# Patient Record
Sex: Male | Born: 1970 | Race: Black or African American | Hispanic: No | Marital: Single | Smoking: Current every day smoker
Health system: Southern US, Community
[De-identification: ages and names within clinical notes are randomized; demographics above are authoritative.]

## PROBLEM LIST (undated history)

## (undated) DIAGNOSIS — K219 Gastro-esophageal reflux disease without esophagitis: Secondary | ICD-10-CM

## (undated) DIAGNOSIS — I10 Essential (primary) hypertension: Secondary | ICD-10-CM

## (undated) DIAGNOSIS — E785 Hyperlipidemia, unspecified: Secondary | ICD-10-CM

## (undated) DIAGNOSIS — F1210 Cannabis abuse, uncomplicated: Secondary | ICD-10-CM

## (undated) HISTORY — PX: KNEE SURGERY: SHX244

---

## 2000-01-26 ENCOUNTER — Inpatient Hospital Stay (HOSPITAL_COMMUNITY): Admission: AD | Admit: 2000-01-26 | Discharge: 2000-01-31 | Payer: Self-pay | Admitting: *Deleted

## 2002-04-03 ENCOUNTER — Ambulatory Visit (HOSPITAL_COMMUNITY): Admission: RE | Admit: 2002-04-03 | Discharge: 2002-04-03 | Payer: Self-pay | Admitting: Preventative Medicine

## 2002-04-03 ENCOUNTER — Encounter: Payer: Self-pay | Admitting: Preventative Medicine

## 2002-07-31 ENCOUNTER — Ambulatory Visit (HOSPITAL_COMMUNITY): Admission: RE | Admit: 2002-07-31 | Discharge: 2002-07-31 | Payer: Self-pay | Admitting: Internal Medicine

## 2003-06-04 ENCOUNTER — Ambulatory Visit (HOSPITAL_COMMUNITY): Admission: RE | Admit: 2003-06-04 | Discharge: 2003-06-04 | Payer: Self-pay | Admitting: Orthopaedic Surgery

## 2003-06-15 ENCOUNTER — Ambulatory Visit (HOSPITAL_COMMUNITY): Admission: RE | Admit: 2003-06-15 | Discharge: 2003-06-15 | Payer: Self-pay | Admitting: Orthopaedic Surgery

## 2007-06-16 ENCOUNTER — Emergency Department (HOSPITAL_COMMUNITY): Admission: EM | Admit: 2007-06-16 | Discharge: 2007-06-16 | Payer: Self-pay | Admitting: Emergency Medicine

## 2009-05-13 ENCOUNTER — Encounter: Admission: RE | Admit: 2009-05-13 | Discharge: 2009-05-13 | Payer: Self-pay | Admitting: Gastroenterology

## 2009-05-19 ENCOUNTER — Encounter: Admission: RE | Admit: 2009-05-19 | Discharge: 2009-05-19 | Payer: Self-pay | Admitting: Gastroenterology

## 2009-06-02 ENCOUNTER — Ambulatory Visit (HOSPITAL_COMMUNITY): Admission: RE | Admit: 2009-06-02 | Discharge: 2009-06-02 | Payer: Self-pay | Admitting: Gastroenterology

## 2010-05-20 ENCOUNTER — Encounter: Payer: Self-pay | Admitting: Orthopaedic Surgery

## 2010-09-15 NOTE — H&P (Signed)
[facility]  Patient:    Danny Vargas, Danny Vargas                         MRN: [id number] Adm. Date:  [id number] Attending:  Otilio Saber Dictator:   Landry Corporal, N.P.                         History and Physical  ADDENDUM:  PHYSICAL EXAMINATION:  Head: Normocephalic, able to raise eyebrows.  Eyes are equal and reactive to light.  EOMs are intact.  Funduscopic exam: Difficult to visualize.  ENT: External ear canals are patent.  TMs are intact.  Nostrils are patent bilaterally, no sinus tenderness.  Mouth: Mucosa is moist, good dentition, no lesions were seen.  Tongue protrudes to midline without tremor. He can clench his teeth and puff out cheeks.  No pharyngeal exudate was noted. Neck is supple with full range of motion, no JVD, negative lymphadenopathy. Thyroid is nonpalpable, not enlarged.  Trachea is midline.  Respiratory: Clear to auscultation, no adventitious sounds.  Heart rate: Regular rate and rhythm, no murmurs heard.  Carotid pulses: Equal and adequate, no carotid bruits were auscultated.  Abdomen: Flat, soft, nontender abdomen, no masses or organomegaly, active bowel sounds in all four quadrants, no CVA tenderness. Muscular: No joint swelling or deformity.  Gait is normal.  Good range of motion.  Muscle strength and tone is equal bilaterally.  Skin is warm and dry with good turgor.  Nail beds are pink with good capillary refill.  Neurologic: Oriented x 3.  Cranial nerves are grossly intact.  Deep tendon reflexes are 2+.  Good grip strength bilaterally, no involuntary movement.  Gait is normal. Romberg is negative. DD:  01/29/00 TD:  01/29/00 Job: [id number]

## 2010-09-15 NOTE — H&P (Signed)
[facility]  Patient:    Danny Vargas, Danny Vargas                         MRN: [id number] Adm. Date:  [id number] Attending:  Otilio Saber Dictator:   Landry Corporal, N.P.                         History and Physical  INCOMPLETE REPORT  REASON FOR ADMISSION:  Admitted on 01/27/00 for detoxification of alcohol.  REVIEW OF SYSTEMS:  In general, the patient complains of no fevers or chills. Reports occasionally loses weight due to his diabetes.  Eyes: No blurred or double vision.  It has been several years since he has had an eye exam.  He does not wear glasses.  No glaucoma or cataract.  ENT and mouth: No earache, hearing loss, or vertigo.  Cardiovascular: No chest pain, palpitations, or pressure.  No syncope or sweating.  No history of hypertension.  He smokes one pack a day since the age of 73.  Respiratory: No cough, no shortness of breath, no dyspnea or orthopnea.  GI: No heartburn, hemorrhoids, blood in stool, or constipation.  GU: No dysuria, frequency, or hematuria.  No penile discharge.  Musculoskeletal: No stiffness, swelling, or joint pain.  Skin: No dryness, rash, or bruising.  Neurologic: No weakness, tremor, or memory loss. Psychiatric: Reports some depression.  Endocrine: History of diabetes type 1, on insulin for the past two years.  Lymph: No enlarged or tender lymph nodes. Allergies: No abnormal sneezing or runny nose.  PHYSICAL EXAMINATION:  Vital signs: Temperature 97, heart rate 84, respiratory rate 20, blood pressure 115/74, height 6 feet 2 inches, weight 147.5. General: A 40 year old black male sitting on the exam table, no acute distress, somewhat thin in stature but appears stated age.  Well groomed, alert, and cooperative. DD:  01/29/00 TD:  01/29/00 Job: [id number]

## 2010-09-15 NOTE — Discharge Summary (Signed)
[facility]  Patient:    Danny Vargas, Danny Vargas                         MRN: [id number] Adm. Date:  [id number] Disc. Date: [id number] Attending:  Otilio Vargas Dictator:   Danny Moloney, NP                           Discharge Summary  HISTORY OF PRESENT ILLNESS:  Mr. Danny Vargas is a 40 year old single black male admitted on a referral from [facility] with a history of alcohol dependence.  Patient reports drinking fairly heavily over the past four months, drinking up to several 40-oz beers a day, or up to a 12-pack a day.  He reports some occasional blackouts, but denies any seizures or hallucinations.  He was quite fearful of withdrawal symptoms, especially with his diabetes.  He denies sleep or appetite disturbance.  He has had some recent depression, but currently denies being depressed.  PAST PSYCHIATRIC HISTORY:  Patient was treated at [facility] in 1998.  Last detox October at [facility].  He apparently has not followed up with AA and no history of being on psychotropic medications.  PAST MEDICAL HISTORY:  Patients primary care physician is Dr. Valrie Vargas in [facility] [place].  Medical problems include diabetes mellitus type 2, with insulin.  Admission medications:  Lantis insulin 10 units a.c. breakfast and Humulin on a sliding scale.  He denied being on any other medications.  DRUG ALLERGIES:  No known drug allergies.  PHYSICAL EXAMINATION:  Review of systems was done in the hospital and it was negative, with no significant findings.  MENTAL STATUS EXAMINATION:  On admission, patient presents as a casually dressed, tall, thin black male.  Speech is normal.  Thought processes are logical and coherent without evidence of psychosis.  He denies suicidal ideation.  Mood is mildly depressed.  Affect is somewhat blunted but within normal range.  Oriented x 3.  Cognitive function is intact.  ADMITTING DIAGNOSES: Axis I:      Alcohol dependence. Axis II:    No diagnosis. Axis III:   Diabetes mellitus type 2. Axis IV:    Psychosocial stressors none. Axis V:     Global assessment of function 45, highest past year 70.  LABORATORY DATA:  CBC with diff was within normal limits, with the exception of white blood count increased at 11.3.  His CMET was within normal limits with the exception of glucose elevated at 395 and his ALP high at 123.  His thyroid profile was within normal limits.  His blood alcohol level was 133, elevated, and his urine drug screen was positive for cocaine.  His urinalysis showed a glucose of over 1000.  HOSPITAL COURSE:  The patient was admitted to the hospital for his alcohol dependence.  While he was in the hospital, he felt that he simply needed detox, and he reported doing fairly well today, sleeping well, no tremors. His blood sugars had been poorly controlled and patient was on a strict regime at home, and we did change the sliding scale and continued to detox him on a low dose Librium protocol and gave him Lantis insulin 10 units a.c. breakfast and with the note to not mix with a sliding scale.  Sliding scale was 100-150 for 2 units of regular, 150-200 three units of regular, 201-150 four units regular, 251-300 five units of regular.  Over 300 we would give him six units of regular insulin.  We did also consult with Dr. Cato Vargas for diabetes management and elevated blood sugars.  He was subsequently placed on a 2000 ADA diet with no concentrated sweets with a mid-morning snack and a late evening snack with diabetes teaching.  Again, we changed him to Lantis insulin and a new sliding scale was instituted.  The Lantis insulin was long acting and then we did have a sliding scale with Humalog and we also started on Actos 15 mg one p.o. q.d. and then also had a glucometer at his bedside and taught him how to use that.  While he was in the hospital, he did well on the detox. He had more  difficulty with his diabetes.  On day of discharge, he had no withdrawal symptoms.  He was sleeping and eating well.  Blood sugar still showed poor control.  Mood and affect were bright.  There were no suicidal thoughts.  It was felt that he could be managed safely on an outpatient basis.  CONDITION ON DISCHARGE:  Patient discharged in improved condition, with successful detox from alcohol.  His mood, sleep and appetite seemed improved. There was no suicidal ideation.  He seemed to have energy.  The main difficulty that he had was with getting his diabetes under control.  DISPOSITION:  Patient is discharged home.  FOLLOW UP:  Patient was to follow up at [facility] October 9 at 1:30, to see Danny Vargas.  He is also to schedule an appointment immediately with his doctor who treats his diabetes, ASAP, and we instructed him to go home on a 2000 Kcal ADA diet.  Patient was agreeable to treatment plan.  DISCHARGE MEDICATIONS: 1. Actos 15 one tablet daily. 2. Lantis insulin 10 units q.a.m. with a sliding scale insulin of Humulog,    151-200 two units, 201-250 four units, 251-300 six units, 301-350 eight    units, and 351-400 nine units.  FINAL DIAGNOSIS: Axis I:     Alcohol dependence. Axis II:    No diagnosis. Axis III:   Insulin-dependent diabetes mellitus. Axis IV:     Psychosocial stressors none. Axis V:     Global assessment of function current 55, highest past year 70. DD:  02/07/00 TD:  02/07/00 Job: [id number]

## 2010-09-15 NOTE — Consult Note (Signed)
NAME:  Danny Vargas, Danny Vargas                            ACCOUNT NO.:  [id number]   MEDICAL RECORD NO.:  [id number]                   PATIENT TYPE:   LOCATION:                                       FACILITY:   PHYSICIAN:  R. Roetta Sessions, M.D.              DATE OF BIRTH:  [birth year]   DATE OF CONSULTATION:  07/22/2002  DATE OF DISCHARGE:                                   CONSULTATION   REQUESTING PHYSICIAN:  Dr. Chilton Si   REASON FOR CONSULTATION:  Rectal bleeding.   HISTORY OF PRESENT ILLNESS:  [name] is a 40 year old black gentleman with  insulin-dependent diabetes mellitus who presents today for further  evaluation of rectal bleeding.  He has seen bright red blood per rectum  noted on the toilet tissue on several occasions.  The last time was about 4  weeks ago. He has had this off and on over the last year or so.  He had an  episode similar to this 8 years ago where he had a larger amount of  hematochezia.  He had an endoscopy (it sounds like a flexible sigmoidoscopy)  8 years ago and was told that he had hemorrhoids.  The patient states that  he occasionally has constipation.  When he has to strain he has noted some  bright red blood on the toilet tissue.  However, he usually has a bowel  movement daily.  Denies any diarrhea, melena, abdominal pain, nausea or  vomiting.  He rarely has heartburn.  He denies any dysphagia or odynophagia.  He says that his sugars are well-controlled.  He denies any weight loss.   CURRENT MEDICATIONS:  1. Lantus 18 units q.a.m.  2. NovoLog sliding scale insulin.  3. Floxin 1 b.i.d.   ALLERGIES:  No known drug allergies.   PAST MEDICAL HISTORY:  1. Insulin-dependent diabetes mellitus.  2. Currently being treated for prostatitis.  3. Was exposed to trichomonas.   FAMILY HISTORY:  Mother has thyroid disease.  Diabetes runs in his mother's  side of the family.  He has 6 aunt and uncles with diabetes.  No family  history of colorectal cancer or  inflammatory bowel disease.   SOCIAL HISTORY:  He is single.  He has 1 child.  He works for [employer].  He smokes 1 pack of cigarettes daily, has smoked for over 16 years.  Denies any alcohol use.   REVIEW OF SYSTEMS:  GENERAL:  Please see HPI for GI.  CARDIOPULMONARY:  Denies any shortness of breath or chest pain.   PHYSICAL EXAMINATION:  VITAL SIGNS:  Weight 153.  Height 6 feet 2 inches.  Temperature 97.6, blood pressure 110/70, pulse 66  GENERAL:  A pleasant, thin, black male, in no acute distress.  SKIN:  Warm and dry.  No jaundice.  HEENT:  Pupils are equal, round, and reacted to light.  Conjunctivae are  pink.  Sclerae anicteric.  Oropharyngeal mucosa moist and pink.  No lesions,  erythema or exudate.  No lymphadenopathy or thyromegaly.  CHEST:  Lungs are clear to auscultation.  CARDIOVASCULAR:  Cardiac exam reveals regular rate and rhythm.  Normal S1,  S2.  No murmurs, rubs, or gallops.  ABDOMEN:  Positive bowel sounds, flat, nondistended, nontender.  No  organomegaly or masses.  EXTREMITIES:  No edema.   IMPRESSION:  _________ hematochezia which may be from benign anorectal  source such as an anal fissure or hemorrhoids.  Given that he continues to  have intermittent rectal bleeding and probably and most likely has not had a  complete colonoscopy I have recommended one at this time.  I have discussed  the risks, alternatives, benefits with the patient and he is agreeable to  proceed.   PLAN:  1. Colonoscopy in the near future.  2. Further recommendations to follow.   I would like to thank to Dr. Chilton Si for allowing [place] to take part in the care  of this patient.     Tana Coast, Pricilla Larsson, M.D.    LL/MEDQ  D:  07/22/2002  T:  07/23/2002  Job:  [id number]   cc:   Dr. Chilton Si

## 2010-09-15 NOTE — Op Note (Signed)
NAME:  Danny Vargas, CIAVARELLA                          ACCOUNT NO.:  [id number]   MEDICAL RECORD NO.:  [id number]                   PATIENT TYPE:  AMB   LOCATION:  DAY                                  FACILITY:  [facility]   PHYSICIAN:  J. Darreld Mclean, M.D.              DATE OF BIRTH:  Apr 28, 1971   DATE OF PROCEDURE:  DATE OF DISCHARGE:                                 OPERATIVE REPORT   PREOPERATIVE DIAGNOSES:  Tear of the medial meniscus, right knee.   POSTOPERATIVE DIAGNOSES:  Tear of the medial meniscus, right knee.   PROCEDURE:  Operative arthroscopy, partial medial meniscectomy right knee.   ANESTHESIA:  General.   TOURNIQUET TIME:  20 minutes.   SURGEON:  J. Darreld Mclean, M.D.   ASSISTANT:  Candace Cruise, P.A.   DRAINS:  None.   INDICATIONS FOR PROCEDURE:  The patient is a 40 year old male with pain and  tenderness in his right knee.  MRI shows a tear of the medial meniscus of  the right knee.  The pain and tenderness in the knee is not improving.  Laverle Hobby, M.D. __________ the patient and I saw him on January 28.  Surgery was delayed pending results of MRI of his back. He was also having  significant back pain. Proceeding with the knee surgery risks and  imponderables were discussed with the patient preoperatively.   FINDINGS:  Suprapatellar pouch was normal and the surface of the patella was  normal medially.  The articular surfaces had grade 1 to grade 2 changes.  There was a tear in the posterior horn of the medial meniscus, stellate  tear, anterior cruciate was intact, laterally the articular surfaces looked  normal, there was no loose bodies.  The gutters were negative.   The patient was seen and the right knee was designated by him as the site we  were doing the surgery.  He put his initial, I put my initial which was  reconfirmed by the history and physical and other information in the  hospital record.   The patient was then taken to the operating room and  general anesthesia  given.  Supine on the operating room table, tourniquet placed, deflated left  upper thigh and a leg holder was attached. The patient was prepped and  draped in the usual manner. We reascertained we were doing on his right leg  arthroscopy.  The leg was elevated, wrapped circumferentially with esmarch  bandage, tourniquet inflated to 300 mmHg.  Esmarch bandage removed.  Inflow  cannula inserted, lactated Ringer's inserted to the knee by an infusion  pump. Arthroscope was inserted.  We had difficulty in viewing,  we could not  get the arthroscope focused at all.  Various maneuvers were done.  We  changed up the scope and the new scope worked and the first scope was later  examined and I was told before the case was over that there  was a problem  with the original scope that needed to be repaired. The second scope worked  very well and we could see extremely well with the high definition digital  equipment present.  Meniscal tear was readily identified please see in  findings above.  Permanent pictures were taken.  Using a meniscal punch,  meniscal shaver, good smooth contour was obtained, the meniscus was removed.  He was systematically reexamined, no new pathology found.  The wound was  reapproximated using  3-0 nylon in interrupted vertical mattress manner,  Marcaine 0.25 instilled into each portal.  The tourniquet deflated after 20  minutes, sterile dressing applied, bulky dressing applied, knee immobilizer  applied. The patient went to recovery in good condition.  A prescription of  Vicodin ES given for pain.  I will see him in the office in approximately 10  days to 2 weeks, physical therapy has been arranged.  If he has difficulties  he is to contact me through the office or hospital beeper system, numbers  have been provided.      ___________________________________________                                            Teola Bradley, M.D.   JWK/MEDQ  D:   06/15/2003  T:  06/15/2003  Job:  [id number]   cc:   Laverle Hobby, M.D.

## 2010-09-15 NOTE — Op Note (Signed)
NAME:  Danny Vargas, Danny Vargas                            ACCOUNT NO.:  [id number]   MEDICAL RECORD NO.:  [id number]                   PATIENT TYPE:  AMB   LOCATION:  DAY                                  FACILITY:  [facility]   PHYSICIAN:  R. Roetta Sessions, M.D.              DATE OF BIRTH:  Oct 16, 1970   DATE OF PROCEDURE:  07/31/2002  DATE OF DISCHARGE:                                  PROCEDURE NOTE   COLONOSCOPY   INDICATIONS FOR PROCEDURE:  The patient is a 40 year old gentleman with  hematochezia intermittently.  A colonoscopy is now being done to further  evaluate his symptoms.  Procedure has been discussed with the patient  previously and again today at the bedside the potential risks, benefits and  alternatives have been reviewed, questions answered and he is agreeable.  Please see my dictated H&P, 07/22/2002.   PROCEDURE NOTE:  O2 saturation, blood pressure, pulse and respirations were  monitored throughout.  Received conscious sedation with Versed 2 mg IV,  Demerol 50 mg IV.   INSTRUMENT:  Olympus video chip colonoscope.   FINDINGS:  Digital rectal examination revealed no abnormalities.   Endoscopic findings:  Prep was good.   Rectal and colon examination:  Rectal mucosa including retroflex view of  anal verge and __________ view of the anal canal demonstrated friable anal  canal hemorrhoids.  Otherwise, rectal mucosa appeared normal.   Colon:  The colonic mucosa was surveyed from the rectosigmoid junction,  through the left transverse, right colon to the area of appendiceal orifice,  ileocecal valve and cecum.  These structures were well seen.   The ileocecal valve appeared to be somewhat inflamed and friable.  There  were no erosions or ulcers.  It was biopsied x 2.  The terminal ileum was  embedded at 10 cm.  This segment of the GI tract appeared normal from this  level.  The scope was slowly withdrawn.  All previously-mentioned mucosal  surfaces were again seen.  No other  abnormalities were observed.  The  patient tolerated the procedure well.   ENDOSCOPY IMPRESSION:  1. Friable anal canal hemorrhoids, otherwise normal rectum.  2. Normal colon except for friable inflamed-appearing ileocecal valve of     doubtful clinical significance, biopsied.  Terminal ileum otherwise     appeared normal.   Suspect the patient had been bleeding from hemorrhoids.   RECOMMENDATIONS:  1. Hemorrhoid literature provided to [name].  2.     A 10-day course of Anusol suppositories, Crystallose 20 grams orally daily     on a p.r.n. basis for constipation.  3. Follow up on path.  4. Further recommendations to follow.  [name], M.D.    RMR/MEDQ  D:  07/31/2002  T:  08/01/2002  Job:  [id number]   cc:   Dr. Chilton Si

## 2010-09-15 NOTE — H&P (Signed)
[facility]  Patient:    Danny Vargas, Danny Vargas                         MRN: [id number] Adm. Date:  [id number] Attending:  Otilio Saber                         History and Physical  IDENTIFYING DATA:  Danny Vargas is a 40 year old single black male admitted on referral from [facility] with a history of alcohol dependence.  HISTORY OF PRESENT ILLNESS:  Patient reports drinking fairly heavily over the past four months, drinking up to several 40-ounce beers a day or up to a 12-pack a day.  He reports some occasional blackout spells but denies any seizures or hallucinations.  He was quite fearful of withdrawal symptoms, especially with his diabetes.  He denies sleep or appetite disturbance.  He has had some recent depression, but currently denies being depressed.  PAST PSYCHIATRIC HISTORY:  The patient was treated at [facility] in 1998.  He was last detoxed in October of 2000 at [facility].  He states that he did not follow up with AA.  He has been on no psychotropic medications. PAST MEDICAL HISTORY:  The patient is followed by Dr. Valrie Hart in [place], [place].  He has had diabetes for the past two years and is insulin dependent.  He is on Mentis insulin 10 units a.c. breakfast and Humulin on a sliding scale.  Patient denies being on any other medications.  He has known drug allergies.  SOCIAL HISTORY:  The patient has never married, but has a three-year-old son who stays with him.  He works in Designer, fashion/clothing. FAMILY HISTORY:  The patients maternal uncle has a history of alcoholism. His maternal grandfather reportedly was alcoholic and there is some question as to whether his paternal grandfather was also alcoholic.  REVIEW OF SYSTEMS AND PHYSICAL EXAMINATION:  Pending.  MENTAL STATUS EXAMINATION:  Patient presents as a casually dressed, tall, thin black male.  Speech is normal  Thought processes are logical and coherent without  evidence of psychosis.  He denies suicidal ideation.  Mood is mildly depressed.  Affect is somewhat blunted but with normal range.  Oriented x 3. Cognitive function is intact.  ADMITTING DIAGNOSES: Axis I:     1. Alcohol dependence. Axis II:    No diagnosis. Axis III:   Insulin-dependent diabetes mellitus. Axis IV:    Psychosocial stressors none. Axis V:     Global assessment of function currently 45, highest past year 70.  TREATMENT PLAN:  The patient will be detoxified with a low dose Librium protocol.  We need to look into outpatient rehabilitation following detox. DD:  01/28/00 TD:  01/28/00 Job: [id number]

## 2011-01-19 LAB — COMPREHENSIVE METABOLIC PANEL
ALT: 13
Alkaline Phosphatase: 110
CO2: 33 — ABNORMAL HIGH
GFR calc non Af Amer: >60
Glucose, Bld: 264 — ABNORMAL HIGH
Potassium: 4.3
Sodium: 136

## 2011-01-19 LAB — URINALYSIS, ROUTINE W REFLEX MICROSCOPIC
Glucose, UA: >1000 — AB
Ketones, ur: NEGATIVE
Leukocytes, UA: NEGATIVE
pH: 6

## 2011-01-19 LAB — CBC
Hemoglobin: 16.3
RBC: 5.56

## 2011-01-19 LAB — DIFFERENTIAL
Basophils Relative: 1
Eosinophils Absolute: 0.2
Monocytes Relative: 8
Neutrophils Relative %: 71

## 2011-01-19 LAB — URINE MICROSCOPIC-ADD ON: Urine-Other: NONE SEEN

## 2012-01-01 ENCOUNTER — Emergency Department (HOSPITAL_COMMUNITY)
Admission: EM | Admit: 2012-01-01 | Discharge: 2012-01-01 | Disposition: A | Discharge status: Home / Self Care | Payer: BC Managed Care – PPO | Attending: Emergency Medicine | Admitting: Emergency Medicine

## 2012-01-01 ENCOUNTER — Encounter (HOSPITAL_COMMUNITY): Payer: Self-pay | Admitting: *Deleted

## 2012-01-01 ENCOUNTER — Emergency Department (HOSPITAL_COMMUNITY): Payer: BC Managed Care – PPO

## 2012-01-01 DIAGNOSIS — Z794 Long term (current) use of insulin: Secondary | ICD-10-CM | POA: Insufficient documentation

## 2012-01-01 DIAGNOSIS — R109 Unspecified abdominal pain: Secondary | ICD-10-CM

## 2012-01-01 DIAGNOSIS — E119 Type 2 diabetes mellitus without complications: Secondary | ICD-10-CM | POA: Insufficient documentation

## 2012-01-01 DIAGNOSIS — R739 Hyperglycemia, unspecified: Secondary | ICD-10-CM

## 2012-01-01 DIAGNOSIS — R112 Nausea with vomiting, unspecified: Secondary | ICD-10-CM | POA: Insufficient documentation

## 2012-01-01 DIAGNOSIS — K219 Gastro-esophageal reflux disease without esophagitis: Secondary | ICD-10-CM | POA: Insufficient documentation

## 2012-01-01 DIAGNOSIS — Z79899 Other long term (current) drug therapy: Secondary | ICD-10-CM | POA: Insufficient documentation

## 2012-01-01 DIAGNOSIS — F17200 Nicotine dependence, unspecified, uncomplicated: Secondary | ICD-10-CM | POA: Insufficient documentation

## 2012-01-01 HISTORY — DX: Gastro-esophageal reflux disease without esophagitis: K21.9

## 2012-01-01 LAB — URINALYSIS, ROUTINE W REFLEX MICROSCOPIC
Glucose, UA: >1000 mg/dL — AB
Hgb urine dipstick: NEGATIVE
Leukocytes, UA: NEGATIVE
Specific Gravity, Urine: 1.025 (ref 1.005–1.030)
pH: 6 (ref 5.0–8.0)

## 2012-01-01 LAB — COMPREHENSIVE METABOLIC PANEL
AST: 11 U/L (ref 0–37)
CO2: 32 mEq/L (ref 19–32)
Calcium: 9.5 mg/dL (ref 8.4–10.5)
Creatinine, Ser: 0.86 mg/dL (ref 0.50–1.35)
GFR calc non Af Amer: >90 mL/min (ref >90)

## 2012-01-01 LAB — CBC WITH DIFFERENTIAL/PLATELET
Eosinophils Absolute: 0.8 10*3/uL — ABNORMAL HIGH (ref 0.0–0.7)
Hemoglobin: 14.3 g/dL (ref 13.0–17.0)
Lymphocytes Relative: 31 % (ref 12–46)
Lymphs Abs: 3.3 10*3/uL (ref 0.7–4.0)
MCH: 28.7 pg (ref 26.0–34.0)
Monocytes Relative: 5 % (ref 3–12)
Neutro Abs: 5.9 10*3/uL (ref 1.7–7.7)
Neutrophils Relative %: 55 % (ref 43–77)
RBC: 4.98 MIL/uL (ref 4.22–5.81)
WBC: 10.6 10*3/uL — ABNORMAL HIGH (ref 4.0–10.5)

## 2012-01-01 LAB — GLUCOSE, CAPILLARY: Glucose-Capillary: 245 mg/dL — ABNORMAL HIGH (ref 70–99)

## 2012-01-01 LAB — URINE MICROSCOPIC-ADD ON

## 2012-01-01 LAB — LIPASE, BLOOD: Lipase: 28 U/L (ref 11–59)

## 2012-01-01 MED ORDER — PROMETHAZINE HCL 25 MG PO TABS: 25.0000 mg | ORAL_TABLET | Freq: Four times a day (QID) | ORAL | Status: DC | PRN

## 2012-01-01 MED ORDER — IOHEXOL 300 MG/ML  SOLN
100.0000 mL | Freq: Once | INTRAMUSCULAR | Status: AC | PRN
  Administered 2012-01-01: 100 mL via INTRAVENOUS

## 2012-01-01 MED ORDER — SODIUM CHLORIDE 0.9 % IV SOLN: INTRAVENOUS | Status: DC

## 2012-01-01 MED ORDER — HYDROMORPHONE HCL PF 1 MG/ML IJ SOLN
1.0000 mg | Freq: Once | INTRAMUSCULAR | Status: AC
  Administered 2012-01-01: 1 mg via INTRAVENOUS
  Filled 2012-01-01: qty 1

## 2012-01-01 MED ORDER — ONDANSETRON HCL 4 MG/2ML IJ SOLN
4.0000 mg | Freq: Once | INTRAMUSCULAR | Status: AC
  Administered 2012-01-01: 4 mg via INTRAVENOUS
  Filled 2012-01-01: qty 2

## 2012-01-01 MED ORDER — HYDROCODONE-ACETAMINOPHEN 5-325 MG PO TABS: 1.0000 | ORAL_TABLET | Freq: Four times a day (QID) | ORAL | Status: AC | PRN

## 2012-01-01 MED ORDER — SODIUM CHLORIDE 0.9 % IV BOLUS (SEPSIS)
1000.0000 mL | Freq: Once | INTRAVENOUS | Status: AC
  Administered 2012-01-01: 1000 mL via INTRAVENOUS

## 2012-01-01 NOTE — ED Notes (Addendum)
Pt c/o acid indigestion, stomach doesn;t feel right, n/v for a week, diarrhea yesterday, denies any  fever. CBG at triage 245

## 2012-01-01 NOTE — ED Provider Notes (Addendum)
History     CSN: [id number]  Arrival date & time 01/01/12  1720   First MD Initiated Contact with Patient 01/01/12 1905      Chief Complaint  Patient presents with  . Abdominal Pain  . Nausea  . Emesis    (Consider location/radiation/quality/duration/timing/severity/associated sxs/prior treatment) Patient is a 41 y.o. male presenting with abdominal pain and vomiting. The history is provided by the patient.  Abdominal Pain The primary symptoms of the illness include abdominal pain, nausea and vomiting. The primary symptoms of the illness do not include fever, shortness of breath, diarrhea or dysuria. The current episode started more than 2 days ago. The onset of the illness was sudden. The problem has not changed since onset. The abdominal pain began more than 2 days ago (one week ago.). The abdominal pain has been unchanged since its onset. The abdominal pain is located in the periumbilical region. The abdominal pain does not radiate. The severity of the abdominal pain is 6/10.  Symptoms associated with the illness do not include back pain.  Emesis  Associated symptoms include abdominal pain. Pertinent negatives include no diarrhea, no fever and no headaches.    Past Medical History  Diagnosis Date  . GERD (gastroesophageal reflux disease)   . Diabetes mellitus     Past Surgical History  Procedure Date  . Knee surgery     No family history on file.  History  Substance Use Topics  . Smoking status: Current Everyday Smoker  . Smokeless tobacco: Not on file  . Alcohol Use: No      Review of Systems  Constitutional: Negative for fever.  HENT: Negative for neck pain.   Eyes: Negative for redness.  Respiratory: Negative for shortness of breath.   Cardiovascular: Negative for chest pain.  Gastrointestinal: Positive for nausea, vomiting and abdominal pain. Negative for diarrhea.  Genitourinary: Negative for dysuria.  Musculoskeletal: Negative for back pain.  Skin:  Negative for rash.  Neurological: Negative for headaches.  Hematological: Does not bruise/bleed easily.    Allergies  Peanut-containing drug products  Home Medications   Current Outpatient Rx  Name Route Sig Dispense Refill  . ASPIRIN EC 81 MG PO TBEC Oral Take 81 mg by mouth every other day.    . INSULIN ASPART 100 UNIT/ML [place] SOLN Subcutaneous Inject into the skin 3 (three) times daily before meals. As directed per sliding scale instructions. Per patient: If levels are:  100-150= 2 units 151-200=3 units 201-250=4 units, and so forth as directed    . INSULIN GLARGINE 100 UNIT/ML [place] SOLN Subcutaneous Inject 15 Units into the skin at bedtime.    Marland Kitchen LISINOPRIL 5 MG PO TABS Oral Take 5 mg by mouth daily.    Marland Kitchen LOVASTATIN 20 MG PO TABS Oral Take 20 mg by mouth daily.    Marland Kitchen HYDROCODONE-ACETAMINOPHEN 5-325 MG PO TABS Oral Take 1-2 tablets by mouth every 6 (six) hours as needed for pain. 10 tablet 0  . PROMETHAZINE HCL 25 MG PO TABS Oral Take 1 tablet (25 mg total) by mouth every 6 (six) hours as needed for nausea. 12 tablet 0    BP 103/76  Pulse 100  Temp 98.7 F (37.1 C)  Resp 18  Ht 6\' 1"  (1.854 m)  Wt 145 lb (65.772 kg)  BMI 19.13 kg/m2  SpO2 100%  Physical Exam  Nursing note and vitals reviewed. Constitutional: He is oriented to person, place, and time. He appears well-developed and well-nourished.  HENT:  Head: Normocephalic and  atraumatic.  Mouth/Throat: Oropharynx is clear and moist.  Eyes: Conjunctivae and EOM are normal. Pupils are equal, round, and reactive to light.  Neck: Normal range of motion. Neck supple.  Cardiovascular: Normal rate, regular rhythm and normal heart sounds.   No murmur heard. Pulmonary/Chest: Effort normal and breath sounds normal.  Abdominal: Soft. Bowel sounds are normal. He exhibits no mass. There is no tenderness.       Bilaterally below the umbilical area patient has 2 raised areas where he injects his insulin they're not infected there is no  mass directly inferior to the umbilicus.  Musculoskeletal: Normal range of motion. He exhibits no edema and no tenderness.  Neurological: He is alert and oriented to person, place, and time. No cranial nerve deficit. He exhibits normal muscle tone.  Skin: Skin is warm. No rash noted.    ED Course  Procedures (including critical care time)  Labs Reviewed  GLUCOSE, CAPILLARY - Abnormal; Notable for the following:    Glucose-Capillary 245 (*)     All other components within normal limits  COMPREHENSIVE METABOLIC PANEL - Abnormal; Notable for the following:    Glucose, Bld 294 (*)     Albumin 3.3 (*)     Alkaline Phosphatase 150 (*)     Total Bilirubin 0.2 (*)     All other components within normal limits  CBC WITH DIFFERENTIAL - Abnormal; Notable for the following:    WBC 10.6 (*)     Eosinophils Relative 8 (*)     Eosinophils Absolute 0.8 (*)     All other components within normal limits  URINALYSIS, ROUTINE W REFLEX MICROSCOPIC - Abnormal; Notable for the following:    Glucose, UA >1000 (*)     All other components within normal limits  LIPASE, BLOOD  URINE MICROSCOPIC-ADD ON   Ct Abdomen Pelvis W Contrast  01/01/2012  *RADIOLOGY REPORT*  Clinical Data: Upper abdominal pain with nausea and vomiting for 1 week.  History of diabetes.  CT ABDOMEN AND PELVIS WITH CONTRAST  Technique:  Multidetector CT imaging of the abdomen and pelvis was performed following the standard protocol during bolus administration of intravenous contrast.  Contrast: OMNIPAQUE IOHEXOL 300 MG/ML  SOLN  Comparison: Abdominal pelvic CT 05/19/2009.  Findings: The lung bases are clear.  There is no pleural effusion.  There is a paucity of intra-abdominal fat.  The liver appears unremarkable.  The hepatic veins are not yet opacified.  The spleen, gallbladder, pancreas, adrenal glands and kidneys appear normal.  No enlarged abdominal lymph nodes are seen.  There is focal common iliac artery atherosclerosis asymmetric  to the left. The distal small bowel and colon are not yet opacified with contrast.  Given the paucity of intra-abdominal fat, the appendix is difficult to confidently identify.  However, based on the prior study, is probably seen on axial image 64 and coronal image 32, containing air.  The bladder appears normal.  There is a subcutaneous nodule in the right infraumbilical region which appears new.  No hernia is identified.  Osseous structures appear normal.  IMPRESSION:  1.  No definite acute abdominal pelvic findings.  The appendix is not well visualized; however, no pericecal inflammatory changes are evident. 2.  Subcutaneous nodule in the right infraumbilical region may represent focal inflammation, prior injection or keloid.  Correlate clinically.   Original Report Authenticated By: Gerrianne Scale, M.D.    Results for orders placed during the hospital encounter of 01/01/12  GLUCOSE, CAPILLARY  Component Value Range   Glucose-Capillary 245 (*) 70 - 99 mg/dL  LIPASE, BLOOD      Component Value Range   Lipase 28  11 - 59 U/L  COMPREHENSIVE METABOLIC PANEL      Component Value Range   Sodium 135  135 - 145 mEq/L   Potassium 4.1  3.5 - 5.1 mEq/L   Chloride 98  96 - 112 mEq/L   CO2 32  19 - 32 mEq/L   Glucose, Bld 294 (*) 70 - 99 mg/dL   BUN 9  6 - 23 mg/dL   Creatinine, Ser 1.61  0.50 - 1.35 mg/dL   Calcium 9.5  8.4 - 09.6 mg/dL   Total Protein 6.7  6.0 - 8.3 g/dL   Albumin 3.3 (*) 3.5 - 5.2 g/dL   AST 11  0 - 37 U/L   ALT 9  0 - 53 U/L   Alkaline Phosphatase 150 (*) 39 - 117 U/L   Total Bilirubin 0.2 (*) 0.3 - 1.2 mg/dL   GFR calc non Af Amer >90  >90 mL/min   GFR calc Af Amer >90  >90 mL/min  CBC WITH DIFFERENTIAL      Component Value Range   WBC 10.6 (*) 4.0 - 10.5 K/uL   RBC 4.98  4.22 - 5.81 MIL/uL   Hemoglobin 14.3  13.0 - 17.0 g/dL   HCT 04.5  40.9 - 81.1 %   MCV 84.7  78.0 - 100.0 fL   MCH 28.7  26.0 - 34.0 pg   MCHC 33.9  30.0 - 36.0 g/dL   RDW 91.4  78.2 - 95.6 %     Platelets 254  150 - 400 K/uL   Neutrophils Relative 55  43 - 77 %   Neutro Abs 5.9  1.7 - 7.7 K/uL   Lymphocytes Relative 31  12 - 46 %   Lymphs Abs 3.3  0.7 - 4.0 K/uL   Monocytes Relative 5  3 - 12 %   Monocytes Absolute 0.5  0.1 - 1.0 K/uL   Eosinophils Relative 8 (*) 0 - 5 %   Eosinophils Absolute 0.8 (*) 0.0 - 0.7 K/uL   Basophils Relative 1  0 - 1 %   Basophils Absolute 0.1  0.0 - 0.1 K/uL  URINALYSIS, ROUTINE W REFLEX MICROSCOPIC      Component Value Range   Color, Urine YELLOW  YELLOW   APPearance CLEAR  CLEAR   Specific Gravity, Urine 1.025  1.005 - 1.030   pH 6.0  5.0 - 8.0   Glucose, UA >1000 (*) NEGATIVE mg/dL   Hgb urine dipstick NEGATIVE  NEGATIVE   Bilirubin Urine NEGATIVE  NEGATIVE   Ketones, ur NEGATIVE  NEGATIVE mg/dL   Protein, ur NEGATIVE  NEGATIVE mg/dL   Urobilinogen, UA 0.2  0.0 - 1.0 mg/dL   Nitrite NEGATIVE  NEGATIVE   Leukocytes, UA NEGATIVE  NEGATIVE  URINE MICROSCOPIC-ADD ON      Component Value Range   Squamous Epithelial / LPF RARE  RARE   WBC, UA 0-2  <3 WBC/hpf   Bacteria, UA RARE  RARE     1. Abdominal pain   2. Hyperglycemia       MDM   Patient with known history of diabetes on insulin. Patient presented for abdominal pain nausea and vomiting all pain was periumbilical started on a week ago last Wednesday vomited started more recently just a few days ago no diarrhea. CT scan of the abdomen is negative. Blood  sugar elevated but below 300 .No signs of metabolic acidosis no renal insufficiency no significant leukocytosis. Patient can be discharged home he did receive 1 L of normal saline here which will help with the elevated blood sugar. Prescriptions given for Phenergan and pain medicine.  Resource guide provided to help him find a primary care Dr. Roxana Hires now that he is in this area from the [place] area.        Shelda Jakes, MD 01/01/12 1610  Shelda Jakes, MD 01/02/12 [phone]

## 2012-01-01 NOTE — ED Notes (Addendum)
Pt c/o abdominal pain since last Wednesday. Pt denies NVD and pain anywhere else. Pt states pain is at its worst in the morning. Pt states he feels weak and "weird".

## 2012-09-10 ENCOUNTER — Other Ambulatory Visit: Payer: Self-pay | Admitting: Gastroenterology

## 2012-09-10 DIAGNOSIS — R112 Nausea with vomiting, unspecified: Secondary | ICD-10-CM

## 2012-09-19 ENCOUNTER — Other Ambulatory Visit: Payer: BC Managed Care – PPO

## 2012-09-23 ENCOUNTER — Ambulatory Visit
Admission: RE | Admit: 2012-09-23 | Discharge: 2012-09-23 | Disposition: A | Discharge status: Home / Self Care | Payer: BC Managed Care – PPO | Source: Ambulatory Visit | Attending: Gastroenterology | Admitting: Gastroenterology

## 2012-09-23 DIAGNOSIS — R112 Nausea with vomiting, unspecified: Secondary | ICD-10-CM

## 2013-08-14 IMAGING — CT CT ABD-PELV W/ CM
2 of 5 series · 15 of 46 positions shown, 17 images · IV contrast (Omnipaque 300)
Comparison: Abdominal pelvic CT 05/19/2009.

CLINICAL DATA: Upper abdominal pain with nausea and vomiting for 1
week.  History of diabetes.

CT ABDOMEN AND PELVIS WITH CONTRAST
TECHNIQUE: Multidetector CT imaging of the abdomen and pelvis was
performed following the standard protocol during bolus
administration of intravenous contrast.
Contrast: 100mL OMNIPAQUE IOHEXOL 300 MG/ML  SOLN

[Series 2: abd_pel_with 5.0 b40f · axial · 0.62mm/px · z∈[-524,-114]mm · 12 of 94 slices shown, 14 images]
[im 6/94  soft-tissue]
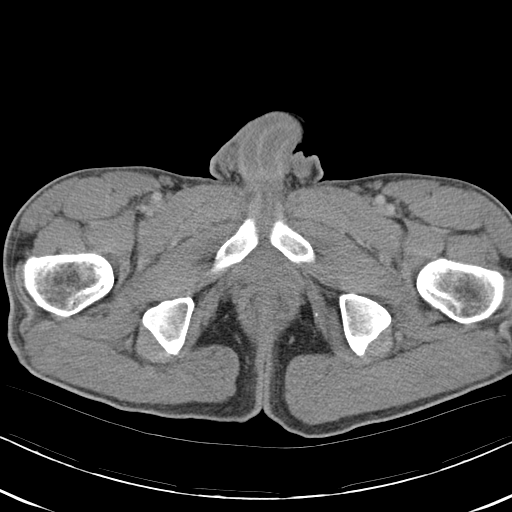
[im 6/94  bone]
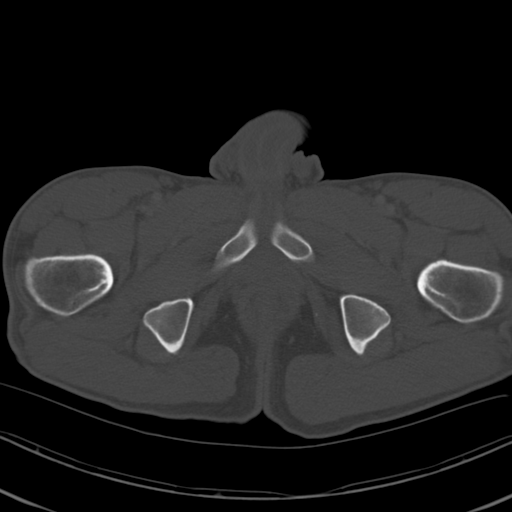
[im 17/94  soft-tissue]
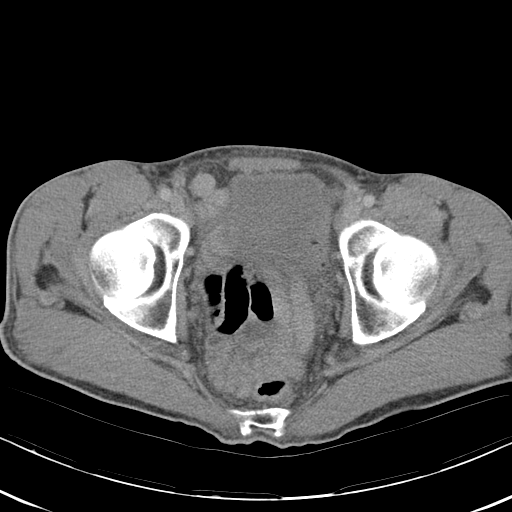
[im 22/94  soft-tissue]
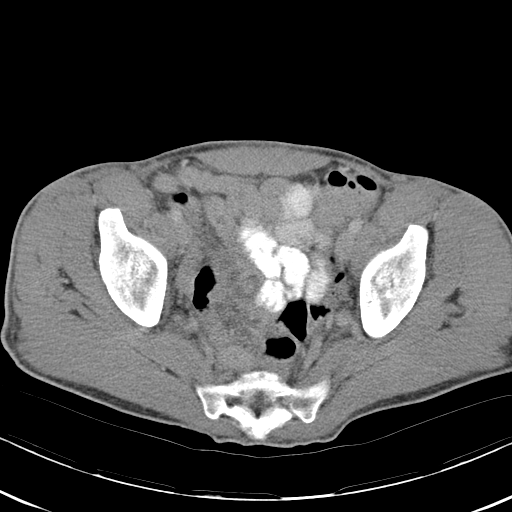
[im 28/94  soft-tissue]
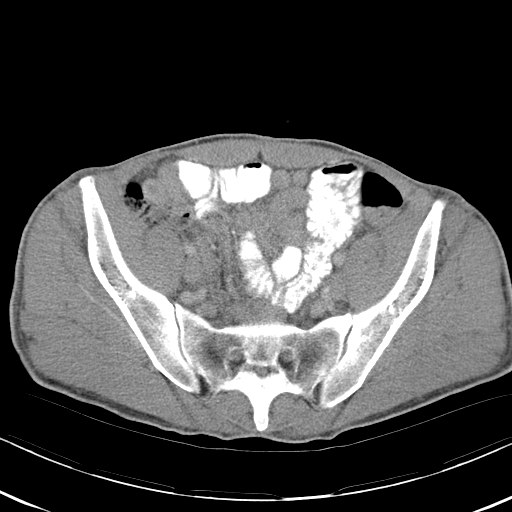
[im 39/94  soft-tissue]
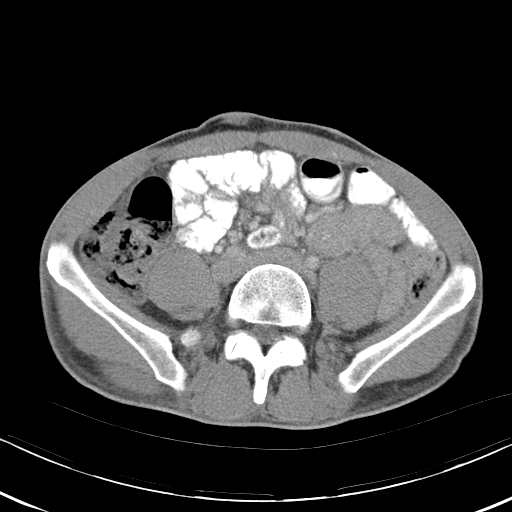
[im 44/94  soft-tissue]
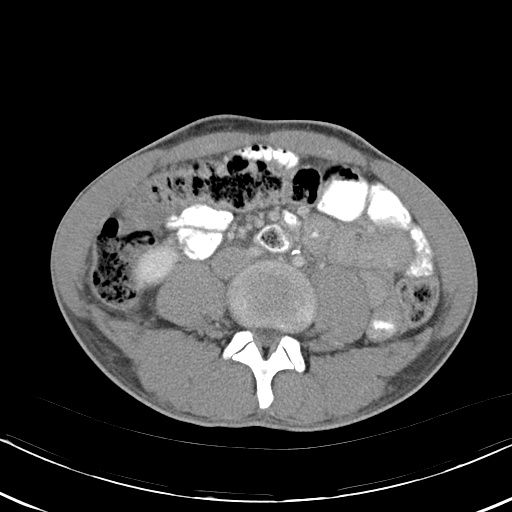
[im 50/94  soft-tissue]
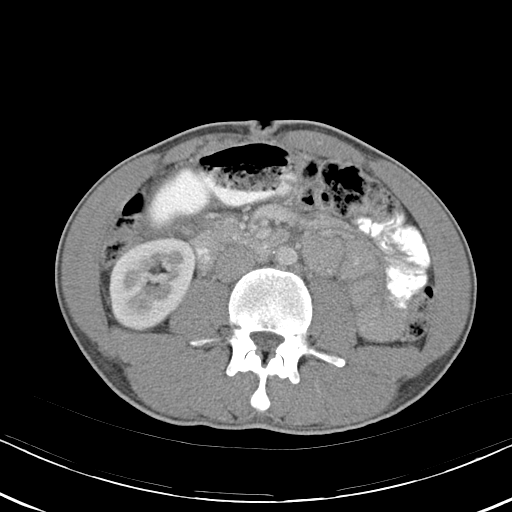
[im 61/94  soft-tissue]
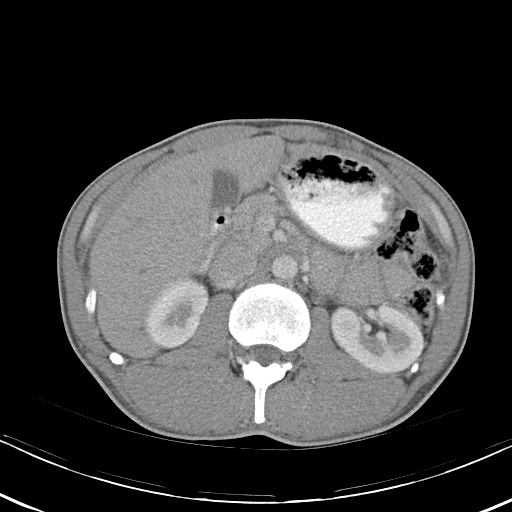
[im 66/94  soft-tissue]
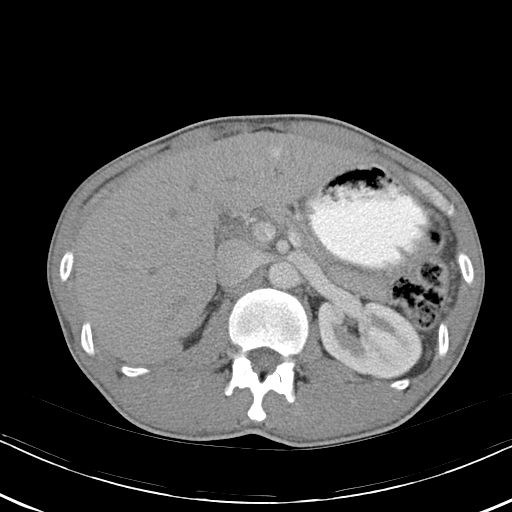
[im 66/94  bone]
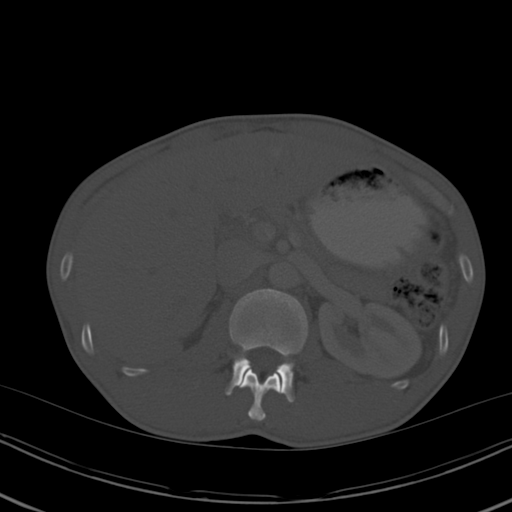
[im 72/94  soft-tissue]
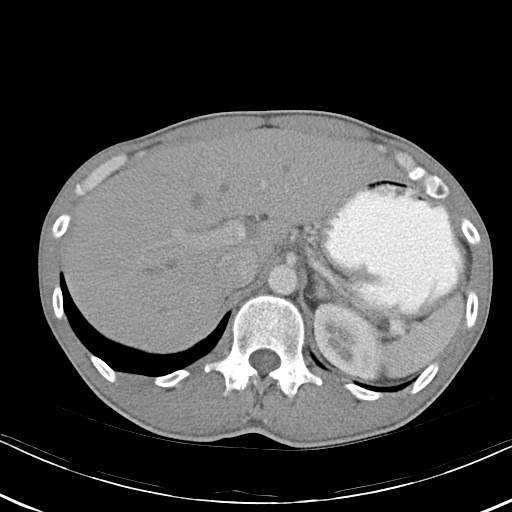
[im 83/94  soft-tissue]
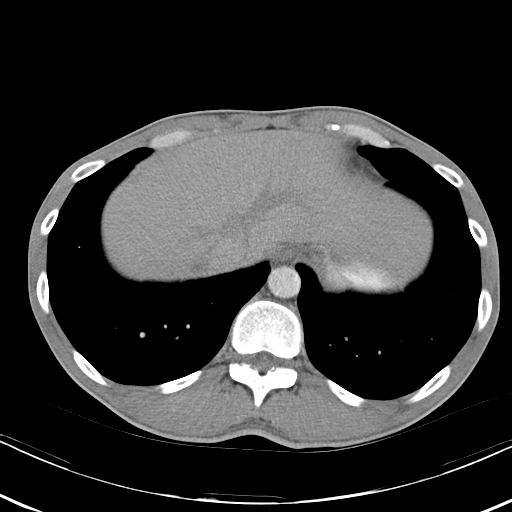
[im 88/94  soft-tissue]
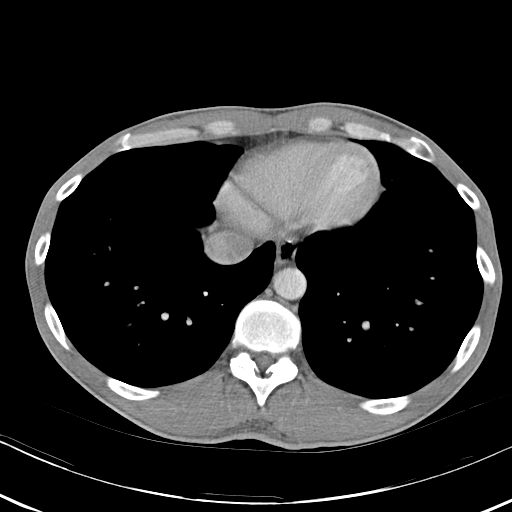

[Series 4: abd_pel_with 3.0 spo cor · coronal · 0.62mm/px · 3 of 69 slices shown]
[im 23/69  soft-tissue]
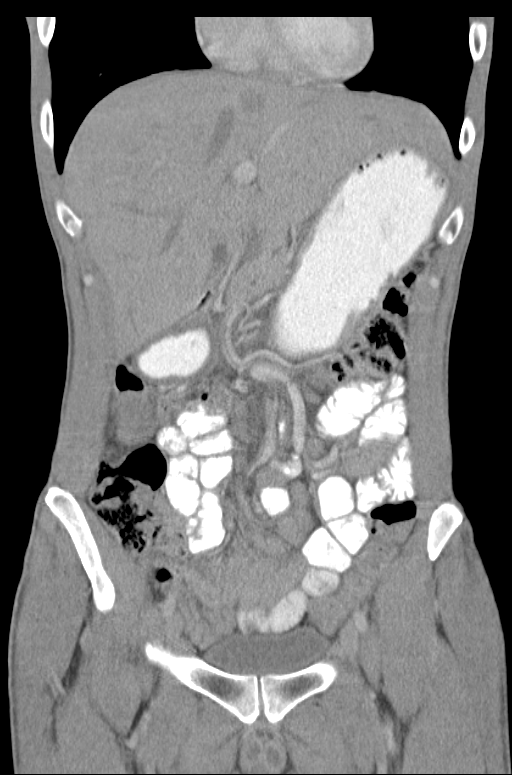
[im 31/69  soft-tissue]
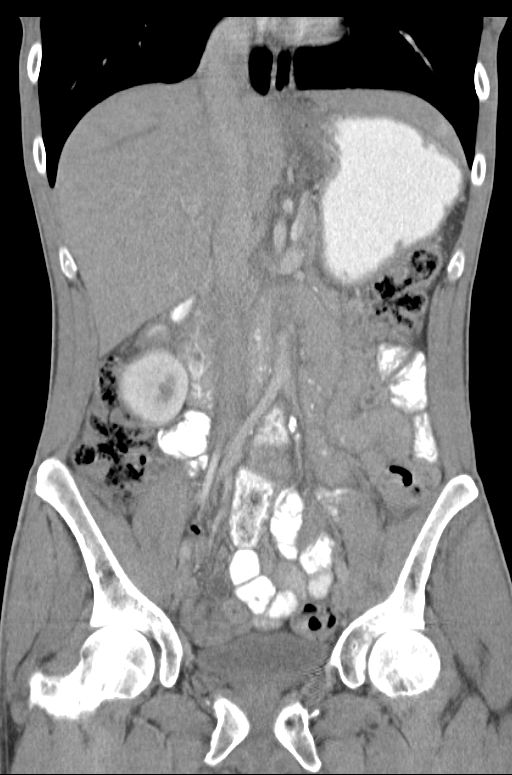
[im 38/69  soft-tissue]
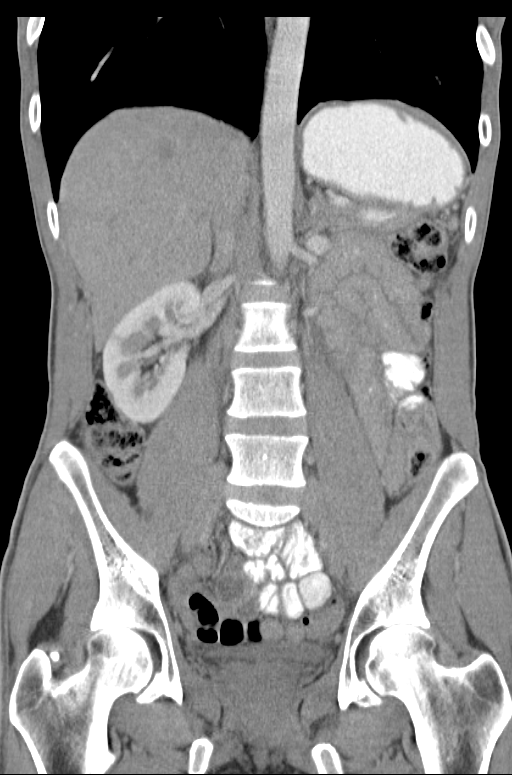

[15 of 46 positions shown; findings below may reference images not displayed]

FINDINGS: The lung bases are clear.  There is no pleural effusion.

There is a paucity of intra-abdominal fat.  The liver appears
unremarkable.  The hepatic veins are not yet opacified.  The
spleen, gallbladder, pancreas, adrenal glands and kidneys appear
normal.

No enlarged abdominal lymph nodes are seen.  There is focal common
iliac artery atherosclerosis asymmetric to the left. The distal
small bowel and colon are not yet opacified with contrast.  Given
the paucity of intra-abdominal fat, the appendix is difficult to
confidently identify.  However, based on the prior study, is
probably seen on axial image 64 and coronal image 32, containing
air.

The bladder appears normal.  There is a subcutaneous nodule in the
right infraumbilical region which appears new.  No hernia is
identified.  Osseous structures appear normal.
IMPRESSION: 1.  No definite acute abdominal pelvic findings.  The appendix is
not well visualized; however, no pericecal inflammatory changes are
evident.
2.  Subcutaneous nodule in the right infraumbilical region may
represent focal inflammation, prior injection or keloid.  Correlate
clinically.

## 2013-10-28 ENCOUNTER — Inpatient Hospital Stay (HOSPITAL_COMMUNITY)
Admission: EM | Admit: 2013-10-28 | Discharge: 2013-11-01 | DRG: 639 | Disposition: A | Discharge status: Home / Self Care | Payer: BC Managed Care – PPO | Attending: Internal Medicine | Admitting: Internal Medicine

## 2013-10-28 ENCOUNTER — Emergency Department (HOSPITAL_COMMUNITY): Payer: BC Managed Care – PPO

## 2013-10-28 ENCOUNTER — Encounter (HOSPITAL_COMMUNITY): Payer: Self-pay | Admitting: Emergency Medicine

## 2013-10-28 DIAGNOSIS — I10 Essential (primary) hypertension: Secondary | ICD-10-CM

## 2013-10-28 DIAGNOSIS — R1110 Vomiting, unspecified: Secondary | ICD-10-CM

## 2013-10-28 DIAGNOSIS — E1110 Type 2 diabetes mellitus with ketoacidosis without coma: Secondary | ICD-10-CM

## 2013-10-28 DIAGNOSIS — F17200 Nicotine dependence, unspecified, uncomplicated: Secondary | ICD-10-CM | POA: Diagnosis present

## 2013-10-28 DIAGNOSIS — E876 Hypokalemia: Secondary | ICD-10-CM

## 2013-10-28 DIAGNOSIS — R1115 Cyclical vomiting syndrome unrelated to migraine: Secondary | ICD-10-CM

## 2013-10-28 DIAGNOSIS — R739 Hyperglycemia, unspecified: Secondary | ICD-10-CM

## 2013-10-28 DIAGNOSIS — Z9641 Presence of insulin pump (external) (internal): Secondary | ICD-10-CM

## 2013-10-28 DIAGNOSIS — E785 Hyperlipidemia, unspecified: Secondary | ICD-10-CM

## 2013-10-28 DIAGNOSIS — Z794 Long term (current) use of insulin: Secondary | ICD-10-CM

## 2013-10-28 DIAGNOSIS — E119 Type 2 diabetes mellitus without complications: Secondary | ICD-10-CM

## 2013-10-28 DIAGNOSIS — K219 Gastro-esophageal reflux disease without esophagitis: Secondary | ICD-10-CM

## 2013-10-28 DIAGNOSIS — K3184 Gastroparesis: Secondary | ICD-10-CM

## 2013-10-28 DIAGNOSIS — R112 Nausea with vomiting, unspecified: Secondary | ICD-10-CM

## 2013-10-28 DIAGNOSIS — E1010 Type 1 diabetes mellitus with ketoacidosis without coma: Secondary | ICD-10-CM

## 2013-10-28 DIAGNOSIS — F1210 Cannabis abuse, uncomplicated: Secondary | ICD-10-CM | POA: Diagnosis present

## 2013-10-28 DIAGNOSIS — D72829 Elevated white blood cell count, unspecified: Secondary | ICD-10-CM | POA: Diagnosis present

## 2013-10-28 LAB — CBG MONITORING, ED
Glucose-Capillary: 358 mg/dL — ABNORMAL HIGH (ref 70–99)
Glucose-Capillary: 311 mg/dL — ABNORMAL HIGH (ref 70–99)
Glucose-Capillary: 340 mg/dL — ABNORMAL HIGH (ref 70–99)

## 2013-10-28 LAB — CBC WITH DIFFERENTIAL/PLATELET
BASOS ABS: 0 10*3/uL (ref 0.0–0.1)
Basophils Relative: 0 % (ref 0–1)
EOS ABS: 0 10*3/uL (ref 0.0–0.7)
Eosinophils Relative: 0 % (ref 0–5)
HEMATOCRIT: 43.3 % (ref 39.0–52.0)
Hemoglobin: 14.4 g/dL (ref 13.0–17.0)
Lymphocytes Relative: 10 % — ABNORMAL LOW (ref 12–46)
Lymphs Abs: 1.2 10*3/uL (ref 0.7–4.0)
MCH: 28.3 pg (ref 26.0–34.0)
MCHC: 33.3 g/dL (ref 30.0–36.0)
MCV: 85.1 fL (ref 78.0–100.0)
MONO ABS: 0.3 10*3/uL (ref 0.1–1.0)
Monocytes Relative: 3 % (ref 3–12)
NEUTROS ABS: 11.1 10*3/uL — AB (ref 1.7–7.7)
Neutrophils Relative %: 87 % — ABNORMAL HIGH (ref 43–77)
Platelets: 275 10*3/uL (ref 150–400)
RBC: 5.09 MIL/uL (ref 4.22–5.81)
RDW: 14.6 % (ref 11.5–15.5)
WBC: 12.7 10*3/uL — ABNORMAL HIGH (ref 4.0–10.5)

## 2013-10-28 LAB — COMPREHENSIVE METABOLIC PANEL
ALBUMIN: 4.1 g/dL (ref 3.5–5.2)
ALT: 15 U/L (ref 0–53)
AST: 19 U/L (ref 0–37)
Alkaline Phosphatase: 157 U/L — ABNORMAL HIGH (ref 39–117)
Anion gap: 16 — ABNORMAL HIGH (ref 5–15)
BUN: 12 mg/dL (ref 6–23)
CALCIUM: 9.8 mg/dL (ref 8.4–10.5)
CO2: 29 mEq/L (ref 19–32)
CREATININE: 0.77 mg/dL (ref 0.50–1.35)
Chloride: 99 mEq/L (ref 96–112)
GFR calc Af Amer: >90 mL/min (ref >90)
GFR calc non Af Amer: >90 mL/min (ref >90)
Glucose, Bld: 446 mg/dL — ABNORMAL HIGH (ref 70–99)
Potassium: 5.2 mEq/L (ref 3.7–5.3)
Sodium: 144 mEq/L (ref 137–147)
TOTAL PROTEIN: 7.5 g/dL (ref 6.0–8.3)
Total Bilirubin: 0.4 mg/dL (ref 0.3–1.2)

## 2013-10-28 LAB — BASIC METABOLIC PANEL
ANION GAP: 18 — AB (ref 5–15)
BUN: 12 mg/dL (ref 6–23)
CHLORIDE: 103 meq/L (ref 96–112)
CO2: 24 mEq/L (ref 19–32)
Calcium: 9.2 mg/dL (ref 8.4–10.5)
Creatinine, Ser: 0.74 mg/dL (ref 0.50–1.35)
GFR calc Af Amer: >90 mL/min (ref >90)
GFR calc non Af Amer: >90 mL/min (ref >90)
Glucose, Bld: 330 mg/dL — ABNORMAL HIGH (ref 70–99)
Potassium: 4.2 mEq/L (ref 3.7–5.3)
Sodium: 145 mEq/L (ref 137–147)

## 2013-10-28 LAB — URINALYSIS, ROUTINE W REFLEX MICROSCOPIC
Bilirubin Urine: NEGATIVE
Glucose, UA: >1000 mg/dL — AB
Hgb urine dipstick: NEGATIVE
Ketones, ur: 15 mg/dL — AB
Leukocytes, UA: NEGATIVE
NITRITE: NEGATIVE
PH: 7 (ref 5.0–8.0)
Protein, ur: NEGATIVE mg/dL
Urobilinogen, UA: 0.2 mg/dL (ref 0.0–1.0)

## 2013-10-28 LAB — LIPASE, BLOOD: LIPASE: 61 U/L — AB (ref 11–59)

## 2013-10-28 LAB — URINE MICROSCOPIC-ADD ON

## 2013-10-28 LAB — GLUCOSE, CAPILLARY
GLUCOSE-CAPILLARY: 263 mg/dL — AB (ref 70–99)
Glucose-Capillary: 303 mg/dL — ABNORMAL HIGH (ref 70–99)

## 2013-10-28 LAB — MRSA PCR SCREENING: MRSA BY PCR: NEGATIVE

## 2013-10-28 MED ORDER — ONDANSETRON HCL 4 MG/2ML IJ SOLN
4.0000 mg | Freq: Once | INTRAMUSCULAR | Status: AC
  Administered 2013-10-28: 4 mg via INTRAVENOUS
  Filled 2013-10-28: qty 2

## 2013-10-28 MED ORDER — ONDANSETRON HCL 4 MG/2ML IJ SOLN
INTRAMUSCULAR | Status: AC
  Filled 2013-10-28: qty 2

## 2013-10-28 MED ORDER — SIMVASTATIN 10 MG PO TABS
10.0000 mg | ORAL_TABLET | Freq: Every day | ORAL | Status: DC
  Administered 2013-10-30 – 2013-10-31 (×2): 10 mg via ORAL
  Filled 2013-10-28 (×5): qty 1

## 2013-10-28 MED ORDER — FENTANYL CITRATE 0.05 MG/ML IJ SOLN
50.0000 ug | INTRAMUSCULAR | Status: DC | PRN
  Administered 2013-10-28: 50 ug via INTRAVENOUS
  Filled 2013-10-28: qty 2

## 2013-10-28 MED ORDER — INSULIN ASPART 100 UNIT/ML ~~LOC~~ SOLN: 0.0000 [IU] | SUBCUTANEOUS | Status: DC

## 2013-10-28 MED ORDER — DEXTROSE-NACL 5-0.45 % IV SOLN
INTRAVENOUS | Status: DC
  Administered 2013-10-29: 03:00:00 via INTRAVENOUS

## 2013-10-28 MED ORDER — ONDANSETRON HCL 4 MG/2ML IJ SOLN
4.0000 mg | Freq: Four times a day (QID) | INTRAMUSCULAR | Status: DC | PRN
  Administered 2013-10-28 – 2013-11-01 (×9): 4 mg via INTRAVENOUS
  Filled 2013-10-28 (×9): qty 2

## 2013-10-28 MED ORDER — SODIUM CHLORIDE 0.9 % IV SOLN
Freq: Once | INTRAVENOUS | Status: AC
  Administered 2013-10-28: 15:00:00 via INTRAVENOUS

## 2013-10-28 MED ORDER — ASPIRIN EC 81 MG PO TBEC
81.0000 mg | DELAYED_RELEASE_TABLET | ORAL | Status: DC
  Administered 2013-10-30 – 2013-11-01 (×3): 81 mg via ORAL
  Filled 2013-10-28 (×3): qty 1

## 2013-10-28 MED ORDER — DEXTROSE 50 % IV SOLN: 25.0000 mL | INTRAVENOUS | Status: DC | PRN

## 2013-10-28 MED ORDER — POTASSIUM CHLORIDE 10 MEQ/100ML IV SOLN
10.0000 meq | INTRAVENOUS | Status: AC
  Administered 2013-10-28 (×2): 10 meq via INTRAVENOUS
  Filled 2013-10-28 (×2): qty 100

## 2013-10-28 MED ORDER — ONDANSETRON HCL 4 MG/2ML IJ SOLN
4.0000 mg | INTRAMUSCULAR | Status: DC | PRN
  Administered 2013-10-28: 4 mg via INTRAVENOUS

## 2013-10-28 MED ORDER — LISINOPRIL 5 MG PO TABS
5.0000 mg | ORAL_TABLET | Freq: Every day | ORAL | Status: DC
  Administered 2013-10-29 – 2013-11-01 (×4): 5 mg via ORAL
  Filled 2013-10-28 (×4): qty 1

## 2013-10-28 MED ORDER — ONDANSETRON HCL 4 MG/2ML IJ SOLN
4.0000 mg | INTRAMUSCULAR | Status: DC | PRN
  Administered 2013-10-28: 4 mg via INTRAVENOUS
  Filled 2013-10-28 (×2): qty 2

## 2013-10-28 MED ORDER — FAMOTIDINE IN NACL 20-0.9 MG/50ML-% IV SOLN
20.0000 mg | Freq: Once | INTRAVENOUS | Status: AC
  Administered 2013-10-28: 20 mg via INTRAVENOUS
  Filled 2013-10-28: qty 50

## 2013-10-28 MED ORDER — SODIUM CHLORIDE 0.9 % IV SOLN: INTRAVENOUS | Status: DC

## 2013-10-28 MED ORDER — HEPARIN SODIUM (PORCINE) 5000 UNIT/ML IJ SOLN
5000.0000 [IU] | Freq: Three times a day (TID) | INTRAMUSCULAR | Status: DC
  Administered 2013-10-28 – 2013-11-01 (×8): 5000 [IU] via SUBCUTANEOUS
  Filled 2013-10-28 (×10): qty 1

## 2013-10-28 MED ORDER — SODIUM CHLORIDE 0.9 % IV BOLUS (SEPSIS)
1000.0000 mL | Freq: Once | INTRAVENOUS | Status: AC
  Administered 2013-10-28: 1000 mL via INTRAVENOUS

## 2013-10-28 MED ORDER — INSULIN ASPART 100 UNIT/ML ~~LOC~~ SOLN
2.0000 [IU] | Freq: Once | SUBCUTANEOUS | Status: AC
  Administered 2013-10-28: 2 [IU] via SUBCUTANEOUS
  Filled 2013-10-28: qty 1

## 2013-10-28 MED ORDER — PROMETHAZINE HCL 25 MG/ML IJ SOLN
12.5000 mg | Freq: Once | INTRAMUSCULAR | Status: AC
  Administered 2013-10-28: 12.5 mg via INTRAVENOUS
  Filled 2013-10-28: qty 1

## 2013-10-28 MED ORDER — SODIUM CHLORIDE 0.9 % IV SOLN
INTRAVENOUS | Status: DC
  Administered 2013-10-28: 2 [IU]/h via INTRAVENOUS
  Filled 2013-10-28: qty 1

## 2013-10-28 MED ORDER — SODIUM CHLORIDE 0.9 % IV SOLN
INTRAVENOUS | Status: DC
  Administered 2013-10-28: 23:00:00 via INTRAVENOUS

## 2013-10-28 NOTE — ED Notes (Signed)
Pt sleeping. 

## 2013-10-28 NOTE — ED Provider Notes (Signed)
CSN: [id number]     Arrival date & time 10/28/13  1343 History   First MD Initiated Contact with Patient 10/28/13 1440     Chief Complaint  Patient presents with  . Emesis    Blood  . Nausea  . Hyperglycemia      HPI Pt was seen at 1445. Per pt, c/o gradual onset and persistence of constant upper abd "pain" for the past several hours. Has been associated with several intermittent episodes of N/V. Pt states he "took off my insulin pump" this morning because his "sugar was low" last night. States he did not reconnect it this morning because of the N/V. Denies diarrhea, no back pain, no CP/SOB, no fevers, no rash, no black or blood in stools or emesis.    Past Medical History  Diagnosis Date  . GERD (gastroesophageal reflux disease)   . Diabetes mellitus    Past Surgical History  Procedure Laterality Date  . Knee surgery      History  Substance Use Topics  . Smoking status: Current Every Day Smoker  . Smokeless tobacco: Not on file  . Alcohol Use: No    Review of Systems ROS: Statement: All systems negative except as marked or noted in the HPI; Constitutional: Negative for fever and chills. ; ; Eyes: Negative for eye pain, redness and discharge. ; ; ENMT: Negative for ear pain, hoarseness, nasal congestion, sinus pressure and sore throat. ; ; Cardiovascular: Negative for chest pain, palpitations, diaphoresis, dyspnea and peripheral edema. ; ; Respiratory: Negative for cough, wheezing and stridor. ; ; Gastrointestinal: +N/V, abd pain. Negative for diarrhea, blood in stool, hematemesis, jaundice and rectal bleeding. . ; ; Genitourinary: Negative for dysuria, flank pain and hematuria. ; ; Musculoskeletal: Negative for back pain and neck pain. Negative for swelling and trauma.; ; Skin: Negative for pruritus, rash, abrasions, blisters, bruising and skin lesion.; ; Neuro: Negative for headache, lightheadedness and neck stiffness. Negative for weakness, altered level of consciousness , altered  mental status, extremity weakness, paresthesias, involuntary movement, seizure and syncope.      Allergies  Peanut-containing drug products  Home Medications   Prior to Admission medications   Medication Sig Start Date End Date Taking? Authorizing Provider  aspirin EC 81 MG tablet Take 81 mg by mouth every other day.   Yes Historical Provider, MD  insulin aspart (NOVOLOG) 100 UNIT/ML injection Inject into the skin 3 (three) times daily before meals. As directed per sliding scale instructions. Per patient: If levels are:  100-150= 2 units 151-200=3 units 201-250=4 units, and so forth as directed   Yes Historical Provider, MD  lovastatin (MEVACOR) 20 MG tablet Take 20 mg by mouth daily.   Yes Historical Provider, MD  lisinopril (PRINIVIL,ZESTRIL) 5 MG tablet Take 5 mg by mouth daily.    Historical Provider, MD   BP 157/89  Pulse 95  Temp(Src) 98.1 F (36.7 C) (Oral)  Resp 20  SpO2 98% Physical Exam 1450: Physical examination:  Nursing notes reviewed; Vital signs and O2 SAT reviewed;  Constitutional: Well developed, Well nourished, Uncomfortable appearing.; Head:  Normocephalic, atraumatic; Eyes: EOMI, PERRL, No scleral icterus; ENMT: Mouth and pharynx normal, Mucous membranes moist; Neck: Supple, Full range of motion, No lymphadenopathy; Cardiovascular: Regular rate and rhythm, No murmur, rub, or gallop; Respiratory: Breath sounds clear & equal bilaterally, No rales, rhonchi, wheezes.  Speaking full sentences with ease, Normal respiratory effort/excursion; Chest: Nontender, Movement normal; Abdomen: Soft, +mild mid-epigastric tenderness to palp. No rebound or guarding. +dry  heaving during exam. Nondistended, Normal bowel sounds; Genitourinary: No CVA tenderness; Extremities: Pulses normal, No tenderness, No edema, No calf edema or asymmetry.; Neuro: AA&Ox3, Major CN grossly intact.  Speech clear. No gross focal motor or sensory deficits in extremities. Climbs on and off stretcher easily by  himself. Gait steady.; Skin: Color normal, Warm, Dry.   ED Course  Procedures     MDM  MDM Reviewed: previous chart, nursing note and vitals Reviewed previous: labs Interpretation: labs and x-ray   Results for orders placed during the hospital encounter of 10/28/13  CBC WITH DIFFERENTIAL      Result Value Ref Range   WBC 12.7 (*) 4.0 - 10.5 K/uL   RBC 5.09  4.22 - 5.81 MIL/uL   Hemoglobin 14.4  13.0 - 17.0 g/dL   HCT 16.143.3  09.639.0 - 04.552.0 %   MCV 85.1  78.0 - 100.0 fL   MCH 28.3  26.0 - 34.0 pg   MCHC 33.3  30.0 - 36.0 g/dL   RDW 40.914.6  81.111.5 - 91.415.5 %   Platelets 275  150 - 400 K/uL   Neutrophils Relative % 87 (*) 43 - 77 %   Neutro Abs 11.1 (*) 1.7 - 7.7 K/uL   Lymphocytes Relative 10 (*) 12 - 46 %   Lymphs Abs 1.2  0.7 - 4.0 K/uL   Monocytes Relative 3  3 - 12 %   Monocytes Absolute 0.3  0.1 - 1.0 K/uL   Eosinophils Relative 0  0 - 5 %   Eosinophils Absolute 0.0  0.0 - 0.7 K/uL   Basophils Relative 0  0 - 1 %   Basophils Absolute 0.0  0.0 - 0.1 K/uL  COMPREHENSIVE METABOLIC PANEL      Result Value Ref Range   Sodium 144  137 - 147 mEq/L   Potassium 5.2  3.7 - 5.3 mEq/L   Chloride 99  96 - 112 mEq/L   CO2 29  19 - 32 mEq/L   Glucose, Bld 446 (*) 70 - 99 mg/dL   BUN 12  6 - 23 mg/dL   Creatinine, Ser 7.820.77  0.50 - 1.35 mg/dL   Calcium 9.8  8.4 - 95.610.5 mg/dL   Total Protein 7.5  6.0 - 8.3 g/dL   Albumin 4.1  3.5 - 5.2 g/dL   AST 19  0 - 37 U/L   ALT 15  0 - 53 U/L   Alkaline Phosphatase 157 (*) 39 - 117 U/L   Total Bilirubin 0.4  0.3 - 1.2 mg/dL   GFR calc non Af Amer >90  >90 mL/min   GFR calc Af Amer >90  >90 mL/min   Anion gap 16 (*) 5 - 15  LIPASE, BLOOD      Result Value Ref Range   Lipase 61 (*) 11 - 59 U/L  URINALYSIS, ROUTINE W REFLEX MICROSCOPIC      Result Value Ref Range   Color, Urine YELLOW  YELLOW   APPearance CLEAR  CLEAR   Specific Gravity, Urine <1.005 (*) 1.005 - 1.030   pH 7.0  5.0 - 8.0   Glucose, UA >1000 (*) NEGATIVE mg/dL   Hgb urine  dipstick NEGATIVE  NEGATIVE   Bilirubin Urine NEGATIVE  NEGATIVE   Ketones, ur 15 (*) NEGATIVE mg/dL   Protein, ur NEGATIVE  NEGATIVE mg/dL   Urobilinogen, UA 0.2  0.0 - 1.0 mg/dL   Nitrite NEGATIVE  NEGATIVE   Leukocytes, UA NEGATIVE  NEGATIVE  URINE MICROSCOPIC-ADD ON  Result Value Ref Range   Squamous Epithelial / LPF RARE  RARE   RBC / HPF 0-2  <3 RBC/hpf   Bacteria, UA RARE  RARE  CBG MONITORING, ED      Result Value Ref Range   Glucose-Capillary 358 (*) 70 - 99 mg/dL  CBG MONITORING, ED      Result Value Ref Range   Glucose-Capillary 311 (*) 70 - 99 mg/dL  CBG MONITORING, ED      Result Value Ref Range   Glucose-Capillary 340 (*) 70 - 99 mg/dL   Dg Abd Acute W/chest 10/28/2013   CLINICAL DATA:  Nausea, vomiting and weakness.  EXAM: ACUTE ABDOMEN SERIES (ABDOMEN 2 VIEW & CHEST 1 VIEW)  COMPARISON:  Two views of the abdomen 08/18/2012. Single view of the chest 07/23/2013.  FINDINGS: Single view of the chest demonstrates clear lungs and normal heart size. No pneumothorax or pleural effusion. No focal bony abnormality.  Two views of the abdomen show no free intraperitoneal air. The bowel gas pattern is unremarkable. No abnormal abdominal calcification or focal bony abnormality is identified.  IMPRESSION: Negative exam.   Electronically Signed   By: Drusilla Kannerhomas  Dalessio M.D.   On: 10/28/2013 16:45    1955:  Pt given IVF NS 2L on arrival to ED with CBG trending down to 311. AG 16. SQ insulin and additional IVF given with CBG now trending upward to 340. Pt given multiple doses of IV zofran with transient improvement, then starts to c/o nausea and dry heave again. Will dose IV phenergan, admit.  Dx and testing d/w pt and family.  Questions answered.  Verb understanding, agreeable to admit. T/C to [facility] Dr. Welton FlakesKhan, case discussed, including:  HPI, pertinent PM/SHx, VS/PE, dx testing, ED course and treatment:  Agreeable to admit, requests he will come to the ED for evaluation.      Laray AngerKathleen M  Quinnetta Roepke, DO 10/29/13 1645

## 2013-10-28 NOTE — H&P (Signed)
[facility] History and Physical  Lynne LeaderScottie O Civello ZOX:[id number] DOB: 1971/03/14 DOA: 10/28/2013  Referring physician: Samuel JesterKathleen McManus, DO PCP: No primary provider on file.   Chief Complaint: DKA  HPI: Danny Vargas is a 43 y.o. male diabetic who is on an insulin pump states that he took his insulin pump off yesterday as his sugar was running low. When he awoke this morning he was having nausea and vomiting and was basically unable to keep anything down. On initial evaluation in the ED he was noted to have a glucose of 446 and his anion gap was also elevated. Patient was treated with insulin and was hydrated with saline bolus. He inititially was going to be discharged but he has had increased vomiting and is not able to keep anything down at all. Patient has been having an elevation of his glucose again after an initial decline. Due to this he is being admitted. He has no fevers no diarrhea. He denies having any cough or congestion. No urinary symptoms. He states that he has had no rashes no other exposures.   Review of Systems:  Constitutional:  No weight loss, night sweats, Fevers, chills, fatigue.  HEENT:  No headaches, Difficulty swallowing,Tooth/dental problems,Sore throat,  Cardio-vascular:  No chest pain, Orthopnea, PND, swelling in lower extremities, anasarca, dizziness GI:  No heartburn, indigestion, abdominal pain, ++nausea, ++vomiting, no diarrhea, ++loss of appetite  Resp:  No shortness of breath with exertion or at rest. No excess mucus, no productive cough, No non-productive cough Skin:  no rash or lesions.  GU:  no dysuria, change in color of urine, no urgency or frequency. No flank pain.  Musculoskeletal:  No joint pain or swelling. No decreased range of motion. No back pain.  Psych:  No change in mood or affect. No depression or anxiety. No memory loss.   Past Medical History  Diagnosis Date  . GERD (gastroesophageal reflux disease)   . Diabetes mellitus     Past Surgical History  Procedure Laterality Date  . Knee surgery     Social History:  reports that he has been smoking.  He does not have any smokeless tobacco history on file. He reports that he does not drink alcohol or use illicit drugs.  Allergies  Allergen Reactions  . Peanut-Containing Drug Products Itching    Not all peanuts trigger the allergic reaction of itching in the throat and ears    History reviewed. No pertinent family history.   Prior to Admission medications   Medication Sig Start Date End Date Taking? Authorizing Provider  aspirin EC 81 MG tablet Take 81 mg by mouth every other day.   Yes Historical Provider, MD  insulin aspart (NOVOLOG) 100 UNIT/ML injection Inject into the skin 3 (three) times daily before meals. As directed per sliding scale instructions. Per patient: If levels are:  100-150= 2 units 151-200=3 units 201-250=4 units, and so forth as directed   Yes Historical Provider, MD  lovastatin (MEVACOR) 20 MG tablet Take 20 mg by mouth daily.   Yes Historical Provider, MD  lisinopril (PRINIVIL,ZESTRIL) 5 MG tablet Take 5 mg by mouth daily.    Historical Provider, MD   Physical Exam: Filed Vitals:   10/28/13 1801  BP: 157/89  Pulse: 95  Temp: 98.1 F (36.7 C)  Resp: 20    BP 157/89  Pulse 95  Temp(Src) 98.1 F (36.7 C) (Oral)  Resp 20  SpO2 98%  General:  Appears calm and comfortable Eyes: PERRL, normal lids, irises &  conjunctiva ENT: grossly normal hearing, lips & tongue Neck: no LAD, masses or thyromegaly Cardiovascular: RRR, no m/r/g. No LE edema. Respiratory: CTA bilaterally, no w/r/r. Normal respiratory effort. Abdomen: soft, ntnd Skin: no rash or induration seen on limited exam Musculoskeletal: grossly normal tone BUE/BLE Psychiatric: grossly normal mood and affect, speech fluent and appropriate Neurologic: grossly non-focal.          Labs on Admission:  Basic Metabolic Panel:  Recent Labs Lab 10/28/13 1501  NA 144  K  5.2  CL 99  CO2 29  GLUCOSE 446*  BUN 12  CREATININE 0.77  CALCIUM 9.8   Liver Function Tests:  Recent Labs Lab 10/28/13 1501  AST 19  ALT 15  ALKPHOS 157*  BILITOT 0.4  PROT 7.5  ALBUMIN 4.1    Recent Labs Lab 10/28/13 1501  LIPASE 61*   No results found for this basename: AMMONIA,  in the last 168 hours CBC:  Recent Labs Lab 10/28/13 1501  WBC 12.7*  NEUTROABS 11.1*  HGB 14.4  HCT 43.3  MCV 85.1  PLT 275   Cardiac Enzymes: No results found for this basename: CKTOTAL, CKMB, CKMBINDEX, TROPONINI,  in the last 168 hours  BNP (last 3 results) No results found for this basename: PROBNP,  in the last 8760 hours CBG:  Recent Labs Lab 10/28/13 1624 10/28/13 1756 10/28/13 1914  GLUCAP 358* 311* 340*    Radiological Exams on Admission: Dg Abd Acute W/chest  10/28/2013   CLINICAL DATA:  Nausea, vomiting and weakness.  EXAM: ACUTE ABDOMEN SERIES (ABDOMEN 2 VIEW & CHEST 1 VIEW)  COMPARISON:  Two views of the abdomen 08/18/2012. Single view of the chest 07/23/2013.  FINDINGS: Single view of the chest demonstrates clear lungs and normal heart size. No pneumothorax or pleural effusion. No focal bony abnormality.  Two views of the abdomen show no free intraperitoneal air. The bowel gas pattern is unremarkable. No abnormal abdominal calcification or focal bony abnormality is identified.  IMPRESSION: Negative exam.   Electronically Signed   By: Drusilla Kannerhomas  Dalessio M.D.   On: 10/28/2013 16:45     Assessment/Plan Principal Problem:   DKA (diabetic ketoacidoses) Active Problems:   Diabetes   GERD (gastroesophageal reflux disease)   Nausea & vomiting   1. DKA -he came in with a mild gap and elevated glucose. After hydration he has not responded and continues to have vomiting -will place on insulin drip and admit to step down until his gap is closed -continue with IV fluids -will hold his other insulin for now -check an A1C  2. Nausea and vomiting -unclear cause  could be gastroenteritis or related to his DM -will give antiemetics as needed -continue with IVF for now and keep NPO until he is able to eat  3. GERD -continue with home medications  4. Hyperlipidemia -will continue with home medications     Code Status: Full Code (must indicate code status--if unknown or must be presumed, indicate so) Family Communication: None (indicate person spoken with, if applicable, with phone number if by telephone) Disposition Plan: Home (indicate anticipated LOS)  Time spent: 70min  [facility] Round RockKHAN,Mikayla Chiusano A [facility] Pager [phone]  **Disclaimer: This note may have been dictated with voice recognition software. Similar sounding words can inadvertently be transcribed and this note may contain transcription errors which may not have been corrected upon publication of note.**

## 2013-10-28 NOTE — ED Notes (Signed)
Pt uses insulin pump. Did not put on this morning.

## 2013-10-28 NOTE — ED Notes (Signed)
Ems arrives with pt vomiting, complaining of nausea, onset this morning

## 2013-10-28 NOTE — ED Notes (Signed)
Repeat BS 480

## 2013-10-28 NOTE — ED Notes (Signed)
Pt has vomited blood tinged material

## 2013-10-29 DIAGNOSIS — E785 Hyperlipidemia, unspecified: Secondary | ICD-10-CM

## 2013-10-29 DIAGNOSIS — I10 Essential (primary) hypertension: Secondary | ICD-10-CM

## 2013-10-29 LAB — BASIC METABOLIC PANEL
ANION GAP: 11 (ref 5–15)
ANION GAP: 12 (ref 5–15)
ANION GAP: 19 — AB (ref 5–15)
BUN: 12 mg/dL (ref 6–23)
BUN: 13 mg/dL (ref 6–23)
BUN: 13 mg/dL (ref 6–23)
CALCIUM: 9.1 mg/dL (ref 8.4–10.5)
CALCIUM: 9.4 mg/dL (ref 8.4–10.5)
CHLORIDE: 103 meq/L (ref 96–112)
CHLORIDE: 107 meq/L (ref 96–112)
CO2: 23 mEq/L (ref 19–32)
CO2: 27 mEq/L (ref 19–32)
CO2: 27 meq/L (ref 19–32)
Calcium: 9.1 mg/dL (ref 8.4–10.5)
Chloride: 110 mEq/L (ref 96–112)
Creatinine, Ser: 0.77 mg/dL (ref 0.50–1.35)
Creatinine, Ser: 0.79 mg/dL (ref 0.50–1.35)
Creatinine, Ser: 0.79 mg/dL (ref 0.50–1.35)
GFR calc Af Amer: >90 mL/min (ref >90)
GFR calc Af Amer: >90 mL/min (ref >90)
GFR calc non Af Amer: >90 mL/min (ref >90)
GFR calc non Af Amer: >90 mL/min (ref >90)
GFR calc non Af Amer: >90 mL/min (ref >90)
Glucose, Bld: 112 mg/dL — ABNORMAL HIGH (ref 70–99)
Glucose, Bld: 222 mg/dL — ABNORMAL HIGH (ref 70–99)
Glucose, Bld: 371 mg/dL — ABNORMAL HIGH (ref 70–99)
Potassium: 3.7 mEq/L (ref 3.7–5.3)
Potassium: 4 mEq/L (ref 3.7–5.3)
Potassium: 4.2 mEq/L (ref 3.7–5.3)
SODIUM: 145 meq/L (ref 137–147)
SODIUM: 146 meq/L (ref 137–147)
SODIUM: 148 meq/L — AB (ref 137–147)

## 2013-10-29 LAB — RAPID URINE DRUG SCREEN, HOSP PERFORMED
Amphetamines: NOT DETECTED
BENZODIAZEPINES: NOT DETECTED
Barbiturates: NOT DETECTED
COCAINE: NOT DETECTED
Opiates: NOT DETECTED
Tetrahydrocannabinol: POSITIVE — AB

## 2013-10-29 LAB — GLUCOSE, CAPILLARY
GLUCOSE-CAPILLARY: 160 mg/dL — AB (ref 70–99)
GLUCOSE-CAPILLARY: 105 mg/dL — AB (ref 70–99)
GLUCOSE-CAPILLARY: 170 mg/dL — AB (ref 70–99)
GLUCOSE-CAPILLARY: 147 mg/dL — AB (ref 70–99)
GLUCOSE-CAPILLARY: 111 mg/dL — AB (ref 70–99)
GLUCOSE-CAPILLARY: 101 mg/dL — AB (ref 70–99)
Glucose-Capillary: 119 mg/dL — ABNORMAL HIGH (ref 70–99)
Glucose-Capillary: 165 mg/dL — ABNORMAL HIGH (ref 70–99)
Glucose-Capillary: 203 mg/dL — ABNORMAL HIGH (ref 70–99)
Glucose-Capillary: 260 mg/dL — ABNORMAL HIGH (ref 70–99)
Glucose-Capillary: 81 mg/dL (ref 70–99)
Glucose-Capillary: 95 mg/dL (ref 70–99)

## 2013-10-29 MED ORDER — METOCLOPRAMIDE HCL 5 MG/ML IJ SOLN
10.0000 mg | Freq: Once | INTRAMUSCULAR | Status: AC
  Administered 2013-10-29: 10 mg via INTRAVENOUS
  Filled 2013-10-29: qty 2

## 2013-10-29 MED ORDER — INSULIN GLARGINE 100 UNIT/ML ~~LOC~~ SOLN
10.0000 [IU] | Freq: Every day | SUBCUTANEOUS | Status: DC
  Administered 2013-10-29 – 2013-10-30 (×2): 10 [IU] via SUBCUTANEOUS
  Filled 2013-10-29 (×3): qty 0.1

## 2013-10-29 MED ORDER — INSULIN GLARGINE 100 UNIT/ML ~~LOC~~ SOLN: 10.0000 [IU] | Freq: Every day | SUBCUTANEOUS | Status: DC

## 2013-10-29 MED ORDER — SODIUM CHLORIDE 0.45 % IV SOLN
INTRAVENOUS | Status: DC
  Administered 2013-10-29 (×2): via INTRAVENOUS

## 2013-10-29 MED ORDER — PROMETHAZINE HCL 25 MG/ML IJ SOLN
12.5000 mg | Freq: Once | INTRAMUSCULAR | Status: AC
  Administered 2013-10-29: 12.5 mg via INTRAVENOUS
  Filled 2013-10-29: qty 1

## 2013-10-29 MED ORDER — PROMETHAZINE HCL 25 MG/ML IJ SOLN
12.5000 mg | Freq: Four times a day (QID) | INTRAMUSCULAR | Status: DC | PRN
  Administered 2013-10-29 (×2): 12.5 mg via INTRAVENOUS
  Administered 2013-10-30: 08:00:00 via INTRAVENOUS
  Administered 2013-10-30 – 2013-11-01 (×5): 12.5 mg via INTRAVENOUS
  Filled 2013-10-29 (×10): qty 1

## 2013-10-29 MED ORDER — INSULIN ASPART 100 UNIT/ML ~~LOC~~ SOLN
0.0000 [IU] | SUBCUTANEOUS | Status: DC
  Administered 2013-10-29: 1 [IU] via SUBCUTANEOUS
  Administered 2013-10-30: 2 [IU] via SUBCUTANEOUS
  Administered 2013-10-30: 3 [IU] via SUBCUTANEOUS
  Administered 2013-10-30: 2 [IU] via SUBCUTANEOUS
  Administered 2013-10-30: 3 [IU] via SUBCUTANEOUS
  Administered 2013-10-31: 2 [IU] via SUBCUTANEOUS
  Administered 2013-10-31: 5 [IU] via SUBCUTANEOUS
  Administered 2013-10-31: 3 [IU] via SUBCUTANEOUS
  Administered 2013-10-31: 2 [IU] via SUBCUTANEOUS
  Administered 2013-10-31: 3 [IU] via SUBCUTANEOUS
  Administered 2013-11-01: 1 [IU] via SUBCUTANEOUS
  Administered 2013-11-01 (×2): 2 [IU] via SUBCUTANEOUS
  Administered 2013-11-01: 1 [IU] via SUBCUTANEOUS

## 2013-10-29 NOTE — Progress Notes (Signed)
Inpatient Diabetes Program Recommendations  AACE/ADA: New Consensus Statement on Inpatient Glycemic Control (2013)  Target Ranges:  Prepandial:   less than 140 mg/dL      Peak postprandial:   less than 180 mg/dL (1-2 hours)      Critically ill patients:  140 - 180 mg/dL   Reason for Assessment:  Note patient admitted with DKA.  He wears an insulin pump.  According to history, his blood glucose was running low so he took off insulin pump and then woke up with high CBG's and Nausea/vomitting.  Spoke with patient on the phone.  He states that he does not know his insulin pump settings and that he is "tired".  He states that he has "Type 1" diabetes for 15 years and [place] [facility] [place].  The patient is now on Lantus/Novolog regimen.   He told the RN that he has not been eating right or covering his CHO.   Once patient is feeling better, he will need to call the "1-800" number on his insulin pump to make sure it is working properly.  Will attempt to call patient back later in day to assess knowledge and need for follow-up.   Please consider checking A1C to determine pre-hospitalization glycemic control. Based on patient's weight, consider increasing Lantus to 20 units daily.  Once patient is eating will also need Novolog meal coverage added.  Consider Novolog 5 units tid with meals.   Thanks, Beryl MeagerJenny Cambria Osten, RN, BC-ADM Inpatient Diabetes Coordinator Pager [phone]

## 2013-10-29 NOTE — Progress Notes (Signed)
INITIAL NUTRITION ASSESSMENT  DOCUMENTATION CODES Per approved criteria  -Underweight   INTERVENTION: -RD to follow for diet advancement -F/u for education once pt condition improves -Consider ONS once pt tolerates PO intake  NUTRITION DIAGNOSIS: Inadequate oral intake related to nausea and vomiting as evidenced by PO: 0%.   Goal: Pt will meet >90% of estimated nutritional needs  Monitor:  Diet advancement, PO intake, labs, weight changes, I/O's  Reason for Assessment: MST=3  43 y.o. male  Admitting Dx: DKA (diabetic ketoacidoses)  ASSESSMENT: Pt admitted with DKA. He took his insulin pump off yesterday d/t blood sugars running low. He has had n/v and intolerance to foods and liquids PTA. He is off insulin drip, but still unable to keep anything down. Plan is to transfer to floor once pt can tolerate liquids.  Chart reviewed. Pt diet upgraded to clear liquids for lunch, however, RN reports pt was reluctant to try. Lunch tray is untouched.  Pt somnolent at time of visit; unable to arouse enough for interview or exam- will defer until condition improves.  RN reports that pt admitted to noncompliance with diet at home. Diabetes coordinator called pt room earlier, but RN reports that pt hung up the phone after a brief conversation. RN confirms that pt is not appropriate for education at this time. Hgb A1c pending. RD will return for education at later date.  Noted Na: 148, glucose: 112, CBGs: 122. Noted improvement in glucose and CBGs since admission.   Height: Ht Readings from Last 1 Encounters:  10/28/13 6\' 3"  (1.905 m)    Weight: Wt Readings from Last 1 Encounters:  10/29/13 141 lb 5 oz (64.1 kg)    Ideal Body Weight: 196#  % Ideal Body Weight: 72%  Wt Readings from Last 10 Encounters:  10/29/13 141 lb 5 oz (64.1 kg)  01/01/12 145 lb (65.772 kg)    Usual Body Weight: 145#  % Usual Body Weight: 92%  BMI:  Body mass index is 17.66 kg/(m^2).  Underweight  Estimated Nutritional Needs: Kcal: [id number] Protein: 71-83 grams Fluid: 1.6-1.9 L  Skin: WDL  Diet Order: NPO  EDUCATION NEEDS: -Education not appropriate at this time   Intake/Output Summary (Last 24 hours) at 10/29/13 0858 Last data filed at 10/29/13 0835  Gross per 24 hour  Intake 987.06 ml  Output   1075 ml  Net -87.94 ml    Last BM: 10/27/13  Labs:   Recent Labs Lab 10/28/13 2332 10/29/13 0157 10/29/13 0345  NA 145 146 148*  K 4.2 4.0 3.7  CL 103 107 110  CO2 23 27 27   BUN 13 13 12   CREATININE 0.77 0.79 0.79  CALCIUM 9.1 9.1 9.4  GLUCOSE 371* 222* 112*    CBG (last 3)   Recent Labs  10/29/13 0532 10/29/13 0635 10/29/13 0739  GLUCAP 119* 170* 165*   Scheduled Meds: . [START ON 10/30/2013] aspirin EC  81 mg Oral QODAY  . heparin  5,000 Units Subcutaneous 3 times per day  . insulin aspart  0-9 Units Subcutaneous 6 times per day  . insulin glargine  10 Units Subcutaneous QHS  . lisinopril  5 mg Oral Daily  . simvastatin  10 mg Oral q1800    Continuous Infusions: . dextrose 5 % and 0.45% NaCl 75 mL/hr at 10/29/13 0700    Past Medical History  Diagnosis Date  . GERD (gastroesophageal reflux disease)   . Diabetes mellitus     Past Surgical History  Procedure Laterality Date  .  Knee surgery      Keyonni Percival A. Mayford KnifeWilliams, RD, LDN Pager: [phone]

## 2013-10-29 NOTE — Progress Notes (Signed)
Called report to Charna Busmanindy Morris, RN on dept 300.  Verbalized understanding.  Pt transferred to floor in safe and stable condition. Schonewitz, Candelaria StagersLeigh Anne 10/29/2013

## 2013-10-29 NOTE — Progress Notes (Signed)
UR chart review completed.  

## 2013-10-29 NOTE — Progress Notes (Signed)
[facility]                                                                              Patient Demographics  Danny Vargas, is a 43 y.o. male, DOB - 07-21-1970, BJY:[id number]  Admit date - 10/28/2013   Admitting Physician Yevonne PaxSaadat A Khan, MD  Outpatient Primary MD for the patient is No primary provider on file.  LOS - 1   Chief Complaint  Patient presents with  . Emesis    Blood  . Nausea  . Hyperglycemia      HPI: Danny Vargas is a 43 y.o. male diabetic who is on an insulin pump states that he took his insulin pump off yesterday as his sugar was running low. When he awoke, he was having nausea and vomiting and was basically unable to keep anything down. On initial evaluation in the ED he was noted to have a glucose of 446 and his anion gap was also elevated. Patient was treated with insulin and was hydrated with saline bolus. He inititially was going to be discharged but he has had increased vomiting and is not able to keep anything down at all. Patient has been having an elevation of his glucose again after an initial decline. Due to this, patient was admitted. He denied fevers and diarrhea. He denied having any cough or congestion. No urinary symptoms. He stated that he has had no rashes no other exposures.   Assessment & Plan   Diabetic ketoacidosis -Patient has an insulin pump and admits to not entering his carbs correctly or eating a proper carb modified diet -Anion Gap has closed, however patient continues to have nausea -Will switch to 1/2NS -No longer on insulin drip, and patient currently on Lantus and novolog -HbA1c pending  Nausea and vomiting -Secondary to DKA vs ?gastroenteritis -Continue supportive care with antiemetics -Continue NPO until patient is able to tolerate clears  Hyperlipidemia -Continue statin  Hypertension  -Continue lisinopril  Code Status: Full  Family Communication: None at bedside  Disposition Plan: Admitted to Stepdown/ICU. Once  patient is able to tolerate clears will transfer to the floor.  Time Spent in minutes   30 minutes  Procedures  None  Consults   None  DVT Prophylaxis  Heparin  Lab Results  Component Value Date   PLT 275 10/28/2013    Medications  Scheduled Meds: . [START ON 10/30/2013] aspirin EC  81 mg Oral QODAY  . heparin  5,000 Units Subcutaneous 3 times per day  . insulin aspart  0-9 Units Subcutaneous 6 times per day  . insulin glargine  10 Units Subcutaneous QHS  . lisinopril  5 mg Oral Daily  . simvastatin  10 mg Oral q1800   Continuous Infusions: . dextrose 5 % and 0.45% NaCl 75 mL/hr at 10/29/13 0700   PRN Meds:.dextrose, ondansetron (ZOFRAN) IV, promethazine  Antibiotics    Anti-infectives   None      Subjective:   Maston Linhares seen and examined today.  Patient continues tocomplain of nausea and vomiting. He is unable to keep water down at this time. Denies dizziness, chest pain, shortness of breath, abdominal pain.  Objective:   Filed Vitals:  10/29/13 0500 10/29/13 0600 10/29/13 0700 10/29/13 0800  BP: 121/62 125/59 128/69 131/82  Pulse:      Temp:    98.7 F (37.1 C)  TempSrc:    Oral  Resp: 17   15  Height:      Weight: 64.1 kg (141 lb 5 oz)     SpO2:        Wt Readings from Last 3 Encounters:  10/29/13 64.1 kg (141 lb 5 oz)  01/01/12 65.772 kg (145 lb)     Intake/Output Summary (Last 24 hours) at 10/29/13 0838 Last data filed at 10/29/13 0835  Gross per 24 hour  Intake 987.06 ml  Output   1075 ml  Net -87.94 ml    Exam  General: Well developed, well nourished, NAD, appears stated age  HEENT: NCAT, PERRLA, EOMI, Anicteic Sclera, mucous membranes moist.   Neck: Supple, no JVD, no masses  Cardiovascular: S1 S2 auscultated, no rubs, murmurs or gallops. Regular rate and rhythm.  Respiratory: Clear to auscultation bilaterally with equal chest rise  Abdomen: Soft, nontender, nondistended, + bowel sounds  Extremities: warm dry without  cyanosis clubbing or edema  Neuro: AAOx3, cranial nerves grossly intact. Strength 5/5 in patient's upper and lower extremities bilaterally  Skin: Without rashes exudates or nodules  Psych: Normal affect and demeanor with intact judgement and insight  Data Review   Micro Results Recent Results (from the past 240 hour(s))  MRSA PCR SCREENING     Status: None   Collection Time    10/28/13  9:45 PM      Result Value Ref Range Status   MRSA by PCR NEGATIVE  NEGATIVE Final   Comment:            The GeneXpert MRSA Assay (FDA     approved for NASAL specimens     only), is one component of a     comprehensive MRSA colonization     surveillance program. It is not     intended to diagnose MRSA     infection nor to guide or     monitor treatment for     MRSA infections.    Radiology Reports Dg Abd Acute W/chest  10/28/2013   CLINICAL DATA:  Nausea, vomiting and weakness.  EXAM: ACUTE ABDOMEN SERIES (ABDOMEN 2 VIEW & CHEST 1 VIEW)  COMPARISON:  Two views of the abdomen 08/18/2012. Single view of the chest 07/23/2013.  FINDINGS: Single view of the chest demonstrates clear lungs and normal heart size. No pneumothorax or pleural effusion. No focal bony abnormality.  Two views of the abdomen show no free intraperitoneal air. The bowel gas pattern is unremarkable. No abnormal abdominal calcification or focal bony abnormality is identified.  IMPRESSION: Negative exam.   Electronically Signed   By: Drusilla Kanner M.D.   On: 10/28/2013 16:45    CBC  Recent Labs Lab 10/28/13 1501  WBC 12.7*  HGB 14.4  HCT 43.3  PLT 275  MCV 85.1  MCH 28.3  MCHC 33.3  RDW 14.6  LYMPHSABS 1.2  MONOABS 0.3  EOSABS 0.0  BASOSABS 0.0    Chemistries   Recent Labs Lab 10/28/13 1501 10/28/13 2154 10/28/13 2332 10/29/13 0157 10/29/13 0345  NA 144 145 145 146 148*  K 5.2 4.2 4.2 4.0 3.7  CL 99 103 103 107 110  CO2 29 24 23 27 27   GLUCOSE 446* 330* 371* 222* 112*  BUN 12 12 13 13 12   CREATININE  0.77 0.74 0.77 0.79  0.79  CALCIUM 9.8 9.2 9.1 9.1 9.4  AST 19  --   --   --   --   ALT 15  --   --   --   --   ALKPHOS 157*  --   --   --   --   BILITOT 0.4  --   --   --   --    ------------------------------------------------------------------------------------------------------------------ estimated creatinine clearance is 107.9 ml/min (by C-G formula based on Cr of 0.79). ------------------------------------------------------------------------------------------------------------------ No results found for this basename: HGBA1C,  in the last 72 hours ------------------------------------------------------------------------------------------------------------------ No results found for this basename: CHOL, HDL, LDLCALC, TRIG, CHOLHDL, LDLDIRECT,  in the last 72 hours ------------------------------------------------------------------------------------------------------------------ No results found for this basename: TSH, T4TOTAL, FREET3, T3FREE, THYROIDAB,  in the last 72 hours ------------------------------------------------------------------------------------------------------------------ No results found for this basename: VITAMINB12, FOLATE, FERRITIN, TIBC, IRON, RETICCTPCT,  in the last 72 hours  Coagulation profile No results found for this basename: INR, PROTIME,  in the last 168 hours  No results found for this basename: DDIMER,  in the last 72 hours  Cardiac Enzymes No results found for this basename: CK, CKMB, TROPONINI, MYOGLOBIN,  in the last 168 hours ------------------------------------------------------------------------------------------------------------------ No components found with this basename: POCBNP,     Braylei Totino D.O. on 10/29/2013 at 8:38 AM  Between 7am to 7pm - Pager - [phone]  After 7pm go to [email or web address] - password [email or web address]  And look for the night coverage person covering for me after hours  [facility] Office  [phone]

## 2013-10-29 NOTE — Progress Notes (Signed)
1440-  CBG looks great at 12 pm.  Therefore, may not need increased Lantus at this time.  Agree with current dose of Lantus and q 4 hour correction.  Once eating, he may also need Novolog meal coverage.  Will follow.  Beryl MeagerJenny Makalah Asberry, RN, BC-ADM Inpatient Diabetes Coordinator Pager [phone]

## 2013-10-30 LAB — BASIC METABOLIC PANEL
Anion gap: 12 (ref 5–15)
BUN: 9 mg/dL (ref 6–23)
CO2: 25 mEq/L (ref 19–32)
Calcium: 9 mg/dL (ref 8.4–10.5)
Chloride: 101 mEq/L (ref 96–112)
Creatinine, Ser: 0.69 mg/dL (ref 0.50–1.35)
GFR calc Af Amer: >90 mL/min (ref >90)
GLUCOSE: 175 mg/dL — AB (ref 70–99)
Potassium: 3.5 mEq/L — ABNORMAL LOW (ref 3.7–5.3)
Sodium: 138 mEq/L (ref 137–147)

## 2013-10-30 LAB — GLUCOSE, CAPILLARY
GLUCOSE-CAPILLARY: 78 mg/dL (ref 70–99)
GLUCOSE-CAPILLARY: 161 mg/dL — AB (ref 70–99)
GLUCOSE-CAPILLARY: 164 mg/dL — AB (ref 70–99)
Glucose-Capillary: 208 mg/dL — ABNORMAL HIGH (ref 70–99)
Glucose-Capillary: 171 mg/dL — ABNORMAL HIGH (ref 70–99)

## 2013-10-30 LAB — CBC
HEMATOCRIT: 38.5 % — AB (ref 39.0–52.0)
HEMOGLOBIN: 13.1 g/dL (ref 13.0–17.0)
MCH: 28.2 pg (ref 26.0–34.0)
MCHC: 34 g/dL (ref 30.0–36.0)
MCV: 83 fL (ref 78.0–100.0)
Platelets: 227 10*3/uL (ref 150–400)
RBC: 4.64 MIL/uL (ref 4.22–5.81)
RDW: 14.6 % (ref 11.5–15.5)
WBC: 17.6 10*3/uL — ABNORMAL HIGH (ref 4.0–10.5)

## 2013-10-30 MED ORDER — METOCLOPRAMIDE HCL 5 MG/ML IJ SOLN
10.0000 mg | Freq: Four times a day (QID) | INTRAMUSCULAR | Status: DC
  Administered 2013-10-30 – 2013-11-01 (×10): 10 mg via INTRAVENOUS
  Filled 2013-10-30 (×10): qty 2

## 2013-10-30 NOTE — Progress Notes (Signed)
Patient had a very restless night.  Patient felt nauseated all night. Patient had episodes of vomiting during night.  Patient not able to tolerate any liquids offered to him at all.

## 2013-10-30 NOTE — Progress Notes (Signed)
Inpatient Diabetes Program Recommendations  AACE/ADA: New Consensus Statement on Inpatient Glycemic Control (2013)  Target Ranges:  Prepandial:   less than 140 mg/dL      Peak postprandial:   less than 180 mg/dL (1-2 hours)      Critically ill patients:  140 - 180 mg/dL   Reason for Assessment: Hx; pump user, recent DKA admission  Diabetes history: Type 1 Outpatient Diabetes medications: Insulin pump- patient unable to articulate pump settings Current orders for Inpatient glycemic control: Lantus 10 units QHS, Novolog 0-9units 6times per day  No correction Novolog given through the night- RN note states patient refused.   CBG this a.m. 208mg /dl.  Spoke with RN this am who states patient has been nauseated and vomiting through the night but did not question use of Novolog this a.m.- 3 units given based on correction order.  Asked RN to reinforce the need for regular use of correction Novolog throughout the day and night regardless of nausea and vomiting.   Danny RacerJulie Lasandra Batley, RN, BA, MHA, CDE Diabetes Coordinator Inpatient Diabetes Program  [phone] (Team Pager) [phone] Danny Vargas([place] [facility]) 10/30/2013 9:22 AM

## 2013-10-30 NOTE — Progress Notes (Signed)
[facility]                                                                              Patient Demographics  Danny Vargas, is a 43 y.o. male, DOB - Mar 27, 1971, ZOX:[id number]  Admit date - 10/28/2013   Admitting Physician Yevonne PaxSaadat A Khan, MD  Outpatient Primary MD for the patient is No primary provider on file.  LOS - 2   Chief Complaint  Patient presents with  . Emesis    Blood  . Nausea  . Hyperglycemia      HPI: Danny Vargas is a 43 y.o. male diabetic who is on an insulin pump states that he took his insulin pump off yesterday as his sugar was running low. When he awoke, he was having nausea and vomiting and was basically unable to keep anything down. On initial evaluation in the ED he was noted to have a glucose of 446 and his anion gap was also elevated. Patient was treated with insulin and was hydrated with saline bolus. He inititially was going to be discharged but he has had increased vomiting and is not able to keep anything down at all. Patient has been having an elevation of his glucose again after an initial decline. Due to this, patient was admitted. He denied fevers and diarrhea. He denied having any cough or congestion. No urinary symptoms. He stated that he has had no rashes no other exposures.   Assessment & Plan   Diabetic ketoacidosis -Patient has an insulin pump and admits to not entering his carbs correctly or eating a proper carb modified diet -Anion Gap has closed, however patient continues to have nausea -No longer on insulin drip, and patient currently on Lantus and novolog -HbA1c pending -Patient has refused insulin overnight.  Nausea and vomiting -Secondary to DKA vs ?gastroenteritis -Continue supportive care with antiemetics -Continue clear liquid diet -Will order GI pathogen panel  Leukocytosis -likely reactive vs infection -etiology unknown -Abd series with chest negative for infection/infiltrate -UA negative for infection -Will  continue to monitor to CBC  Hyperlipidemia -Continue statin  Hypertension  -Continue lisinopril  Drug abuse -toxicology screen positive for [facility] -Patient counseled  Code Status: Full  Family Communication: None at bedside  Disposition Plan: Admitted  Time Spent in minutes   30 minutes  Procedures  None  Consults   None  DVT Prophylaxis  Heparin  Lab Results  Component Value Date   PLT 227 10/30/2013    Medications  Scheduled Meds: . aspirin EC  81 mg Oral QODAY  . heparin  5,000 Units Subcutaneous 3 times per day  . insulin aspart  0-9 Units Subcutaneous 6 times per day  . insulin glargine  10 Units Subcutaneous QHS  . lisinopril  5 mg Oral Daily  . metoCLOPramide (REGLAN) injection  10 mg Intravenous 4 times per day  . simvastatin  10 mg Oral q1800   Continuous Infusions:   PRN Meds:.dextrose, ondansetron (ZOFRAN) IV, promethazine  Antibiotics    Anti-infectives   None      Subjective:   Danny Vargas seen and examined today.  Patient continues tocomplain of nausea and vomiting. He is unable to provide much  information. Denies dizziness, chest pain, shortness of breath, abdominal pain.  Objective:   Filed Vitals:   10/29/13 1400 10/29/13 2119 10/30/13 0100 10/30/13 0425  BP:  138/88 121/83 120/69  Pulse:    83  Temp:  98.5 F (36.9 C) 98.4 F (36.9 C) 98.6 F (37 C)  TempSrc:  Oral  Oral  Resp: 16 16 16 16   Height:      Weight:      SpO2:  95% 97% 98%    Wt Readings from Last 3 Encounters:  10/29/13 64.1 kg (141 lb 5 oz)  01/01/12 65.772 kg (145 lb)     Intake/Output Summary (Last 24 hours) at 10/30/13 1328 Last data filed at 10/29/13 1911  Gross per 24 hour  Intake    270 ml  Output    200 ml  Net     70 ml    Exam  General: Well developed, well nourished, NAD, appears stated age  HEENT: NCAT,mucous membranes moist.   Neck: Supple, no JVD, no masses  Cardiovascular: S1 S2 auscultated, no rubs, murmurs or gallops. Regular  rate and rhythm.  Respiratory: Clear to auscultation bilaterally with equal chest rise  Abdomen: Soft, nontender, nondistended, + bowel sounds  Extremities: warm dry without cyanosis clubbing or edema  Neuro: AAOx3, no focal deficits  Skin: Without rashes exudates or nodules, tattoos  Psych: Normal affect and demeanor with intact judgement and insight  Data Review   Micro Results Recent Results (from the past 240 hour(s))  MRSA PCR SCREENING     Status: None   Collection Time    10/28/13  9:45 PM      Result Value Ref Range Status   MRSA by PCR NEGATIVE  NEGATIVE Final   Comment:            The GeneXpert MRSA Assay (FDA     approved for NASAL specimens     only), is one component of a     comprehensive MRSA colonization     surveillance program. It is not     intended to diagnose MRSA     infection nor to guide or     monitor treatment for     MRSA infections.    Radiology Reports Dg Abd Acute W/chest  10/28/2013   CLINICAL DATA:  Nausea, vomiting and weakness.  EXAM: ACUTE ABDOMEN SERIES (ABDOMEN 2 VIEW & CHEST 1 VIEW)  COMPARISON:  Two views of the abdomen 08/18/2012. Single view of the chest 07/23/2013.  FINDINGS: Single view of the chest demonstrates clear lungs and normal heart size. No pneumothorax or pleural effusion. No focal bony abnormality.  Two views of the abdomen show no free intraperitoneal air. The bowel gas pattern is unremarkable. No abnormal abdominal calcification or focal bony abnormality is identified.  IMPRESSION: Negative exam.   Electronically Signed   By: Drusilla Kannerhomas  Dalessio M.D.   On: 10/28/2013 16:45    CBC  Recent Labs Lab 10/28/13 1501 10/30/13 0415  WBC 12.7* 17.6*  HGB 14.4 13.1  HCT 43.3 38.5*  PLT 275 227  MCV 85.1 83.0  MCH 28.3 28.2  MCHC 33.3 34.0  RDW 14.6 14.6  LYMPHSABS 1.2  --   MONOABS 0.3  --   EOSABS 0.0  --   BASOSABS 0.0  --     Chemistries   Recent Labs Lab 10/28/13 1501 10/28/13 2154 10/28/13 2332  10/29/13 0157 10/29/13 0345 10/30/13 0415  NA 144 145 145 146 148* 138  K 5.2  4.2 4.2 4.0 3.7 3.5*  CL 99 103 103 107 110 101  CO2 29 24 23 27 27 25   GLUCOSE 446* 330* 371* 222* 112* 175*  BUN 12 12 13 13 12 9   CREATININE 0.77 0.74 0.77 0.79 0.79 0.69  CALCIUM 9.8 9.2 9.1 9.1 9.4 9.0  AST 19  --   --   --   --   --   ALT 15  --   --   --   --   --   ALKPHOS 157*  --   --   --   --   --   BILITOT 0.4  --   --   --   --   --    ------------------------------------------------------------------------------------------------------------------ estimated creatinine clearance is 107.9 ml/min (by C-G formula based on Cr of 0.69). ------------------------------------------------------------------------------------------------------------------ No results found for this basename: HGBA1C,  in the last 72 hours ------------------------------------------------------------------------------------------------------------------ No results found for this basename: CHOL, HDL, LDLCALC, TRIG, CHOLHDL, LDLDIRECT,  in the last 72 hours ------------------------------------------------------------------------------------------------------------------ No results found for this basename: TSH, T4TOTAL, FREET3, T3FREE, THYROIDAB,  in the last 72 hours ------------------------------------------------------------------------------------------------------------------ No results found for this basename: VITAMINB12, FOLATE, FERRITIN, TIBC, IRON, RETICCTPCT,  in the last 72 hours  Coagulation profile No results found for this basename: INR, PROTIME,  in the last 168 hours  No results found for this basename: DDIMER,  in the last 72 hours  Cardiac Enzymes No results found for this basename: CK, CKMB, TROPONINI, MYOGLOBIN,  in the last 168 hours ------------------------------------------------------------------------------------------------------------------ No components found with this basename: POCBNP,      Aviel Davalos D.O. on 10/30/2013 at 1:28 PM  Between 7am to 7pm - Pager - [phone]  After 7pm go to [email or web address] - password [email or web address]  And look for the night coverage person covering for me after hours  [facility] Office  [phone]

## 2013-10-31 DIAGNOSIS — K3184 Gastroparesis: Secondary | ICD-10-CM

## 2013-10-31 DIAGNOSIS — E876 Hypokalemia: Secondary | ICD-10-CM

## 2013-10-31 LAB — CBC
HCT: 41.8 % (ref 39.0–52.0)
Hemoglobin: 14.3 g/dL (ref 13.0–17.0)
MCH: 28.1 pg (ref 26.0–34.0)
MCHC: 34.2 g/dL (ref 30.0–36.0)
MCV: 82.1 fL (ref 78.0–100.0)
PLATELETS: 252 10*3/uL (ref 150–400)
RBC: 5.09 MIL/uL (ref 4.22–5.81)
RDW: 14.3 % (ref 11.5–15.5)
WBC: 14.7 10*3/uL — ABNORMAL HIGH (ref 4.0–10.5)

## 2013-10-31 LAB — GLUCOSE, CAPILLARY
GLUCOSE-CAPILLARY: 111 mg/dL — AB (ref 70–99)
GLUCOSE-CAPILLARY: 215 mg/dL — AB (ref 70–99)
Glucose-Capillary: 155 mg/dL — ABNORMAL HIGH (ref 70–99)
Glucose-Capillary: 163 mg/dL — ABNORMAL HIGH (ref 70–99)
Glucose-Capillary: 217 mg/dL — ABNORMAL HIGH (ref 70–99)
Glucose-Capillary: 257 mg/dL — ABNORMAL HIGH (ref 70–99)

## 2013-10-31 LAB — BASIC METABOLIC PANEL
ANION GAP: 12 (ref 5–15)
BUN: 13 mg/dL (ref 6–23)
CO2: 26 mEq/L (ref 19–32)
CREATININE: 0.86 mg/dL (ref 0.50–1.35)
Calcium: 9.1 mg/dL (ref 8.4–10.5)
Chloride: 102 mEq/L (ref 96–112)
GFR calc non Af Amer: >90 mL/min (ref >90)
Glucose, Bld: 135 mg/dL — ABNORMAL HIGH (ref 70–99)
Potassium: 3.6 mEq/L — ABNORMAL LOW (ref 3.7–5.3)
Sodium: 140 mEq/L (ref 137–147)

## 2013-10-31 LAB — HEMOGLOBIN A1C
Hgb A1c MFr Bld: 10.5 % — ABNORMAL HIGH (ref <5.7)
Mean Plasma Glucose: 255 mg/dL — ABNORMAL HIGH (ref <117)

## 2013-10-31 MED ORDER — POTASSIUM CHLORIDE CRYS ER 20 MEQ PO TBCR
40.0000 meq | EXTENDED_RELEASE_TABLET | Freq: Once | ORAL | Status: AC
  Administered 2013-10-31: 40 meq via ORAL
  Filled 2013-10-31: qty 2

## 2013-10-31 NOTE — Progress Notes (Addendum)
[facility]                                                                              Patient Demographics  Danny Vargas, is a 43 y.o. male, DOB - [birth year], ZOX:[id number]  Admit date - 10/28/2013   Admitting Physician Yevonne Pax, MD  Outpatient Primary MD for the patient is No primary provider on file.  LOS - 3   Chief Complaint  Patient presents with  . Emesis    Blood  . Nausea  . Hyperglycemia      HPI: Danny Vargas is a 44 y.o. male diabetic who is on an insulin pump states that he took his insulin pump off yesterday as his sugar was running low. When he awoke, he was having nausea and vomiting and was basically unable to keep anything down. On initial evaluation in the ED he was noted to have a glucose of 446 and his anion gap was also elevated. Patient was treated with insulin and was hydrated with saline bolus. He inititially was going to be discharged but he has had increased vomiting and is not able to keep anything down at all. Patient has been having an elevation of his glucose again after an initial decline. Due to this, patient was admitted. He denied fevers and diarrhea. He denied having any cough or congestion. No urinary symptoms. He stated that he has had no rashes no other exposures.   Assessment & Plan   Diabetic ketoacidosis -Patient has an insulin pump and admits to not entering his carbs correctly or eating a proper carb modified diet -Anion Gap has closed, however patient continues to have nausea -No longer on insulin drip, and patient currently on Lantus and novolog -HbA1c 10.5  Nausea and vomiting -Secondary to DKA/ gastroparesis vs ?gastroenteritis -Continue supportive care with antiemetics -Continue clear liquid diet -GI pathogen panel pending  Leukocytosis -Improving,likely reactive vs infection -etiology unknown -Abd series with chest negative for infection/infiltrate -UA negative for infection -Will continue to monitor to  CBC  Hyperlipidemia -Continue statin  Hypertension  -Continue lisinopril  Drug abuse -toxicology screen positive for [facility] -Patient counseled  Hypokalemia -Will replace and continue to monitor.  Code Status: Full  Family Communication: None at bedside  Disposition Plan: Admitted  Time Spent in minutes   25 minutes  Procedures  None  Consults   None  DVT Prophylaxis  Heparin  Lab Results  Component Value Date   PLT 252 10/31/2013    Medications  Scheduled Meds: . aspirin EC  81 mg Oral QODAY  . heparin  5,000 Units Subcutaneous 3 times per day  . insulin aspart  0-9 Units Subcutaneous 6 times per day  . insulin glargine  10 Units Subcutaneous QHS  . lisinopril  5 mg Oral Daily  . metoCLOPramide (REGLAN) injection  10 mg Intravenous 4 times per day  . simvastatin  10 mg Oral q1800   Continuous Infusions:   PRN Meds:.dextrose, ondansetron (ZOFRAN) IV, promethazine  Antibiotics    Anti-infectives   None      Subjective:   Danny Vargas seen and examined today.  Patient continues to complain of nausea and vomiting and achy  abdominal pain. Denies dizziness, chest pain, shortness of breath, diarrhea or constipation.  Objective:   Filed Vitals:   10/30/13 0425 10/30/13 1640 10/30/13 2231 10/31/13 0632  BP: 120/69 144/84 146/85 132/86  Pulse: 83 89 90 99  Temp: 98.6 F (37 C) 98.6 F (37 C) 99.1 F (37.3 C) 99 F (37.2 C)  TempSrc: Oral Oral Oral Oral  Resp: 16 16 16 16   Height:      Weight:      SpO2: 98% 99% 99% 100%    Wt Readings from Last 3 Encounters:  10/29/13 64.1 kg (141 lb 5 oz)  01/01/12 65.772 kg (145 lb)    No intake or output data in the 24 hours ending 10/31/13 1251  Exam  General: Well developed, well nourished, NAD, appears stated age  HEENT: NCAT,mucous membranes moist.   Neck: Supple, no JVD, no masses  Cardiovascular: S1 S2 auscultated, no rubs, murmurs or gallops. Regular rate and rhythm.  Respiratory: Clear to  auscultation bilaterally with equal chest rise  Abdomen: Soft, nontender, nondistended, + bowel sounds  Extremities: warm dry without cyanosis clubbing or edema  Neuro: AAOx3, no focal deficits  Skin: Without rashes exudates or nodules, tattoos  Psych: Normal affect and demeanor with intact judgement and insight  Data Review   Micro Results Recent Results (from the past 240 hour(s))  MRSA PCR SCREENING     Status: None   Collection Time    10/28/13  9:45 PM      Result Value Ref Range Status   MRSA by PCR NEGATIVE  NEGATIVE Final   Comment:            The GeneXpert MRSA Assay (FDA     approved for NASAL specimens     only), is one component of a     comprehensive MRSA colonization     surveillance program. It is not     intended to diagnose MRSA     infection nor to guide or     monitor treatment for     MRSA infections.    Radiology Reports Dg Abd Acute W/chest  10/28/2013   CLINICAL DATA:  Nausea, vomiting and weakness.  EXAM: ACUTE ABDOMEN SERIES (ABDOMEN 2 VIEW & CHEST 1 VIEW)  COMPARISON:  Two views of the abdomen 08/18/2012. Single view of the chest 07/23/2013.  FINDINGS: Single view of the chest demonstrates clear lungs and normal heart size. No pneumothorax or pleural effusion. No focal bony abnormality.  Two views of the abdomen show no free intraperitoneal air. The bowel gas pattern is unremarkable. No abnormal abdominal calcification or focal bony abnormality is identified.  IMPRESSION: Negative exam.   Electronically Signed   By: Drusilla Kannerhomas  Dalessio M.D.   On: 10/28/2013 16:45    CBC  Recent Labs Lab 10/28/13 1501 10/30/13 0415 10/31/13 0641  WBC 12.7* 17.6* 14.7*  HGB 14.4 13.1 14.3  HCT 43.3 38.5* 41.8  PLT 275 227 252  MCV 85.1 83.0 82.1  MCH 28.3 28.2 28.1  MCHC 33.3 34.0 34.2  RDW 14.6 14.6 14.3  LYMPHSABS 1.2  --   --   MONOABS 0.3  --   --   EOSABS 0.0  --   --   BASOSABS 0.0  --   --     Chemistries   Recent Labs Lab 10/28/13 1501   10/28/13 2332 10/29/13 0157 10/29/13 0345 10/30/13 0415 10/31/13 0641  NA 144  < > 145 146 148* 138 140  K 5.2  < >  4.2 4.0 3.7 3.5* 3.6*  CL 99  < > 103 107 110 101 102  CO2 29  < > 23 27 27 25 26   GLUCOSE 446*  < > 371* 222* 112* 175* 135*  BUN 12  < > 13 13 12 9 13   CREATININE 0.77  < > 0.77 0.79 0.79 0.69 0.86  CALCIUM 9.8  < > 9.1 9.1 9.4 9.0 9.1  AST 19  --   --   --   --   --   --   ALT 15  --   --   --   --   --   --   ALKPHOS 157*  --   --   --   --   --   --   BILITOT 0.4  --   --   --   --   --   --   < > = values in this interval not displayed. ------------------------------------------------------------------------------------------------------------------ estimated creatinine clearance is 100.4 ml/min (by C-G formula based on Cr of 0.86). ------------------------------------------------------------------------------------------------------------------  Recent Labs  10/30/13 0415  HGBA1C 10.5*   ------------------------------------------------------------------------------------------------------------------ No results found for this basename: CHOL, HDL, LDLCALC, TRIG, CHOLHDL, LDLDIRECT,  in the last 72 hours ------------------------------------------------------------------------------------------------------------------ No results found for this basename: TSH, T4TOTAL, FREET3, T3FREE, THYROIDAB,  in the last 72 hours ------------------------------------------------------------------------------------------------------------------ No results found for this basename: VITAMINB12, FOLATE, FERRITIN, TIBC, IRON, RETICCTPCT,  in the last 72 hours  Coagulation profile No results found for this basename: INR, PROTIME,  in the last 168 hours  No results found for this basename: DDIMER,  in the last 72 hours  Cardiac Enzymes No results found for this basename: CK, CKMB, TROPONINI, MYOGLOBIN,  in the last 168  hours ------------------------------------------------------------------------------------------------------------------ No components found with this basename: POCBNP,     Danny Vargas D.O. on 10/31/2013 at 12:51 PM  Between 7am to 7pm - Pager - [phone]  After 7pm go to [email or web address] - password [email or web address]  And look for the night coverage person covering for me after hours  [facility] Office  [phone]

## 2013-11-01 ENCOUNTER — Inpatient Hospital Stay (HOSPITAL_COMMUNITY): Payer: BC Managed Care – PPO

## 2013-11-01 LAB — CBC
HCT: 42.8 % (ref 39.0–52.0)
Hemoglobin: 14.5 g/dL (ref 13.0–17.0)
MCH: 27.9 pg (ref 26.0–34.0)
MCHC: 33.9 g/dL (ref 30.0–36.0)
MCV: 82.3 fL (ref 78.0–100.0)
Platelets: 234 K/uL (ref 150–400)
RBC: 5.2 MIL/uL (ref 4.22–5.81)
RDW: 14.3 % (ref 11.5–15.5)
WBC: 11.6 K/uL — ABNORMAL HIGH (ref 4.0–10.5)

## 2013-11-01 LAB — GLUCOSE, CAPILLARY
GLUCOSE-CAPILLARY: 157 mg/dL — AB (ref 70–99)
GLUCOSE-CAPILLARY: 142 mg/dL — AB (ref 70–99)
Glucose-Capillary: 160 mg/dL — ABNORMAL HIGH (ref 70–99)
Glucose-Capillary: 192 mg/dL — ABNORMAL HIGH (ref 70–99)
Glucose-Capillary: 197 mg/dL — ABNORMAL HIGH (ref 70–99)
Glucose-Capillary: 223 mg/dL — ABNORMAL HIGH (ref 70–99)
Glucose-Capillary: 147 mg/dL — ABNORMAL HIGH (ref 70–99)

## 2013-11-01 LAB — BASIC METABOLIC PANEL WITH GFR
Anion gap: 13 (ref 5–15)
BUN: 12 mg/dL (ref 6–23)
CO2: 26 meq/L (ref 19–32)
Calcium: 9 mg/dL (ref 8.4–10.5)
Chloride: 101 meq/L (ref 96–112)
Creatinine, Ser: 0.86 mg/dL (ref 0.50–1.35)
GFR calc Af Amer: >90 mL/min (ref >90)
GFR calc non Af Amer: >90 mL/min (ref >90)
Glucose, Bld: 136 mg/dL — ABNORMAL HIGH (ref 70–99)
Potassium: 4.1 meq/L (ref 3.7–5.3)
Sodium: 140 meq/L (ref 137–147)

## 2013-11-01 MED ORDER — METOCLOPRAMIDE HCL 10 MG PO TABS: 10.0000 mg | ORAL_TABLET | Freq: Three times a day (TID) | ORAL | Status: DC

## 2013-11-01 MED ORDER — ONDANSETRON HCL 4 MG PO TABS: 4.0000 mg | ORAL_TABLET | Freq: Three times a day (TID) | ORAL | Status: DC | PRN

## 2013-11-01 NOTE — Discharge Summary (Signed)
Physician Discharge Summary  Lynne LeaderScottie O Senn ZOX:[id number] DOB: Mar 17, 1971 DOA: 10/28/2013  PCP: No primary provider on file.  Admit date: 10/28/2013 Discharge date: 11/01/2013  Time spent: 45 minutes  Recommendations for Outpatient Follow-up:  Patient will be discharged home. He is to followup with his primary care physician within one week of discharge. Patient to continue taking his medications as prescribed. Patient was instructed to continue using his insulin pump, and his home basal rate and tender his carbs appropriately. Patient was also counseled against using any illicit drugs.   Patient to follow a heart healthy/carb modified diet.  Discharge Diagnoses:  Principal Problem:   DKA (diabetic ketoacidoses) Active Problems:   Diabetes   GERD (gastroesophageal reflux disease)   Nausea & vomiting   Leukocytosis   Hyperlipidemia   Hypertension   Drug abuse   Hypokalemia  Discharge Condition: Stable  Diet recommendation: Carb modified/heart healthy  Filed Weights   10/28/13 2120 10/29/13 0500  Weight: 64.1 kg (141 lb 5 oz) 64.1 kg (141 lb 5 oz)    History of present illness:  Danny Vargas is a 43 y.o. male diabetic who is on an insulin pump states that he took his insulin pump off yesterday as his sugar was running low. When he awoke, he was having nausea and vomiting and was basically unable to keep anything down. On initial evaluation in the ED he was noted to have a glucose of 446 and his anion gap was also elevated. Patient was treated with insulin and was hydrated with saline bolus. He inititially was going to be discharged but he has had increased vomiting and is not able to keep anything down at all. Patient has been having an elevation of his glucose again after an initial decline. Due to this, patient was admitted. He denied fevers and diarrhea. He denied having any cough or congestion. No urinary symptoms. He stated that he has had no rashes no other exposures.  Hospital  Course:  Diabetic ketoacidosis  -Patient has an insulin pump and admits to not entering his carbs correctly or eating a proper carb modified diet  -Anion Gap has closed, however patient continues to have nausea  -No longer on insulin drip, and patient currently on Lantus and novolog  -HbA1c 10.5  -Patient will need to follow up with his PCP/endocrinologist within one week. He should continue his basal rate as set on his insulin pump and enter carbs correctly.    Nausea and vomiting  -Secondary to DKA/ gastroparesis vs ?gastroenteritis  -Abdominal series with chest: negative exam -US abdomen: small polyps- benign and unchanged since 2011 -Continue supportive care with antiemetics and metaclompramide -Continue clear liquid diet and advance to carb modified/heart healthy diet as tolerated  Leukocytosis  -Improving without antibiotics, likely reactive vs infection  -etiology unknown  -Abd series with chest negative for infection/infiltrate  -UA negative for infection   Hyperlipidemia  -Continue statin   Hypertension  -Continue lisinopril   Drug abuse  -toxicology screen positive for THC  -Patient counseled   Hypokalemia  -Resolved  Procedures: Abdominal US Small gallbladder polyps measuring up to 3-4 mm, unchanged since 2011, benign. Otherwise negative abdominal ultrasound.  Consultations: None  Discharge Exam: Filed Vitals:   11/01/13 0644  BP: 126/86  Pulse: 95  Temp: 98.3 F (36.8 C)  Resp: 20   Exam  General: Well developed, well nourished, NAD, appears stated age  HEENT: NCAT,mucous membranes moist.  Neck: Supple, no JVD, no masses  Cardiovascular: S1 S2  auscultated, no rubs, murmurs or gallops. Regular rate and rhythm.  Respiratory: Clear to auscultation bilaterally with equal chest rise  Abdomen: Soft, nontender, nondistended, + bowel sounds  Extremities: warm dry without cyanosis clubbing or edema  Neuro: AAOx3, no focal deficits  Skin: Without rashes  exudates or nodules, tattoos  Psych: Normal affect and demeanor with intact judgement and insight  Discharge Instructions      Discharge Instructions   Discharge instructions    Complete by:  As directed   Patient will be discharged home. He is to followup with his primary care physician within one week of discharge. Patient to continue taking his medications as prescribed. Patient was instructed to continue using his insulin pump, and his home basal rate and tender his carbs appropriately. Patient was also counseled against using any illicit drugs. Patient to follow a heart healthy/carb modified diet.            Medication List         aspirin EC 81 MG tablet  Take 81 mg by mouth every other day.     insulin aspart 100 UNIT/ML injection  Commonly known as:  novoLOG  - Inject into the skin 3 (three) times daily before meals. As directed per sliding scale instructions. Per patient:  - If levels are:   - 100-150= 2 units  - 151-200=3 units  - 201-250=4 units, and so forth as directed     lisinopril 5 MG tablet  Commonly known as:  PRINIVIL,ZESTRIL  Take 5 mg by mouth daily.     lovastatin 20 MG tablet  Commonly known as:  MEVACOR  Take 20 mg by mouth daily.     metoCLOPramide 10 MG tablet  Commonly known as:  REGLAN  Take 1 tablet (10 mg total) by mouth 3 (three) times daily before meals.     ondansetron 4 MG tablet  Commonly known as:  ZOFRAN  Take 1 tablet (4 mg total) by mouth every 8 (eight) hours as needed for nausea or vomiting.       Allergies  Allergen Reactions  . Peanut-Containing Drug Products Itching    Not all peanuts trigger the allergic reaction of itching in the throat and ears   Follow-up Information   Follow up with Primary Care physician. Schedule an appointment as soon as possible for a visit in 1 week. [facility] follow up and insulin management)        The results of significant diagnostics from this hospitalization (including imaging,  microbiology, ancillary and laboratory) are listed below for reference.    Significant Diagnostic Studies: Dg Abd Acute W/chest  10/28/2013   CLINICAL DATA:  Nausea, vomiting and weakness.  EXAM: ACUTE ABDOMEN SERIES (ABDOMEN 2 VIEW & CHEST 1 VIEW)  COMPARISON:  Two views of the abdomen 08/18/2012. Single view of the chest 07/23/2013.  FINDINGS: Single view of the chest demonstrates clear lungs and normal heart size. No pneumothorax or pleural effusion. No focal bony abnormality.  Two views of the abdomen show no free intraperitoneal air. The bowel gas pattern is unremarkable. No abnormal abdominal calcification or focal bony abnormality is identified.  IMPRESSION: Negative exam.   Electronically Signed   By: Drusilla Kanner M.D.   On: 10/28/2013 16:45    Microbiology: Recent Results (from the past 240 hour(s))  MRSA PCR SCREENING     Status: None   Collection Time    10/28/13  9:45 PM      Result Value Ref Range Status  MRSA by PCR NEGATIVE  NEGATIVE Final   Comment:            The GeneXpert MRSA Assay (FDA     approved for NASAL specimens     only), is one component of a     comprehensive MRSA colonization     surveillance program. It is not     intended to diagnose MRSA     infection nor to guide or     monitor treatment for     MRSA infections.     Labs: Basic Metabolic Panel:  Recent Labs Lab 10/29/13 0157 10/29/13 0345 10/30/13 0415 10/31/13 0641 11/01/13 0815  NA 146 148* 138 140 140  K 4.0 3.7 3.5* 3.6* 4.1  CL 107 110 101 102 101  CO2 27 27 25 26 26   GLUCOSE 222* 112* 175* 135* 136*  BUN 13 12 9 13 12   CREATININE 0.79 0.79 0.69 0.86 0.86  CALCIUM 9.1 9.4 9.0 9.1 9.0   Liver Function Tests:  Recent Labs Lab 10/28/13 1501  AST 19  ALT 15  ALKPHOS 157*  BILITOT 0.4  PROT 7.5  ALBUMIN 4.1    Recent Labs Lab 10/28/13 1501  LIPASE 61*   No results found for this basename: AMMONIA,  in the last 168 hours CBC:  Recent Labs Lab 10/28/13 1501  10/30/13 0415 10/31/13 0641 11/01/13 0815  WBC 12.7* 17.6* 14.7* 11.6*  NEUTROABS 11.1*  --   --   --   HGB 14.4 13.1 14.3 14.5  HCT 43.3 38.5* 41.8 42.8  MCV 85.1 83.0 82.1 82.3  PLT 275 227 252 234   Cardiac Enzymes: No results found for this basename: CKTOTAL, CKMB, CKMBINDEX, TROPONINI,  in the last 168 hours BNP: BNP (last 3 results) No results found for this basename: PROBNP,  in the last 8760 hours CBG:  Recent Labs Lab 10/31/13 1640 10/31/13 2116 11/01/13 0012 11/01/13 0430 11/01/13 0730  GLUCAP 215* 155* 160* 157* 142*       Signed:  Latese Dufault  [facility] 11/01/2013, 1:02 PM

## 2013-11-01 NOTE — Progress Notes (Signed)
Patient stable at time of discharge. Patient left with family in private vehicle.

## 2013-11-01 NOTE — Progress Notes (Signed)
Patient states understanding of discharge instructions.  

## 2013-11-01 NOTE — Discharge Instructions (Signed)
Diabetic Ketoacidosis Diabetic ketoacidosis (DKA) is a life-threatening complication of type 1 diabetes. It must be quickly recognized and treated. Treatment requires hospitalization. CAUSES  When there is no insulin in the body, glucose (sugar) cannot be used, and the body breaks down fat for energy. When fat breaks down, acids (ketones) build up in the blood. Very high levels of glucose and high levels of acids lead to severe loss of body fluids (dehydration) and other dangerous chemical changes. This stresses your vital organs and can cause coma or death. SIGNS AND SYMPTOMS   Tiredness (fatigue).  Weight loss.  Excessive thirst.  Ketones in your urine.  Light-headedness.  Fruity or sweet smelling breath.  Excessive urination.  Visual changes.  Confusion or irritability.  Nausea or vomiting.  Rapid breathing.  Stomachache or abdominal pain. DIAGNOSIS  Your health care provider will diagnose DKA based on your history, physical exam, and blood tests. The health care provider will check to see if you have another illness that caused you to go into DKA. Most of this will be done quickly in an emergency room. TREATMENT   Fluid replacement to correct dehydration.  Insulin.  Correction of electrolytes, such as potassium and sodium.  Antibiotic medicines. PREVENTION  Always take your insulin. Do not skip your insulin injections.  If you are sick, treat yourself quickly. Your body often needs more insulin to fight the illness.  Check your blood glucose regularly.  Check urine ketones if your blood glucose is greater than 240 milligrams per deciliter (mg/dL).  Do not use outdated (expired) insulin.  If your blood glucose is high, drink plenty of fluids. This helps flush out ketones. HOME CARE INSTRUCTIONS   If you are sick, follow the advice of your health care provider.  To prevent dehydration, drink enough water and fluids to keep your urine clear or pale  yellow.  If you cannot eat, alternate between drinking fluids with sugar (soda, juices, flavored gelatin) and salty fluids (broth, bouillon).  If you can eat, follow your usual diet and drink sugar-free liquids (water, diet drinks).  Always take your usual dose of insulin. If you cannot eat or if your glucose is getting too low, call your health care provider for further instructions.  Continue to monitor your blood or urine ketones every 3-4 hours around the clock. Set your alarm clock or have someone wake you up. If you are too sick, have someone test it for you.  Rest and avoid exercise. SEEK MEDICAL CARE IF:   You have a fever.  You have ketones in your urine, or your blood glucose is higher than a level your health care provider suggests. You may need extra insulin. Call your health care provider if you need advice on adjusting your insulin.  You cannot drink at least a tablespoon (15 mL) of fluid every 15-20 minutes.  You have been vomiting for more than 2 hours.  You have symptoms of DKA:  Fruity smelling breath.  Breathing faster or slower.  Becoming very sleepy. SEEK IMMEDIATE MEDICAL CARE IF:   You have signs of dehydration:  Decreased urination.  Increased thirst.  Dry skin and mouth.  Light-headedness.  Your blood glucose is very high (as advised by your health care provider) twice in a row.  You faint.  You have chest pain or trouble breathing.  You have a sudden, severe headache.  You have sudden weakness in one arm or one leg.  You have sudden trouble speaking or swallowing.  You  have vomiting or diarrhea that is getting worse after 3 hours.  You have abdominal pain. MAKE SURE YOU:   Understand these instructions.  Will watch your condition.  Will get help right away if you are not doing well or get worse. Document Released: 04/13/2000 Document Revised: 04/21/2013 Document Reviewed: 10/20/2008 [facility] Patient Information 2015 [place],  [place]. This information is not intended to replace advice given to you by your health care provider. Make sure you discuss any questions you have with your health care provider.  Insulin Pumps An insulin pump is a small (smaller than a deck of cards), battery-operated device that continuously delivers insulin to the body. It also delivers doses of insulin with meals.  An insulin pump contains a reservoir (cartridge) of insulin that needs to be refilled regularly. Usually, a small plastic tube connects the pump to a needle (some pumps are self-contained). The needle is placed into the skin of your abdomen, buttocks, or thighs. The insulin pump can be used by people who have type 1 diabetes and those who have type 2 diabetes requiring insulin.  WHAT ARE THE ADVANTAGES OF USING AN INSULIN PUMP? The advantages of using an insulin pump may vary depending on the type of diabetes you have. They may include:  Reduced need to insert a needle into your skin. With an insulin pump, you need to insert a needle every 2-3 days instead of every time you give yourself an insulin injection.   More control over your diabetes because you are the one making decisions about insulin dosages.   Reduced risk of low blood glucose (hypoglycemia).   Better management of rises in blood sugar in the early morning (dawn phenomenon).   More lifestyle flexibility by allowing meals and snacks as well as periods of exercise to fit into your schedule.  Insulin doses that are more precise, which may mean an improvement in blood sugar levels.  More predictable absorption rate because pumps use only rapid-acting insulin. WHAT ARE THE DISADVANTAGES OF USING AN INSULIN PUMP?  The cost of the pump and related supplies may be higher than the cost of the supplies used to give yourself insulin injections.  Blood glucose levels need to be checked more often than when you give yourself insulin injections. You need to be highly  motivated and be able to problem-solve.  You have a small insulin delivery device attached to you wherever you go 24 hours a day. WHAT ARE THE RISKS OF USING AN INSULIN PUMP?  The infusion site can get irritated or infected. Proper skin care and site rotation can help you avoid this.  The pump can fail. This can occur because of a malfunction (such as a blockage of tubing, accidental dislodgement of the insulin needle delivery site, or the pump running out of insulin). Most pumps have alarms to warn you of a malfunction. However, if the pump fails and you are unaware that it has failed, you may develop a life-threatening condition from lack of insulin.   Hypoglycemia may develop if your pump delivers too much insulin. WHAT ARE POSSIBLE CAUSES OF HIGH BLOOD SUGAR WHEN USING AN INSULIN PUMP?  When using an insulin pump, you need to be aware of the possible causes of high blood sugar (hyperglycemia). These may include:  Broken or leaking tubing.  The battery needs changing.  Infected infusion site (look for a red, swollen, sore area).  The insulin reservoir is empty.  The reservoir is loaded improperly into the pump.  The  basal rate needs to be corrected.  Air bubbles in the tubing.  Illness or stress.  Hormone fluctuations such as those associated with the menstrual cycle.  A bolus was omitted, delivery was interrupted, or the wrong amount of insulin was given.  Poor absorption at the site (even if the site was recently changed).  Insulin is not effective (outdated or exposed to heat or cold).  Catheter becomes bent or dislodged.  A change in normal activity levels. HOME CARE INSTRUCTIONS   Monitor your blood glucose levels at least 4 times a day.   Check your blood glucose levels before changing the rate of glucose delivery.   Check your hemoglobin A1c level at regular intervals.   Refill the cartridge as often as necessary to ensure that you have a steady delivery  of insulin.   Always carry extra pump supplies in case there is a problem with your pump. Also have access to extra syringes and insulin so you can give yourself an injection if needed.   Call your health care provider or the pump manufacturer's customer service line if there is a problem you cannot solve or if you have any questions about the function of your pump.   Continue to follow the basics of diabetes care. Remember to:   Exercise consistently.   Keep consistent meal times.  Eat consistent portion sizes.   Control your weight.  Correct known problems immediately.  If you give yourself a correction dose of insulin and your blood sugar remains elevated, check your urine for ketones. If positive for ketones, give yourself a dose of insulin using an insulin syringe and needle. If ketones are not present, check your pump to see if it is functioning properly. If you cannot detect a problem with your pump, change the pump site, the insulin reservoir, and the infusion set. SEEK MEDICAL CARE IF:   You have pain or redness at the insulin infusion site.   You have a red streak on your skin going away from the infusion site.   You have pus or cloudy fluid coming from the infusion site.   You have a fever.   Your pump is not working.   Your blood glucose is not controlled within the guidelines set by your health care provider. Document Released: 07/23/2000 Document Revised: 02/04/2013 Document Reviewed: 10/04/2012 [facility] Patient Information 2015 [place]. This information is not intended to replace advice given to you by your health care provider. Make sure you discuss any questions you have with your health care provider. Gastroparesis  Gastroparesis is also called slowed stomach emptying (delayed gastric emptying). It is a condition in which the stomach takes too long to empty its contents. It often happens in people with diabetes.  CAUSES  Gastroparesis happens  when nerves to the stomach are damaged or stop working. When the nerves are damaged, the muscles of the stomach and intestines do not work normally. The movement of food is slowed or stopped. High blood glucose (sugar) causes changes in nerves and can damage the blood vessels that carry oxygen and nutrients to the nerves. RISK FACTORS  Diabetes.  Post-viral syndromes.  Eating disorders (anorexia, bulimia).  Surgery on the stomach or vagus nerve.  Gastroesophageal reflux disease (rarely).  Smooth muscle disorders (amyloidosis, scleroderma).  Metabolic disorders, including hypothyroidism.  Parkinson disease. SYMPTOMS   Heartburn.  Feeling sick to your stomach (nausea).  Vomiting of undigested food.  An early feeling of fullness when eating.  Weight loss.  Abdominal  bloating.  Erratic blood glucose levels.  Lack of appetite.  Gastroesophageal reflux.  Spasms of the stomach wall. Complications can include:  Bacterial overgrowth in stomach. Food stays in the stomach and can ferment and cause bacteria to grow.  Weight loss due to difficulty digesting and absorbing nutrients.  Vomiting.  Obstruction in the stomach. Undigested food can harden and cause nausea and vomiting.  Blood glucose fluctuations caused by inconsistent food absorption. DIAGNOSIS  The diagnosis of gastroparesis is confirmed through one or more of the following tests:  Barium X-rays and scans. These tests look at how long it takes for food to move through the stomach.  Gastric manometry. This test measures electrical and muscular activity in the stomach. A thin tube is passed down the throat into the stomach. The tube contains a wire that takes measurements of the stomach's electrical and muscular activity as it digests liquids and solid food.  Endoscopy. This procedure is done with a long, thin tube called an endoscope. It is passed through the mouth and gently down the esophagus into the stomach.  This tube helps the caregiver look at the lining of the stomach to check for any abnormalities.  Ultrasonography. This can rule out gallbladder disease or pancreatitis. This test will outline and define the shape of the gallbladder and pancreas. TREATMENT   Treatments may include:  Exercise.  Medicines to control nausea and vomiting.  Medicines to stimulate stomach muscles.  Changes in what and when you eat.  Having smaller meals more often.  Eating low-fiber forms of high-fiber foods, such as eating cooked vegetables instead of raw vegetables.  Eating low-fat foods.  Consuming liquids, which are easier to digest.  In severe cases, feeding tubes and intravenous (IV) feeding may be needed. It is important to note that in most cases, treatment does not cure gastroparesis. It is usually a lasting (chronic) condition. Treatment helps you manage the underlying condition so that you can be as healthy and comfortable as possible. Other treatments  A gastric neurostimulator has been developed to assist people with gastroparesis. The battery-operated device is surgically implanted. It emits mild electrical pulses to help improve stomach emptying and to control nausea and vomiting.  The use of botulinum toxin has been shown to improve stomach emptying by decreasing the prolonged contractions of the muscle between the stomach and the small intestine (pyloric sphincter). The benefits are temporary. SEEK MEDICAL CARE IF:   You have diabetes and you are having problems keeping your blood glucose in goal range.  You are having nausea, vomiting, bloating, or early feelings of fullness with eating.  Your symptoms do not change with a change in diet. Document Released: 04/16/2005 Document Revised: 08/11/2012 Document Reviewed: 09/23/2008 [facility] Patient Information 2015 [place], [place]. This information is not intended to replace advice given to you by your health care provider. Make sure you  discuss any questions you have with your health care provider.

## 2013-11-02 NOTE — Progress Notes (Signed)
UR chart review completed.  

## 2014-02-04 ENCOUNTER — Encounter (HOSPITAL_COMMUNITY): Payer: Self-pay | Admitting: Emergency Medicine

## 2014-02-04 ENCOUNTER — Emergency Department (HOSPITAL_COMMUNITY)
Admission: EM | Admit: 2014-02-04 | Discharge: 2014-02-04 | Disposition: A | Discharge status: Home / Self Care | Payer: BC Managed Care – PPO | Attending: Emergency Medicine | Admitting: Emergency Medicine

## 2014-02-04 DIAGNOSIS — R112 Nausea with vomiting, unspecified: Secondary | ICD-10-CM | POA: Insufficient documentation

## 2014-02-04 DIAGNOSIS — Z720 Tobacco use: Secondary | ICD-10-CM | POA: Insufficient documentation

## 2014-02-04 DIAGNOSIS — Z7982 Long term (current) use of aspirin: Secondary | ICD-10-CM | POA: Insufficient documentation

## 2014-02-04 DIAGNOSIS — Z794 Long term (current) use of insulin: Secondary | ICD-10-CM | POA: Insufficient documentation

## 2014-02-04 DIAGNOSIS — E119 Type 2 diabetes mellitus without complications: Secondary | ICD-10-CM | POA: Diagnosis not present

## 2014-02-04 DIAGNOSIS — Z8719 Personal history of other diseases of the digestive system: Secondary | ICD-10-CM | POA: Diagnosis not present

## 2014-02-04 DIAGNOSIS — Z79899 Other long term (current) drug therapy: Secondary | ICD-10-CM | POA: Diagnosis not present

## 2014-02-04 DIAGNOSIS — R739 Hyperglycemia, unspecified: Secondary | ICD-10-CM

## 2014-02-04 LAB — CBC WITH DIFFERENTIAL/PLATELET
Basophils Absolute: 0 10*3/uL (ref 0.0–0.1)
Basophils Relative: 0 % (ref 0–1)
EOS ABS: 0 10*3/uL (ref 0.0–0.7)
Eosinophils Relative: 0 % (ref 0–5)
HCT: 43.8 % (ref 39.0–52.0)
HEMOGLOBIN: 14.9 g/dL (ref 13.0–17.0)
Lymphocytes Relative: 18 % (ref 12–46)
Lymphs Abs: 1.8 10*3/uL (ref 0.7–4.0)
MCH: 28.3 pg (ref 26.0–34.0)
MCHC: 34 g/dL (ref 30.0–36.0)
MCV: 83.3 fL (ref 78.0–100.0)
MONOS PCT: 5 % (ref 3–12)
Monocytes Absolute: 0.5 10*3/uL (ref 0.1–1.0)
NEUTROS ABS: 7.4 10*3/uL (ref 1.7–7.7)
Neutrophils Relative %: 77 % (ref 43–77)
Platelets: 275 10*3/uL (ref 150–400)
RBC: 5.26 MIL/uL (ref 4.22–5.81)
RDW: 14.4 % (ref 11.5–15.5)
WBC: 9.7 10*3/uL (ref 4.0–10.5)

## 2014-02-04 LAB — URINALYSIS, ROUTINE W REFLEX MICROSCOPIC
BILIRUBIN URINE: NEGATIVE
Glucose, UA: >1000 mg/dL — AB
HGB URINE DIPSTICK: NEGATIVE
Ketones, ur: 40 mg/dL — AB
Leukocytes, UA: NEGATIVE
Nitrite: NEGATIVE
Protein, ur: NEGATIVE mg/dL
SPECIFIC GRAVITY, URINE: 1.01 (ref 1.005–1.030)
UROBILINOGEN UA: 0.2 mg/dL (ref 0.0–1.0)
pH: 7.5 (ref 5.0–8.0)

## 2014-02-04 LAB — COMPREHENSIVE METABOLIC PANEL
ALK PHOS: 134 U/L — AB (ref 39–117)
ALT: 11 U/L (ref 0–53)
ANION GAP: 17 — AB (ref 5–15)
AST: 14 U/L (ref 0–37)
Albumin: 4.3 g/dL (ref 3.5–5.2)
BILIRUBIN TOTAL: 0.5 mg/dL (ref 0.3–1.2)
BUN: 11 mg/dL (ref 6–23)
CO2: 28 mEq/L (ref 19–32)
Calcium: 9.7 mg/dL (ref 8.4–10.5)
Chloride: 96 mEq/L (ref 96–112)
Creatinine, Ser: 0.89 mg/dL (ref 0.50–1.35)
GFR calc non Af Amer: >90 mL/min (ref >90)
GLUCOSE: 369 mg/dL — AB (ref 70–99)
POTASSIUM: 4.2 meq/L (ref 3.7–5.3)
Sodium: 141 mEq/L (ref 137–147)
Total Protein: 7.9 g/dL (ref 6.0–8.3)

## 2014-02-04 LAB — URINE MICROSCOPIC-ADD ON

## 2014-02-04 LAB — CBG MONITORING, ED
Glucose-Capillary: 286 mg/dL — ABNORMAL HIGH (ref 70–99)
Glucose-Capillary: 343 mg/dL — ABNORMAL HIGH (ref 70–99)

## 2014-02-04 MED ORDER — SODIUM CHLORIDE 0.9 % IV BOLUS (SEPSIS)
2000.0000 mL | Freq: Once | INTRAVENOUS | Status: AC
  Administered 2014-02-04: 2000 mL via INTRAVENOUS

## 2014-02-04 MED ORDER — ONDANSETRON 4 MG PO TBDP: 4.0000 mg | ORAL_TABLET | Freq: Three times a day (TID) | ORAL | Status: DC | PRN

## 2014-02-04 MED ORDER — ONDANSETRON HCL 4 MG/2ML IJ SOLN
4.0000 mg | Freq: Once | INTRAMUSCULAR | Status: AC
Start: 2014-02-04 — End: 2014-02-04
  Administered 2014-02-04: 4 mg via INTRAVENOUS
  Filled 2014-02-04: qty 2

## 2014-02-04 NOTE — ED Notes (Signed)
Patient verbalizes understanding of discharge instructions, home care and follow up care if needed. Patient discharged out of department at this time.

## 2014-02-04 NOTE — ED Notes (Signed)
Pt c/o n/v after he eats. Pt has insulin pump and took off tonight due to blood sugar being in 80s. Per ems pt's blood sugar was 441.

## 2014-02-04 NOTE — ED Notes (Signed)
Gave patient Diet Ginger Ale for PO fluid challenge

## 2014-02-04 NOTE — ED Notes (Signed)
Went to re-evaluate patient for PO challenge. Patient lying in bed, eyes closed. Patient states "i didn't want to throw up, so i didn't drink a lot of it"

## 2014-02-04 NOTE — ED Provider Notes (Signed)
TIME SEEN: 12:15 AM  CHIEF COMPLAINT: Hyperglycemia, nausea and vomiting  HPI: Patient is a 43 year old male with history of GERD, insulin-dependent diabetes, hypertension who presents emergency department with complaints of nausea and vomiting x2 today and hyperglycemia. Patient reports that he had one episode of nonbloody, nonbilious vomiting at 2 PM today. He states he had another episode around 8 PM. Denies any diarrhea. No fevers or chills. No abdominal pain. No dysuria or hematuria. No sick contacts or recent travel, hospitalization or antibiotic use. Reports that earlier today his blood sugar was in the 80s and he took off his insulin pump. Per EMS, patient blood sugar was 441. In the emergency department is 343. Patient reports he has had DKA in the past.  ROS: See HPI Constitutional: no fever  Eyes: no drainage  ENT: no runny nose   Cardiovascular:  no chest pain  Resp: no SOB  GI:  vomiting GU: no dysuria Integumentary: no rash  Allergy: no hives  Musculoskeletal: no leg swelling  Neurological: no slurred speech ROS otherwise negative  PAST MEDICAL HISTORY/PAST SURGICAL HISTORY:  Past Medical History  Diagnosis Date  . GERD (gastroesophageal reflux disease)   . Diabetes mellitus     MEDICATIONS:  Prior to Admission medications   Medication Sig Start Date End Date Taking? Authorizing Provider  aspirin EC 81 MG tablet Take 81 mg by mouth every other day.   Yes Historical Provider, MD  insulin aspart (NOVOLOG) 100 UNIT/ML injection Inject into the skin 3 (three) times daily before meals. As directed per sliding scale instructions. Per patient: If levels are:  100-150= 2 units 151-200=3 units 201-250=4 units, and so forth as directed   Yes Historical Provider, MD  lisinopril (PRINIVIL,ZESTRIL) 5 MG tablet Take 5 mg by mouth daily.   Yes Historical Provider, MD  lovastatin (MEVACOR) 20 MG tablet Take 20 mg by mouth daily.   Yes Historical Provider, MD  metoCLOPramide  (REGLAN) 10 MG tablet Take 1 tablet (10 mg total) by mouth 3 (three) times daily before meals. 11/01/13  Yes Maryann Mikhail, DO  ondansetron (ZOFRAN) 4 MG tablet Take 1 tablet (4 mg total) by mouth every 8 (eight) hours as needed for nausea or vomiting. 11/01/13   Maryann Mikhail, DO    ALLERGIES:  Allergies  Allergen Reactions  . Peanut-Containing Drug Products Itching    Not all peanuts trigger the allergic reaction of itching in the throat and ears    SOCIAL HISTORY:  History  Substance Use Topics  . Smoking status: Current Every Day Smoker -- 1.00 packs/day  . Smokeless tobacco: Not on file  . Alcohol Use: No    FAMILY HISTORY: History reviewed. No pertinent family history.  EXAM: BP 130/79  Pulse 100  Temp(Src) 98 F (36.7 C) (Oral)  Resp 20  Ht 6\' 1"  (1.854 m)  Wt 150 lb (68.04 kg)  BMI 19.79 kg/m2  SpO2 97% CONSTITUTIONAL: Alert and oriented and responds appropriately to questions. Well-appearing; well-nourished HEAD: Normocephalic EYES: Conjunctivae clear, PERRL ENT: normal nose; no rhinorrhea; moist mucous membranes; pharynx without lesions noted NECK: Supple, no meningismus, no LAD  CARD: RRR; S1 and S2 appreciated; no murmurs, no clicks, no rubs, no gallops RESP: Normal chest excursion without splinting or tachypnea; breath sounds clear and equal bilaterally; no wheezes, no rhonchi, no rales,  ABD/GI: Normal bowel sounds; non-distended; soft, non-tender, no rebound, no guarding no peritoneal signs BACK:  The back appears normal and is non-tender to palpation, there is no  CVA tenderness EXT: Normal ROM in all joints; non-tender to palpation; no edema; normal capillary refill; no cyanosis    SKIN: Normal color for age and race; warm NEURO: Moves all extremities equally PSYCH: The patient's mood and manner are appropriate. Grooming and personal hygiene are appropriate.  MEDICAL DECISION MAKING: Patient here with nausea, vomiting and hyperglycemia. We'll obtain labs  to ensure patient is not a DKA. His abdominal exam is completely benign. He believes he may have been told he has gastroparesis. We'll give IV fluids, Zofran and reassess. I do not feel he needs abdominal imaging.  ED PROGRESS: Patient's labs are unremarkable other than a mildly elevated anion gap of 17. His bicarbonate is 28. Will give IV fluids and reassess.   His blood sugar has improved to 286 with IV fluids. He has not had any vomiting in the ED. We'll by mouth challenge.   Patient has not vomited the ED after receiving Zofran. He has tolerated by mouth. We'll discharge home with return precautions, prescription for Zofran outpatient followup information. He verbalizes understanding.  Layla Maw Ward, DO 02/04/14 [phone]

## 2014-02-04 NOTE — Discharge Instructions (Signed)
Hyperglycemia °Hyperglycemia occurs when the glucose (sugar) in your blood is too high. Hyperglycemia can happen for many reasons, but it most often happens to people who do not know they have diabetes or are not managing their diabetes properly.  °CAUSES  °Whether you have diabetes or not, there are other causes of hyperglycemia. Hyperglycemia can occur when you have diabetes, but it can also occur in other situations that you might not be as aware of, such as: °Diabetes °· If you have diabetes and are having problems controlling your blood glucose, hyperglycemia could occur because of some of the following reasons: °¨ Not following your meal plan. °¨ Not taking your diabetes medications or not taking it properly. °¨ Exercising less or doing less activity than you normally do. °¨ Being sick. °Pre-diabetes °· This cannot be ignored. Before people develop Type 2 diabetes, they almost always have "pre-diabetes." This is when your blood glucose levels are higher than normal, but not yet high enough to be diagnosed as diabetes. Research has shown that some long-term damage to the body, especially the heart and circulatory system, may already be occurring during pre-diabetes. If you take action to manage your blood glucose when you have pre-diabetes, you may delay or prevent Type 2 diabetes from developing. °Stress °· If you have diabetes, you may be "diet" controlled or on oral medications or insulin to control your diabetes. However, you may find that your blood glucose is higher than usual in the hospital whether you have diabetes or not. This is often referred to as "stress hyperglycemia." Stress can elevate your blood glucose. This happens because of hormones put out by the body during times of stress. If stress has been the cause of your high blood glucose, it can be followed regularly by your caregiver. That way he/she can make sure your hyperglycemia does not continue to get worse or progress to  diabetes. °Steroids °· Steroids are medications that act on the infection fighting system (immune system) to block inflammation or infection. One side effect can be a rise in blood glucose. Most people can produce enough extra insulin to allow for this rise, but for those who cannot, steroids make blood glucose levels go even higher. It is not unusual for steroid treatments to "uncover" diabetes that is developing. It is not always possible to determine if the hyperglycemia will go away after the steroids are stopped. A special blood test called an A1c is sometimes done to determine if your blood glucose was elevated before the steroids were started. °SYMPTOMS °· Thirsty. °· Frequent urination. °· Dry mouth. °· Blurred vision. °· Tired or fatigue. °· Weakness. °· Sleepy. °· Tingling in feet or leg. °DIAGNOSIS  °Diagnosis is made by monitoring blood glucose in one or all of the following ways: °· A1c test. This is a chemical found in your blood. °· Fingerstick blood glucose monitoring. °· Laboratory results. °TREATMENT  °First, knowing the cause of the hyperglycemia is important before the hyperglycemia can be treated. Treatment may include, but is not be limited to: °· Education. °· Change or adjustment in medications. °· Change or adjustment in meal plan. °· Treatment for an illness, infection, etc. °· More frequent blood glucose monitoring. °· Change in exercise plan. °· Decreasing or stopping steroids. °· Lifestyle changes. °HOME CARE INSTRUCTIONS  °· Test your blood glucose as directed. °· Exercise regularly. Your caregiver will give you instructions about exercise. Pre-diabetes or diabetes which comes on with stress is helped by exercising. °· Eat wholesome,   balanced meals. Eat often and at regular, fixed times. Your caregiver or nutritionist will give you a meal plan to guide your sugar intake. °· Being at an ideal weight is important. If needed, losing as little as 10 to 15 pounds may help improve blood  glucose levels. °SEEK MEDICAL CARE IF:  °· You have questions about medicine, activity, or diet. °· You continue to have symptoms (problems such as increased thirst, urination, or weight gain). °SEEK IMMEDIATE MEDICAL CARE IF:  °· You are vomiting or have diarrhea. °· Your breath smells fruity. °· You are breathing faster or slower. °· You are very sleepy or incoherent. °· You have numbness, tingling, or pain in your feet or hands. °· You have chest pain. °· Your symptoms get worse even though you have been following your caregiver's orders. °· If you have any other questions or concerns. °Document Released: 10/10/2000 Document Revised: 07/09/2011 Document Reviewed: 08/13/2011 °ExitCare® Patient Information ©2015 [employer]. This information is not intended to replace advice given to you by your health care provider. Make sure you discuss any questions you have with your health care provider. ° °Nausea and Vomiting °Nausea is a sick feeling that often comes before throwing up (vomiting). Vomiting is a reflex where stomach contents come out of your mouth. Vomiting can cause severe loss of body fluids (dehydration). Children and elderly adults can become dehydrated quickly, especially if they also have diarrhea. Nausea and vomiting are symptoms of a condition or disease. It is important to find the cause of your symptoms. °CAUSES  °· Direct irritation of the stomach lining. This irritation can result from increased acid production (gastroesophageal reflux disease), infection, food poisoning, taking certain medicines (such as nonsteroidal anti-inflammatory drugs), alcohol use, or tobacco use. °· Signals from the brain. These signals could be caused by a headache, heat exposure, an inner ear disturbance, increased pressure in the brain from injury, infection, a tumor, or a concussion, pain, emotional stimulus, or metabolic problems. °· An obstruction in the gastrointestinal tract (bowel obstruction). °· Illnesses  such as diabetes, hepatitis, gallbladder problems, appendicitis, kidney problems, cancer, sepsis, atypical symptoms of a heart attack, or eating disorders. °· Medical treatments such as chemotherapy and radiation. °· Receiving medicine that makes you sleep (general anesthetic) during surgery. °DIAGNOSIS °Your caregiver may ask for tests to be done if the problems do not improve after a few days. Tests may also be done if symptoms are severe or if the reason for the nausea and vomiting is not clear. Tests may include: °· Urine tests. °· Blood tests. °· Stool tests. °· Cultures (to look for evidence of infection). °· X-rays or other imaging studies. °Test results can help your caregiver make decisions about treatment or the need for additional tests. °TREATMENT °You need to stay well hydrated. Drink frequently but in small amounts. You may wish to drink water, sports drinks, clear broth, or eat frozen ice pops or gelatin dessert to help stay hydrated. When you eat, eating slowly may help prevent nausea. There are also some antinausea medicines that may help prevent nausea. °HOME CARE INSTRUCTIONS  °· Take all medicine as directed by your caregiver. °· If you do not have an appetite, do not force yourself to eat. However, you must continue to drink fluids. °· If you have an appetite, eat a normal diet unless your caregiver tells you differently. °¨ Eat a variety of complex carbohydrates (rice, wheat, potatoes, bread), lean meats, yogurt, fruits, and vegetables. °¨ Avoid high-fat foods because they are more difficult   to digest.  Drink enough water and fluids to keep your urine clear or pale yellow.  If you are dehydrated, ask your caregiver for specific rehydration instructions. Signs of dehydration may include:  Severe thirst.  Dry lips and mouth.  Dizziness.  Dark urine.  Decreasing urine frequency and amount.  Confusion.  Rapid breathing or pulse. SEEK IMMEDIATE MEDICAL CARE IF:   You have blood  or brown flecks (like coffee grounds) in your vomit.  You have black or bloody stools.  You have a severe headache or stiff neck.  You are confused.  You have severe abdominal pain.  You have chest pain or trouble breathing.  You do not urinate at least once every 8 hours.  You develop cold or clammy skin.  You continue to vomit for longer than 24 to 48 hours.  You have a fever. MAKE SURE YOU:   Understand these instructions.  Will watch your condition.  Will get help right away if you are not doing well or get worse. Document Released: 04/16/2005 Document Revised: 07/09/2011 Document Reviewed: 09/13/2010 [facility] Patient Information 2015 [employer] [place]. This information is not intended to replace advice given to you by your health care provider. Make sure you discuss any questions you have with your health care provider.    Emergency Department Resource Guide 1) Find a Doctor and Pay Out of Pocket Although you won't have to find out who is covered by your insurance plan, it is a good idea to ask around and get recommendations. You will then need to call the office and see if the doctor you have chosen will accept you as a new patient and what types of options they offer for patients who are self-pay. Some doctors offer discounts or will set up payment plans for their patients who do not have insurance, but you will need to ask so you aren't surprised when you get to your appointment.  2) Contact Your Local Health Department Not all health departments have doctors that can see patients for sick visits, but many do, so it is worth a call to see if yours does. If you don't know where your local health department is, you can check in your phone book. The CDC also has a tool to help you locate your state's health department, and many state websites also have listings of all of their local health departments.  3) Find a Walk-in Clinic If your illness is not likely to be very severe  or complicated, you may want to try a walk in clinic. These are popping up all over the country in pharmacies, drugstores, and shopping centers. They're usually staffed by nurse practitioners or physician assistants that have been trained to treat common illnesses and complaints. They're usually fairly quick and inexpensive. However, if you have serious medical issues or chronic medical problems, these are probably not your best option.  No Primary Care Doctor: - Call Health Connect at  [phone] - they can help you locate a primary care doctor that  accepts your insurance, provides certain services, etc. - Physician Referral Service- [phone]  Chronic Pain Problems: Organization         Address  Phone   Notes  [facility]  [phone] Patients need to be referred by their primary care doctor.   Medication Assistance: Organization         Address  Phone   Notes  [facility] [address] [place],  [place] [address] [phone] --Must be a resident of [facility] -- Must have NO insurance coverage whatsoever (no Medicaid/ Medicare, etc.) -- The pt. MUST have a primary care doctor that directs their care regularly and follows them in the community   [facility]  [phone]   [employer]  [phone]    Agencies that provide inexpensive medical care: Organization         Address  Phone   Notes  [facility]  [phone]   [facility]    [phone]   [facility] [address] [place], [place] [address] [phone]   [facility] [address], [place] [phone]   [facility]    [phone]   [facility]    [phone]   [facility]  [address], [place] Phone:  [phone], Fax:  [phone] Hours of Operation:  9 am - 6 pm, M-F.  Also accepts  Medicaid/Medicare and self-pay.  [facility]  [address], [place] Phone: [phone], Fax: [phone]. Hours of Operation:  8:30 am - 5:30 pm, M-F.  Also accepts Medicaid and self-pay.  [facility] [place] [address] [place] Phone: [phone]   [facility] [address] Natasha Bence [place], [place] [phone], Ext. [phone] Mondays & Thursdays: 7-9 AM.  First 15 patients are seen on a first come, first serve basis.    Medicaid-accepting [facility] Providers:  Organization         Address  Phone   Notes  [facility] [address], [place] [phone] Also accepts self-pay patients.  [facility] [address] Laurell Josephs [place], [place]  [phone]   [facility] [address], [place] [phone]   [facility] [address], [place] [phone]   Renaye Rakers [address], [place]   [phone] Only accepts [place] patients after they have their name applied to their card.   Self-Pay (no insurance) in [facility]:  Organization         Address  Phone   Notes  Sickle Cell Patients, [facility] [address] [place], [place] [phone]   [facility] [address] [place], [place] [phone]   [facility]  [address], [place] [phone]   [facility]/Dr. Osei-Bonsu  [address] [place] [address], [place] or [address], [place] [phone] Phone number for both [place] and [place] locations is the same.  [facility] [address], [place] [phone]   [facility] [address] [place] or [address] [phone]   [facility] [address] [place] [phone], phone; [phone], fax Sees patients 1st and 3rd Saturday of every month.  Must not qualify for public or private insurance (i.e. Medicaid, Medicare, [place] Health Choice, Veterans' Benefits)  Household income should be no more than 200% of the poverty level The clinic cannot treat you if you are pregnant or think you are pregnant  Sexually transmitted diseases are not treated at the clinic.    Dental Care: Organization  Address  Phone  Notes  °[facility] [address], [place] [phone] Accepts children up to age 21 who are enrolled in Medicaid or [place] Health Choice; pregnant women with a Medicaid card; and children who have applied for Medicaid or [place] Health Choice, but were declined, whose parents can pay a reduced fee at time of service.  °[facility] [place]  [address], [place] [phone] Accepts children up to age 21 who are enrolled in Medicaid or [place] Health Choice; pregnant women with a Medicaid card; and children who have applied for Medicaid or [place] Health Choice, but were declined, whose parents can pay a reduced fee at time of service.  °[facility] ° [address], [place] [phone]) [phone] Patients are seen by appointment only. Walk-ins are not accepted. [facility] will see patients 18 years of age and older. °Monday - Tuesday (8am-5pm) °Most Wednesdays (8:30-5pm) °$30 per visit, cash only  °[facility] ° [address], [place] [phone]) [phone] Patients are seen by appointment only. Walk-ins are not accepted. [facility] will see patients 18 years of age and older. °One Wednesday Evening (Monthly: Volunteer Based).  $30 per visit, cash only  °[facility]  [phone] for adults; Children under age 4, call Graduate Pediatric Dentistry at [phone]. Children aged  4-14, please call [phone] to request a pediatric application. ° Dental services are provided in all areas of dental care including fillings, crowns and bridges, complete and partial dentures, implants, gum treatment, root canals, and extractions. Preventive care is also provided. Treatment is provided to both adults and children. °Patients are selected via a lottery and there is often a waiting list. °  °[facility] [address], °[place] ° ([phone]) [phone] [email or web address] °  °[facility] [address], [place], [place] ([phone], Ext. [phone] Second and Fourth Thursday of each month, opens at 6:30 AM; Clinic ends at 9 AM.  Patients are seen on a first-come first-served basis, and a limited number are seen during each clinic.  ° °[facility] ° [address], [place], [place] [phone]) [phone]   Eligibility Requirements °You must have lived in [place], [place], or [place] counties for at least the last three months. °  You cannot be eligible for state or federal sponsored healthcare insurance, including Veterans Administration, Medicaid, or Medicare. °  You generally cannot be eligible for healthcare insurance through your employer.  °  How to apply: °Eligibility screenings are held every Tuesday and Wednesday afternoon from 1:00 pm until 4:00 pm. You do not need an appointment for the interview!  °[facility] [address], [place], [place] [phone]   °[facility]  336-342-8273   °[facility]  336-703-3100   °[facility]  336-570-6415   ° °Behavioral Health Resources in the Community: °Intensive Outpatient Programs °Organization         Address  Phone  Notes  °[facility] [address], [place], [place] [phone]   °[facility] [address], [place], [place] [phone]   °[facility] [address],  [place], [place] ° 336-882-2125   °[facility] [address],  °[place], [place] 1-800-853-5163 or 336-641-4981   °Substance Abuse Resources °  Organization         Address  Phone  Notes  °[facility]  336-882-2125   °[facility]  336-784-9470   °[facility]  336-285-9073   °[facility]  336-845-3988   °Residential & Outpatient Substance Abuse Program  1-800-659-3381   °Psychological Services °Organization         Address  Phone  Notes  °[facility]  336- 832-9600   °[facility]  336- 378-7881   °[facility] [address], [place] 1-800-853-5163 or 336-641-4981   ° °Mobile Crisis Teams °Organization         Address  Phone  Notes  °[facility], Mobile Crisis Care Unit  1-877-626-1772   °Assertive °[facility] ° [address] [place], [place] 336-834-9664   °Sharon DeEsch [address] °[place] 336-554-5454   ° °Self-Help/Support Groups °Organization         Address  Phone             Notes  °[facility] [place] - variety of support groups  336- 373-1402 Call for more information  °Narcotics Anonymous (NA), [facility] [address], °[place]  2 meetings at this location  ° °Residential Treatment Programs °Organization         Address  Phone  Notes  °[facility] [address],    °[place]  1-866-801-8205   °[facility] ° [address], [place], [place] [phone]   °[facility] [address], [place] 336-845-3988 Admissions: 8am-3pm M-F  °[facility] [address]    °[place], [place] 336-841-1104   °[facility] [address], [place], [place] 336-379-7146   °[facility] [address]  °[place], [place] 336-285-9073   °[facility] - Intensive Outpatient [address], [place], [place] 336-852-3033   °[facility]  ([facility] [address]  °[place], [place] 1-877-615-2722 or 336-784-9470   °[facility] ([facility]) [address] [place], [place] 336-227-7417 Accepts Medicaid  °[facility] [address]  ° [place] 1-800-659-3381 Substance Abuse/Addiction Treatment  ° °[place] [facility] °Organization         Address  Phone  Notes  °[facility]  (888) 581-9988   °Julie Brannon, PhD [address] [place], [place]   (336) 349-5553 or (336) 951-0000   ° Behavioral   [address] °[place], [place] (336) 349-4454   °[facility] [address], [place], [place] (336) 342-8316 Insurance/Medicaid/sponsorship through [facility]  °[facility] [address]                                    [place], [place] (336) 342-8316 Therapy/tele-psych/case  °[facility] [address]  ° [place], [place] (336) 349-2233    °Dr. Arfeen  (336) 349-4544   °[facility] [place] [facility]  [employer] [facility] 1) [address], [place] °2) [address], [place] °3)  [address] [place] [address], [place] (336) 349-3220 °(336) 342-7768 ° °(336) 342-8140   °[place] [facility] (336) 342-1394 or (336) 342-3537 (After Hours)    ° ° ° °

## 2014-02-04 NOTE — ED Notes (Signed)
Patient c/o nausea and vomiting that started yesterday. Patient denies diarrhea. Patient also states blood sugars have been elevated. Patient A&OX4.

## 2017-03-10 ENCOUNTER — Other Ambulatory Visit: Payer: Self-pay

## 2017-03-10 ENCOUNTER — Emergency Department (HOSPITAL_COMMUNITY)
Admission: EM | Admit: 2017-03-10 | Discharge: 2017-03-10 | Disposition: A | Discharge status: Home / Self Care | Payer: BLUE CROSS/BLUE SHIELD | Attending: Emergency Medicine | Admitting: Emergency Medicine

## 2017-03-10 ENCOUNTER — Encounter (HOSPITAL_COMMUNITY): Payer: Self-pay | Admitting: Oncology

## 2017-03-10 DIAGNOSIS — Z9641 Presence of insulin pump (external) (internal): Secondary | ICD-10-CM | POA: Insufficient documentation

## 2017-03-10 DIAGNOSIS — Z79899 Other long term (current) drug therapy: Secondary | ICD-10-CM | POA: Diagnosis not present

## 2017-03-10 DIAGNOSIS — E162 Hypoglycemia, unspecified: Secondary | ICD-10-CM

## 2017-03-10 DIAGNOSIS — Z794 Long term (current) use of insulin: Secondary | ICD-10-CM | POA: Diagnosis not present

## 2017-03-10 DIAGNOSIS — F17200 Nicotine dependence, unspecified, uncomplicated: Secondary | ICD-10-CM | POA: Diagnosis not present

## 2017-03-10 DIAGNOSIS — E11649 Type 2 diabetes mellitus with hypoglycemia without coma: Secondary | ICD-10-CM | POA: Diagnosis not present

## 2017-03-10 DIAGNOSIS — Z7982 Long term (current) use of aspirin: Secondary | ICD-10-CM | POA: Insufficient documentation

## 2017-03-10 DIAGNOSIS — R739 Hyperglycemia, unspecified: Secondary | ICD-10-CM

## 2017-03-10 LAB — CBG MONITORING, ED
GLUCOSE-CAPILLARY: 537 mg/dL — AB (ref 65–99)
Glucose-Capillary: 426 mg/dL — ABNORMAL HIGH (ref 65–99)
Glucose-Capillary: >600 mg/dL (ref 65–99)

## 2017-03-10 LAB — I-STAT CHEM 8, ED
BUN: 10 mg/dL (ref 6–20)
CALCIUM ION: 1.16 mmol/L (ref 1.15–1.40)
Chloride: 97 mmol/L — ABNORMAL LOW (ref 101–111)
Creatinine, Ser: 0.9 mg/dL (ref 0.61–1.24)
GLUCOSE: 581 mg/dL — AB (ref 65–99)
HCT: 44 % (ref 39.0–52.0)
HEMOGLOBIN: 15 g/dL (ref 13.0–17.0)
POTASSIUM: 5.1 mmol/L (ref 3.5–5.1)
SODIUM: 134 mmol/L — AB (ref 135–145)
TCO2: 28 mmol/L (ref 22–32)

## 2017-03-10 MED ORDER — SODIUM CHLORIDE 0.9 % IV SOLN: 250.0000 mL | INTRAVENOUS | Status: DC | PRN

## 2017-03-10 MED ORDER — INSULIN ASPART 100 UNIT/ML ~~LOC~~ SOLN
8.0000 [IU] | Freq: Once | SUBCUTANEOUS | Status: AC
  Administered 2017-03-10: 8 [IU] via SUBCUTANEOUS
  Filled 2017-03-10: qty 1

## 2017-03-10 MED ORDER — SODIUM CHLORIDE 0.9% FLUSH: 3.0000 mL | INTRAVENOUS | Status: DC | PRN

## 2017-03-10 MED ORDER — SODIUM CHLORIDE 0.9% FLUSH: 3.0000 mL | Freq: Two times a day (BID) | INTRAVENOUS | Status: DC

## 2017-03-10 NOTE — ED Provider Notes (Signed)
[name] EMERGENCY DEPARTMENT Provider Note   CSN: [id number] Arrival date & time: 03/10/17  2003     History   Chief Complaint Chief Complaint  Patient presents with  . Hypoglycemia    HPI Danny Vargas is a 46 y.o. male.  HPI Patient presents to the emergency room for evaluation of hypoglycemia.  Patient was at home this evening.  He had an insulin pump.  Patient is not sure if he does give himself too much insulin.  He does remember having dinner that included a pork chop.  Family states that sometime later in the home he started becoming confused.  Felt like his blood sugar was low.  He tried drinking a [employer] and a Halloween candy.  His sugar dropped down into the 40s.  When EMS arrived they removed his insulin pump.  Patient states he feels well now.  He denies any trouble with fevers or chills.  No shortness of breath or abdominal pain.  No chest pain. Past Medical History:  Diagnosis Date  . Diabetes mellitus   . GERD (gastroesophageal reflux disease)     Patient Active Problem List   Diagnosis Date Noted  . Diabetes (HCC) 10/28/2013  . GERD (gastroesophageal reflux disease) 10/28/2013  . Nausea & vomiting 10/28/2013  . DKA (diabetic ketoacidoses) (HCC) 10/28/2013    Past Surgical History:  Procedure Laterality Date  . KNEE SURGERY         Home Medications    Prior to Admission medications   Medication Sig Start Date End Date Taking? Authorizing Provider  aspirin EC 81 MG tablet Take 81 mg by mouth every other day.    [provider]  insulin aspart (NOVOLOG) 100 UNIT/ML injection Inject into the skin 3 (three) times daily before meals. As directed per sliding scale instructions. Per patient: If levels are:  100-150= 2 units 151-200=3 units 201-250=4 units, and so forth as directed    [provider]  lisinopril (PRINIVIL,ZESTRIL) 5 MG tablet Take 5 mg by mouth daily.    [provider]  lovastatin  (MEVACOR) 20 MG tablet Take 20 mg by mouth daily.    [provider]  metoCLOPramide (REGLAN) 10 MG tablet Take 1 tablet (10 mg total) by mouth 3 (three) times daily before meals. 11/01/13   Mikhail, Nita Sells, DO  ondansetron (ZOFRAN ODT) 4 MG disintegrating tablet Take 1 tablet (4 mg total) by mouth every 8 (eight) hours as needed for nausea or vomiting. 02/04/14   Ward, Layla Maw, DO  ondansetron (ZOFRAN) 4 MG tablet Take 1 tablet (4 mg total) by mouth every 8 (eight) hours as needed for nausea or vomiting. 11/01/13   Edsel Petrin, DO    Family History No family history on file.  Social History Social History   Tobacco Use  . Smoking status: Current Every Day Smoker    Packs/day: 1.00  . Smokeless tobacco: Never Used  Substance Use Topics  . Alcohol use: No  . Drug use: No     Allergies   Peanut-containing drug products   Review of Systems Review of Systems  All other systems reviewed and are negative.    Physical Exam Updated Vital Signs BP 139/88   Pulse 100   Temp (!) 97.4 F (36.3 C) (Oral)   Resp 17   SpO2 96%   Physical Exam  Constitutional: He appears well-developed and well-nourished. No distress.  HENT:  Head: Normocephalic and atraumatic.  Right Ear: External ear normal.  Left Ear: External ear normal.  Eyes: Conjunctivae are normal. Right eye exhibits no discharge. Left eye exhibits no discharge. No scleral icterus.  Neck: Neck supple. No tracheal deviation present.  Cardiovascular: Normal rate, regular rhythm and intact distal pulses.  Pulmonary/Chest: Effort normal and breath sounds normal. No stridor. No respiratory distress. He has no wheezes. He has no rales.  Abdominal: Soft. Bowel sounds are normal. He exhibits no distension. There is no tenderness. There is no rebound and no guarding.  Musculoskeletal: He exhibits no edema or tenderness.  Neurological: He is alert. He has normal strength. No cranial nerve deficit (no facial droop,  extraocular movements intact, no slurred speech) or sensory deficit. He exhibits normal muscle tone. He displays no seizure activity. Coordination normal.  Skin: Skin is warm and dry. No rash noted.  Psychiatric: He has a normal mood and affect.  Nursing note and vitals reviewed.    ED Treatments / Results  Labs (all labs ordered are listed, but only abnormal results are displayed) Labs Reviewed  CBG MONITORING, ED - Abnormal; Notable for the following components:      Result Value   Glucose-Capillary 426 (*)    All other components within normal limits  CBG MONITORING, ED - Abnormal; Notable for the following components:   Glucose-Capillary >600 (*)    All other components within normal limits  I-STAT CHEM 8, ED - Abnormal; Notable for the following components:   Sodium 134 (*)    Chloride 97 (*)    Glucose, Bld 581 (*)    All other components within normal limits  CBG MONITORING, ED  CBG MONITORING, ED    EKG  EKG Interpretation  Date/Time:  Sunday March 10 2017 20:08:29 EST Ventricular Rate:  99 PR Interval:    QRS Duration: 91 QT Interval:  348 QTC Calculation: 447 R Axis:   81 Text Interpretation:  Sinus rhythm No old tracing to compare Confirmed by Linwood DibblesKnapp, Nalee Lightle [phone]([id number]) on 03/10/2017 8:30:37 PM       Radiology No results found.  Procedures Procedures (including critical care time)  Medications Ordered in ED Medications  sodium chloride flush (NS) 0.9 % injection 3 mL (3 mLs Intravenous Not Given 03/10/17 2102)  sodium chloride flush (NS) 0.9 % injection 3 mL (3 mLs Intravenous Not Given 03/10/17 2102)  0.9 %  sodium chloride infusion (250 mLs Intravenous Not Given 03/10/17 2102)  insulin aspart (novoLOG) injection 8 Units (not administered)     Initial Impression / Assessment and Plan / ED Course  I have reviewed the triage vital signs and the nursing notes.  Pertinent labs & imaging results that were available during my care of the patient were  reviewed by me and considered in my medical decision making (see chart for details).  Clinical Course as of Mar 10 2149  Wynelle LinkSun Mar 10, 2017  2148 Blood sugar has increased significantly.  Now in the 580s.  Pt has been eating and drinking.  Pt will now restart his insulin pump  [JK]    Clinical Course User Index [JK] Linwood DibblesKnapp, Renise Gillies, MD    Pt presented to the ED after hypoglycemic episode at home.  Over corrected and blood sugar into the 500s but no other symptoms or signs of DKA.  Pt will restart insulin pump.  Monitor his blood sugar closely and follow up with his pcp, endocrinologist.  Final Clinical Impressions(s) / ED Diagnoses   Final diagnoses:  Hypoglycemia  Hyperglycemia    ED Discharge Orders  None       Linwood DibblesKnapp, Chetara Kropp, MD 03/10/17 2150

## 2017-03-10 NOTE — ED Triage Notes (Signed)
Pt bib [facility] from home d/t hypoglycemia.  Per EMS,  Pt was pale and diaphoretic upon their arrival.  Pt drank a [place] and ate halloween candy, CBG remained 70.  Pt then drank more Mt. Dew, last CBG by EMS 270.  Pt wears an insulin pump that was changed in September and since that time pt has to remove insulin pump when his glucose gets low.  Pump is at bedside. Per EMS pt was showing NS on the monitor then began to display bigeminy at which time EMS placed pt on 2L O2 via Kay.  Pt is A&O x 4.  Denies CP pain/N/V.

## 2017-03-10 NOTE — ED Notes (Signed)
Dr Lynelle DoctorKnapp given a copy chem 8 results

## 2017-03-10 NOTE — ED Notes (Signed)
Family reports that pt had AMS prior to removing his insulin pump.  Pt is A&O x 4.

## 2017-03-10 NOTE — Discharge Instructions (Signed)
Your blood sugar closely.  Follow-up with your primary care doctor and endocrinologist.

## 2017-12-01 ENCOUNTER — Inpatient Hospital Stay (HOSPITAL_COMMUNITY)
Admission: EM | Admit: 2017-12-01 | Discharge: 2017-12-01 | DRG: 379 | Disposition: A | Discharge status: Home / Self Care | Payer: BLUE CROSS/BLUE SHIELD | Source: Other Acute Inpatient Hospital | Attending: Internal Medicine | Admitting: Internal Medicine

## 2017-12-01 ENCOUNTER — Encounter (HOSPITAL_COMMUNITY): Payer: Self-pay

## 2017-12-01 ENCOUNTER — Observation Stay (HOSPITAL_BASED_OUTPATIENT_CLINIC_OR_DEPARTMENT_OTHER)
Admission: AD | Admit: 2017-12-01 | Discharge: 2017-12-01 | Disposition: A | Discharge status: Home / Self Care | Payer: BLUE CROSS/BLUE SHIELD | Attending: Internal Medicine | Admitting: Internal Medicine

## 2017-12-01 ENCOUNTER — Other Ambulatory Visit: Payer: Self-pay

## 2017-12-01 DIAGNOSIS — E1065 Type 1 diabetes mellitus with hyperglycemia: Secondary | ICD-10-CM

## 2017-12-01 DIAGNOSIS — F17210 Nicotine dependence, cigarettes, uncomplicated: Secondary | ICD-10-CM | POA: Insufficient documentation

## 2017-12-01 DIAGNOSIS — E1110 Type 2 diabetes mellitus with ketoacidosis without coma: Secondary | ICD-10-CM | POA: Diagnosis present

## 2017-12-01 DIAGNOSIS — E1010 Type 1 diabetes mellitus with ketoacidosis without coma: Secondary | ICD-10-CM | POA: Diagnosis not present

## 2017-12-01 DIAGNOSIS — K219 Gastro-esophageal reflux disease without esophagitis: Secondary | ICD-10-CM | POA: Insufficient documentation

## 2017-12-01 DIAGNOSIS — D72829 Elevated white blood cell count, unspecified: Secondary | ICD-10-CM | POA: Insufficient documentation

## 2017-12-01 DIAGNOSIS — K920 Hematemesis: Secondary | ICD-10-CM | POA: Insufficient documentation

## 2017-12-01 DIAGNOSIS — Z794 Long term (current) use of insulin: Secondary | ICD-10-CM

## 2017-12-01 DIAGNOSIS — Z9641 Presence of insulin pump (external) (internal): Secondary | ICD-10-CM | POA: Insufficient documentation

## 2017-12-01 DIAGNOSIS — K922 Gastrointestinal hemorrhage, unspecified: Principal | ICD-10-CM | POA: Diagnosis present

## 2017-12-01 DIAGNOSIS — Z79899 Other long term (current) drug therapy: Secondary | ICD-10-CM | POA: Insufficient documentation

## 2017-12-01 DIAGNOSIS — K3184 Gastroparesis: Secondary | ICD-10-CM | POA: Insufficient documentation

## 2017-12-01 DIAGNOSIS — E1043 Type 1 diabetes mellitus with diabetic autonomic (poly)neuropathy: Secondary | ICD-10-CM | POA: Insufficient documentation

## 2017-12-01 DIAGNOSIS — Z7982 Long term (current) use of aspirin: Secondary | ICD-10-CM | POA: Insufficient documentation

## 2017-12-01 DIAGNOSIS — Z91010 Allergy to peanuts: Secondary | ICD-10-CM | POA: Insufficient documentation

## 2017-12-01 DIAGNOSIS — E1143 Type 2 diabetes mellitus with diabetic autonomic (poly)neuropathy: Secondary | ICD-10-CM | POA: Diagnosis present

## 2017-12-01 LAB — BASIC METABOLIC PANEL
ANION GAP: 9 (ref 5–15)
Anion gap: 16 — ABNORMAL HIGH (ref 5–15)
BUN: 20 mg/dL (ref 6–20)
BUN: 18 mg/dL (ref 6–20)
CALCIUM: 8.7 mg/dL — AB (ref 8.9–10.3)
CO2: 22 mmol/L (ref 22–32)
CO2: 26 mmol/L (ref 22–32)
CREATININE: 1.2 mg/dL (ref 0.61–1.24)
Calcium: 8.6 mg/dL — ABNORMAL LOW (ref 8.9–10.3)
Chloride: 105 mmol/L (ref 98–111)
Chloride: 111 mmol/L (ref 98–111)
Creatinine, Ser: 1.15 mg/dL (ref 0.61–1.24)
GFR calc Af Amer: >60 mL/min (ref >60)
GFR calc Af Amer: >60 mL/min (ref >60)
Glucose, Bld: 385 mg/dL — ABNORMAL HIGH (ref 70–99)
Glucose, Bld: 180 mg/dL — ABNORMAL HIGH (ref 70–99)
Potassium: 4.7 mmol/L (ref 3.5–5.1)
Potassium: 3.9 mmol/L (ref 3.5–5.1)
SODIUM: 146 mmol/L — AB (ref 135–145)
Sodium: 143 mmol/L (ref 135–145)

## 2017-12-01 LAB — GLUCOSE, CAPILLARY
GLUCOSE-CAPILLARY: 66 mg/dL — AB (ref 70–99)
GLUCOSE-CAPILLARY: 99 mg/dL (ref 70–99)
GLUCOSE-CAPILLARY: 306 mg/dL — AB (ref 70–99)
Glucose-Capillary: 371 mg/dL — ABNORMAL HIGH (ref 70–99)
Glucose-Capillary: 332 mg/dL — ABNORMAL HIGH (ref 70–99)
Glucose-Capillary: 169 mg/dL — ABNORMAL HIGH (ref 70–99)
Glucose-Capillary: 81 mg/dL (ref 70–99)

## 2017-12-01 LAB — HEMOGLOBIN A1C
Hgb A1c MFr Bld: 10.9 % — ABNORMAL HIGH (ref 4.8–5.6)
Mean Plasma Glucose: 266.13 mg/dL

## 2017-12-01 LAB — MRSA PCR SCREENING: MRSA BY PCR: NEGATIVE

## 2017-12-01 LAB — CBC
HCT: 39.5 % (ref 39.0–52.0)
Hemoglobin: 12.9 g/dL — ABNORMAL LOW (ref 13.0–17.0)
MCH: 26.8 pg (ref 26.0–34.0)
MCHC: 32.7 g/dL (ref 30.0–36.0)
MCV: 82 fL (ref 78.0–100.0)
Platelets: 308 10*3/uL (ref 150–400)
RBC: 4.82 MIL/uL (ref 4.22–5.81)
RDW: 15.1 % (ref 11.5–15.5)
WBC: 20.8 10*3/uL — AB (ref 4.0–10.5)

## 2017-12-01 LAB — HEMOGLOBIN AND HEMATOCRIT, BLOOD
HEMATOCRIT: 36.2 % — AB (ref 39.0–52.0)
Hemoglobin: 11.9 g/dL — ABNORMAL LOW (ref 13.0–17.0)

## 2017-12-01 LAB — TYPE AND SCREEN
ABO/RH(D): B POS
Antibody Screen: NEGATIVE

## 2017-12-01 LAB — HIV ANTIBODY (ROUTINE TESTING W REFLEX): HIV Screen 4th Generation wRfx: NONREACTIVE

## 2017-12-01 LAB — ABO/RH: ABO/RH(D): B POS

## 2017-12-01 MED ORDER — SODIUM CHLORIDE 0.9 % IV SOLN: 8.0000 mg/h | INTRAVENOUS | Status: DC

## 2017-12-01 MED ORDER — ALBUTEROL SULFATE (2.5 MG/3ML) 0.083% IN NEBU: 2.5000 mg | INHALATION_SOLUTION | Freq: Four times a day (QID) | RESPIRATORY_TRACT | Status: DC | PRN

## 2017-12-01 MED ORDER — SODIUM CHLORIDE 0.9 % IV SOLN: INTRAVENOUS | Status: DC

## 2017-12-01 MED ORDER — ONDANSETRON HCL 4 MG/2ML IJ SOLN
4.0000 mg | Freq: Four times a day (QID) | INTRAMUSCULAR | Status: DC | PRN
  Administered 2017-12-01: 4 mg via INTRAVENOUS
  Filled 2017-12-01: qty 2

## 2017-12-01 MED ORDER — SODIUM CHLORIDE 0.9 % IV SOLN
8.0000 mg/h | INTRAVENOUS | Status: DC
  Administered 2017-12-01: 8 mg/h via INTRAVENOUS
  Filled 2017-12-01 (×2): qty 80

## 2017-12-01 MED ORDER — SODIUM CHLORIDE 0.9% FLUSH: 3.0000 mL | Freq: Two times a day (BID) | INTRAVENOUS | Status: DC

## 2017-12-01 MED ORDER — ONDANSETRON HCL 4 MG PO TABS: 4.0000 mg | ORAL_TABLET | Freq: Four times a day (QID) | ORAL | Status: DC | PRN

## 2017-12-01 MED ORDER — ACETAMINOPHEN 325 MG PO TABS: 650.0000 mg | ORAL_TABLET | Freq: Four times a day (QID) | ORAL | Status: DC | PRN

## 2017-12-01 MED ORDER — SODIUM CHLORIDE 0.9 % IV BOLUS
1000.0000 mL | Freq: Once | INTRAVENOUS | Status: AC
  Administered 2017-12-01: 1000 mL via INTRAVENOUS

## 2017-12-01 MED ORDER — PANTOPRAZOLE SODIUM 40 MG IV SOLR: 40.0000 mg | Freq: Two times a day (BID) | INTRAVENOUS | Status: DC

## 2017-12-01 MED ORDER — INSULIN ASPART 100 UNIT/ML ~~LOC~~ SOLN
0.0000 [IU] | SUBCUTANEOUS | Status: DC
  Administered 2017-12-01: 11 [IU] via SUBCUTANEOUS
  Administered 2017-12-01: 3 [IU] via SUBCUTANEOUS

## 2017-12-01 MED ORDER — SODIUM CHLORIDE 0.9% FLUSH
3.0000 mL | Freq: Two times a day (BID) | INTRAVENOUS | Status: DC
  Administered 2017-12-01: 3 mL via INTRAVENOUS

## 2017-12-01 MED ORDER — INSULIN ASPART 100 UNIT/ML ~~LOC~~ SOLN
10.0000 [IU] | Freq: Once | SUBCUTANEOUS | Status: AC
  Administered 2017-12-01: 10 [IU] via SUBCUTANEOUS

## 2017-12-01 MED ORDER — ONDANSETRON HCL 4 MG/2ML IJ SOLN: 4.0000 mg | Freq: Four times a day (QID) | INTRAMUSCULAR | Status: DC | PRN

## 2017-12-01 MED ORDER — SODIUM CHLORIDE 0.9 % IV SOLN
INTRAVENOUS | Status: DC
  Administered 2017-12-01: 03:00:00 via INTRAVENOUS

## 2017-12-01 MED ORDER — ACETAMINOPHEN 650 MG RE SUPP: 650.0000 mg | Freq: Four times a day (QID) | RECTAL | Status: DC | PRN

## 2017-12-01 MED ORDER — INSULIN ASPART 100 UNIT/ML IV SOLN: 10.0000 [IU] | Freq: Once | INTRAVENOUS | Status: DC

## 2017-12-01 NOTE — Progress Notes (Signed)
0100, notified [facility] admission regarding patient's arrival. Receiving MD to place admission orders.

## 2017-12-01 NOTE — Consult Note (Signed)
Referring Provider:  Dr. Noralee Stain Primary Care Physician:  Teodoro Spray, MD Primary Gastroenterologist: None (unassigned) Reason for Consultation: Coffee-ground emesis  HPI: Danny Vargas is a 47 y.o. male with diabetes admitted to the hospital in transfer from [facility]) in the early hours of this morning in DKA, with also recurrent emesis, some of which had coffee-ground character but none of which was frankly bloody.    He has remained hemodynamically stable and his hemoglobin has remained acceptable, 11.9 this morning after IV hydration.  BUN has remained normal.  No history of melenic stools.  He attributes his nausea vomiting to that.  He is on low-dose aspirin (the patient does not have chronic GI tract symptomatology but recently, resumed the use  "weed" as well as consumption of red meat, prompting symptoms of nausea and vomiting about a week ago when he consumed them, and again recently prior to this admission.  81 mg every other day) but there is no history of prior ulcer disease or GI bleeding, nor any chronic dyspeptic symptomatology.    In the past, the patient is Nexium but has not used it for about a year since he has had remission of his upper tract symptoms by [facility] avoidance.   He says that he was endoscoped in [place] about a year ago, apparently with no significant findings uncovered. It was thereafter that he found he could avoid his symptoms by avoiding THC and red meat.   He also had colonoscopy around that time, again without any significant problems identified, by his report.  This morning, he is feeling well.   Past Medical History:  Diagnosis Date  . Diabetes mellitus   . GERD (gastroesophageal reflux disease)     Past Surgical History:  Procedure Laterality Date  . KNEE SURGERY      Prior to Admission medications   Medication Sig Start Date End Date Taking? Authorizing Provider  aspirin EC 81 MG tablet Take 81 mg by mouth  every other day.   Yes [provider]  insulin aspart (NOVOLOG) 100 UNIT/ML injection Inject 0.8 Units/hr into the skin See admin instructions. Use in insulin pump 0.6 units/hr plus boluses of 0-10 units for meals.   Yes [provider]  lisinopril (PRINIVIL,ZESTRIL) 5 MG tablet Take 5 mg by mouth daily.   Yes [provider]  lovastatin (MEVACOR) 20 MG tablet Take 20 mg by mouth daily.   Yes [provider]    Current Facility-Administered Medications  Medication Dose Route Frequency Provider Last Rate Last Dose  . acetaminophen (TYLENOL) tablet 650 mg  650 mg Oral Q6H PRN Clydie Braun, MD       Or  . acetaminophen (TYLENOL) suppository 650 mg  650 mg Rectal Q6H PRN Smith, Rondell A, MD      . albuterol (PROVENTIL) (2.5 MG/3ML) 0.083% nebulizer solution 2.5 mg  2.5 mg Nebulization Q6H PRN Smith, Rondell A, MD      . ondansetron (ZOFRAN) tablet 4 mg  4 mg Oral Q6H PRN Madelyn Flavors A, MD       Or  . ondansetron (ZOFRAN) injection 4 mg  4 mg Intravenous Q6H PRN Madelyn Flavors A, MD   4 mg at 12/01/17 0253  . sodium chloride flush (NS) 0.9 % injection 3 mL  3 mL Intravenous Q12H Smith, Rondell A, MD   3 mL at 12/01/17 0921    Allergies as of 11/30/2017 - Review Complete 03/10/2017  Allergen Reaction Noted  .  Peanut-containing drug products Itching 01/01/2012    No family history on file.  Social History   Socioeconomic History  . Marital status: Single    Spouse name: Not on file  . Number of children: Not on file  . Years of education: Not on file  . Highest education level: Not on file  Occupational History  . Not on file  Social Needs  . Financial resource strain: Not on file  . Food insecurity:    Worry: Not on file    Inability: Not on file  . Transportation needs:    Medical: Not on file    Non-medical: Not on file  Tobacco Use  . Smoking status: Current Every Day Smoker    Packs/day: 1.00  . Smokeless tobacco: Never Used   Substance and Sexual Activity  . Alcohol use: No  . Drug use: No  . Sexual activity: Yes  Lifestyle  . Physical activity:    Days per week: Not on file    Minutes per session: Not on file  . Stress: Not on file  Relationships  . Social connections:    Talks on phone: Not on file    Gets together: Not on file    Attends religious service: Not on file    Active member of club or organization: Not on file    Attends meetings of clubs or organizations: Not on file    Relationship status: Not on file  . Intimate partner violence:    Fear of current or ex partner: Not on file    Emotionally abused: Not on file    Physically abused: Not on file    Forced sexual activity: Not on file  Other Topics Concern  . Not on file  Social History Narrative  . Not on file    Review of Systems: No chest pain, breathing difficulties, significant joint problems (does get periodic right hand stiffness), or urinary difficulties.  He indicates that he was endoscoped  Physical Exam: Vital signs in last 24 hours: Temp:  [98.7 F (37.1 C)-99 F (37.2 C)] 98.7 F (37.1 C) (08/04 0800) Pulse Rate:  [102-113] 102 (08/04 0800) Resp:  [9-20] 16 (08/04 0800) BP: (91-136)/(43-61) 91/44 (08/04 0800) SpO2:  [95 %-98 %] 98 % (08/04 0800) Weight:  [68.2 kg (150 lb 5.7 oz)] 68.2 kg (150 lb 5.7 oz) (08/04 0230) Last BM Date: 11/29/17 This is a fairly thin but healthy-appearing, pleasant African-American male lying in bed in no distress whatsoever.  He is in good spirits.  The chest is clear, heart is normal, and the abdomen has no distention, organomegaly, guarding, mass-effect, or tenderness appreciated.  No evident focal neurologic deficits.  No peripheral edema.  No obvious skin rashes.  Skin is warm, radial pulse is full.  There is no scleral icterus, no evident pallor.  Intake/Output from previous day: 08/03 0701 - 08/04 0700 In: 1307.3 [I.V.:307.3; IV Piggyback:1000] Out: 575 [Urine:575] Intake/Output  this shift: No intake/output data recorded.  Lab Results: Recent Labs    12/01/17 0357 12/01/17 0805  WBC 20.8*  --   HGB 12.9* 11.9*  HCT 39.5 36.2*  PLT 308  --    BMET Recent Labs    12/01/17 0357 12/01/17 0805  NA 143 146*  K 4.7 3.9  CL 105 111  CO2 22 26  GLUCOSE 385* 180*  BUN 20 18  CREATININE 1.20 1.15  CALCIUM 8.7* 8.6*   LFT No results for input(s): PROT, ALBUMIN, AST, ALT, ALKPHOS, BILITOT,  BILIDIR, IBILI in the last 72 hours. PT/INR No results for input(s): LABPROT, INR in the last 72 hours.  Studies/Results: No results found.  Impression: 1.  Coffee-ground emesis without hemodynamic instability or evidence of clinically significant blood loss, probably due to "stress gastritis" from DKA, with perhaps an element of aspirin induced gastropathy.  Less likely would be a Mallory-Weiss tear.  Doubt bleeding from peptic ulcer disease although that would also be possible. 2.  Tendency for nausea and vomiting symptoms as a consequence of THC usage.  Plan: 1.  The patient was offered endoscopic evaluation to clarify the clinical picture.  However, he prefers to hold off, and I think that this is clinically acceptable in view of his symptomatic improvement and absence of evident clinically significant bleeding.  2.  I did recommend a week or 2 of PPI therapy (for example, OTC Prilosec or Nexium) to help heal whatever stomach irritation might be present.  I do not think ongoing therapy.  3.  The patient is up-to-date on colon cancer screening, based on the information he provided me, so I advised him that, in the absence of a family history of colon cancer, he can go 10 years between exams.  4.  I started the patient on clear liquid diet and, subsequent to that, the attending hospitalist has evaluated the patient, and plans for discharge are noted.  That is okay from the GI standpoint.   LOS: 0 days   Rollo Farquhar V  12/01/2017, 1:50 PM   Pager [phone] If  no answer or after 5 PM call [phone]

## 2017-12-01 NOTE — Progress Notes (Signed)
GI preliminary note (full dictated note to follow):  We are seeing the patient because [facility].  ([facility] by Dr. Adela LankArmbruster.)  I have seen and examined the patient.  He seems to be doing well from the GI tract standpoint.  He does not wish to have endoscopic evaluation.  I have started him on a clear liquid diet, and from my standpoint it is okay to advance as tolerated.  Please call me if any questions.  Florencia Reasonsobert V. Marelly Wehrman, M.D. Pager [phone] If no answer or after 5 PM call [phone]

## 2017-12-01 NOTE — Discharge Summary (Signed)
Physician Discharge Summary  Lynne LeaderScottie O President ONG:[id number] DOB: 30-May-1970 DOA: 12/01/2017  PCP: Teodoro Sprayrujillo, Jamie, MD  Admit date: 12/01/2017 Discharge date: 12/01/2017  Admitted From: Home Disposition:  Home  Recommendations for Outpatient Follow-up:  1. Follow up with PCP in 1 week 2. Please obtain BMP/CBC in 1 week   Discharge Condition: Stable CODE STATUS: Full  Diet recommendation: Advance as tolerated to carb modified diet   Brief/Interim Summary: HPI by Dr. Katrinka BlazingSmith: Collis Orland DecO Seelinger is a 47 y.o. male with medical history significant of diabetes mellitus type 1 on insulin pump, gastroparesis, and GERD; who presents with complaints of vomiting since yesterday morning around 3 AM.  Patient had just recently been hospitalized 1 week prior with similar symptoms.  He reports having several episodes of vomiting with emesis noted to be a coffee-ground appearance.  Unable to keep any liquids down.  Associated symptoms include abdominal discomfort from vomiting and urinary frequency.  Denies any dysuria, fever, chills, diarrhea, or melena. Patient reports that he had previously quit smoking, but started back here recently.  [facility] patient was noted to be afebrile with heart rates into the 110s and blood pressures maintained.  Initial labs revealed WBC 20.9, hemoglobin 15, INR 0.9, CO2 21, glucose 404, anion gap 28.  Chest x-ray showed hyperinflation of the lungs.  Urinalysis was positive for glucose and ketones.  Patient received 80 mg of Protonix IV, 2 L of normal saline IV fluids, 10 units of subcu insulin, and antiemetics including Haldol.   Due to the persistent nausea vomiting a nasogastric tube was placed. Dr. [facility] gastro[facility]ology was consulted to see the patient.  Patient was [facility] to [facility]. The nasogastric tube was discontinued prior to transport.  Interim: No further nausea, vomiting since admission. He is feeling well overall without any  complaints. He was evaluated by GI who had no further recommendations (patient did not wish for endoscopic evaluation) and advised clear liquid diet with advancement as tolerated. Patient was resumed on his insulin pump as his anion gap was closed. He wished to be discharged home as he was feeling much better. He did have an elevated WBC 20.8 without any evidence of infectious etiology; this could be from dehydration and hemoconcentration vs reactive etiology. He is recommended to follow up with PCP for repeat blood work this week and to return to the hospital if any symptoms recur, high fever, nausea, vomiting, blood loss in vomit or stool, abdominal pain.   Discharge Diagnoses:  Principal Problem:   Upper GI bleed Active Problems:   DKA (diabetic ketoacidoses) (HCC)   Gastroparesis due to DM (HCC)   Leukocytosis DM type 1    Discharge Instructions  Discharge Instructions    Call MD for:   Complete by:  As directed    Any blood loss in vomit or stool   Call MD for:  difficulty breathing, headache or visual disturbances   Complete by:  As directed    Call MD for:  extreme fatigue   Complete by:  As directed    Call MD for:  hives   Complete by:  As directed    Call MD for:  persistant dizziness or light-headedness   Complete by:  As directed    Call MD for:  persistant nausea and vomiting   Complete by:  As directed    Call MD for:  severe uncontrolled pain   Complete by:  As directed  Call MD for:  temperature >100.4   Complete by:  As directed    Diet Carb Modified   Complete by:  As directed    Discharge instructions   Complete by:  As directed    You were cared for by a hospitalist during your hospital stay. If you have any questions about your discharge medications or the care you received while you were in the hospital after you are discharged, you can call the unit and ask to speak with the hospitalist on call if the hospitalist that took care of you is not available.  Once you are discharged, your primary care physician will handle any further medical issues. Please note that NO REFILLS for any discharge medications will be authorized once you are discharged, as it is imperative that you return to your primary care physician (or establish a relationship with a primary care physician if you do not have one) for your aftercare needs so that they can reassess your need for medications and monitor your lab values.   Increase activity slowly   Complete by:  As directed      Allergies as of 12/01/2017      Reactions   Peanut-containing Drug Products Itching, Rash   Not all peanuts trigger the allergic reaction of itching in the throat and ears      Medication List    STOP taking these medications   aspirin EC 81 MG tablet     TAKE these medications   lisinopril 5 MG tablet Commonly known as:  PRINIVIL,ZESTRIL Take 5 mg by mouth daily.   lovastatin 20 MG tablet Commonly known as:  MEVACOR Take 20 mg by mouth daily.   NOVOLOG 100 UNIT/ML injection Generic drug:  insulin aspart Inject 0.8 Units/hr into the skin See admin instructions. Use in insulin pump 0.6 units/hr plus boluses of 0-10 units for meals.      Follow-up Information    Teodoro Spray, MD. Schedule an appointment as soon as possible for a visit in 1 week(s).   Specialty:  Internal Medicine Contact information: [address] [place] [address] [phone]          Allergies  Allergen Reactions  . Peanut-Containing Drug Products Itching and Rash    Not all peanuts trigger the allergic reaction of itching in the throat and ears    Consultations:  GI   Procedures/Studies: No results found.     Discharge Exam: Vitals:   12/01/17 0700 12/01/17 0800  BP: (!) 99/46 (!) 91/44  Pulse: (!) 102 (!) 102  Resp: (!) 9 16  Temp:  98.7 F (37.1 C)  SpO2: 96% 98%    General: Pt is alert, awake, not in acute distress Cardiovascular: RRR, S1/S2 +, no rubs, no  gallops Respiratory: CTA bilaterally, no wheezing, no rhonchi Abdominal: Soft, NT, ND, bowel sounds + Extremities: no edema, no cyanosis    The results of significant diagnostics from this hospitalization (including imaging, microbiology, ancillary and laboratory) are listed below for reference.     Microbiology: Recent Results (from the past 240 hour(s))  MRSA PCR Screening     Status: None   Collection Time: 12/01/17  2:34 AM  Result Value Ref Range Status   MRSA by PCR NEGATIVE NEGATIVE Final    Comment:        The GeneXpert MRSA Assay (FDA approved for NASAL specimens only), is one component of a comprehensive MRSA colonization surveillance program. It is not intended to diagnose MRSA infection  nor to guide or monitor treatment for MRSA infections. Performed at [facility], [address] [place], [place] [address]      Labs: BNP (last 3 results) No results for input(s): BNP in the last 8760 hours. Basic Metabolic Panel: Recent Labs  Lab 12/01/17 0357 12/01/17 0805  NA 143 146*  K 4.7 3.9  CL 105 111  CO2 22 26  GLUCOSE 385* 180*  BUN 20 18  CREATININE 1.20 1.15  CALCIUM 8.7* 8.6*   Liver Function Tests: No results for input(s): AST, ALT, ALKPHOS, BILITOT, PROT, ALBUMIN in the last 168 hours. No results for input(s): LIPASE, AMYLASE in the last 168 hours. No results for input(s): AMMONIA in the last 168 hours. CBC: Recent Labs  Lab 12/01/17 0357 12/01/17 0805  WBC 20.8*  --   HGB 12.9* 11.9*  HCT 39.5 36.2*  MCV 82.0  --   PLT 308  --    Cardiac Enzymes: No results for input(s): CKTOTAL, CKMB, CKMBINDEX, TROPONINI in the last 168 hours. BNP: Invalid input(s): POCBNP CBG: Recent Labs  Lab 12/01/17 0456 12/01/17 0751 12/01/17 1022 12/01/17 1135 12/01/17 1159  GLUCAP 332* 169* 81 66* 99   D-Dimer No results for input(s): DDIMER in the last 72 hours. Hgb A1c Recent Labs    12/01/17 0357  HGBA1C 10.9*   Lipid  Profile No results for input(s): CHOL, HDL, LDLCALC, TRIG, CHOLHDL, LDLDIRECT in the last 72 hours. Thyroid function studies No results for input(s): TSH, T4TOTAL, T3FREE, THYROIDAB in the last 72 hours.  Invalid input(s): FREET3 Anemia work up No results for input(s): VITAMINB12, FOLATE, FERRITIN, TIBC, IRON, RETICCTPCT in the last 72 hours. Urinalysis    Component Value Date/Time   COLORURINE YELLOW 02/04/2014 0125   APPEARANCEUR CLEAR 02/04/2014 0125   LABSPEC 1.010 02/04/2014 0125   PHURINE 7.5 02/04/2014 0125   GLUCOSEU >1000 (A) 02/04/2014 0125   HGBUR NEGATIVE 02/04/2014 0125   BILIRUBINUR NEGATIVE 02/04/2014 0125   KETONESUR 40 (A) 02/04/2014 0125   PROTEINUR NEGATIVE 02/04/2014 0125   UROBILINOGEN 0.2 02/04/2014 0125   NITRITE NEGATIVE 02/04/2014 0125   LEUKOCYTESUR NEGATIVE 02/04/2014 0125   Sepsis Labs Invalid input(s): PROCALCITONIN,  WBC,  LACTICIDVEN Microbiology Recent Results (from the past 240 hour(s))  MRSA PCR Screening     Status: None   Collection Time: 12/01/17  2:34 AM  Result Value Ref Range Status   MRSA by PCR NEGATIVE NEGATIVE Final    Comment:        The GeneXpert MRSA Assay (FDA approved for NASAL specimens only), is one component of a comprehensive MRSA colonization surveillance program. It is not intended to diagnose MRSA infection nor to guide or monitor treatment for MRSA infections. Performed at [facility], [address] [place], [place] [address]      Patient was seen and examined on the day of discharge and was found to be in stable condition. Time coordinating discharge: 35 minutes including assessment and coordination of care, as well as examination of the patient.   SIGNED:  Noralee Stain, DO [facility] Pager [phone]  If 7PM-7AM, please contact night-coverage [email or web address] Password [email or web address] 12/01/2017, 1:29 PM

## 2017-12-01 NOTE — H&P (Signed)
History and Physical    Danny Vargas ZOX:[id number] DOB: 10/31/1970 DOA: (Not on file)  Referring MD/NP/PA: Lana Fish, MD PCP: Teodoro Spray, MD  Patient coming from: [facility] transfer  Chief Complaint: Vomiting  I have personally briefly reviewed patient's old medical records in [facility]   HPI: Danny Vargas is a 47 y.o. male with medical history significant of diabetes mellitus type 1 on insulin pump, gastroparesis, and GERD; who presents with complaints of vomiting since yesterday morning around 3 AM.  Patient had just recently been hospitalized 1 week prior with similar symptoms.  He reports having several episodes of vomiting with emesis noted to be a coffee-ground appearance.  Unable to keep any liquids down.  Associated symptoms include abdominal discomfort from vomiting and urinary frequency.  Denies any dysuria, fever, chills, diarrhea, or melena. Patient reports that he had previously quit smoking, but started back here recently.  On admission to the [facility] [place] patient was noted to be afebrile with heart rates into the 110s and blood pressures maintained.  Initial labs revealed WBC 20.9, hemoglobin 15, INR 0.9, CO2 21, glucose 404, anion gap 28.  Chest x-ray showed hyperinflation of the lungs.  Urinalysis was positive for glucose and ketones.  Patient received 80 mg of Protonix IV, 2 L of normal saline IV fluids, 10 units of subcu insulin, and antiemetics including Haldol.   Due to the persistent nausea vomiting a nasogastric tube was placed. Dr. Adela Lank gastroenterology was consulted to see the patient.  Patient was accepted to a stepdown bed here at [facility]. The nasogastric tube was discontinued prior to transport.  ED Course: As seen above  Review of Systems  Constitutional: Positive for malaise/fatigue. Negative for chills and fever.  HENT: Negative for ear discharge and nosebleeds.   Eyes: Negative for double vision and photophobia.    Respiratory: Negative for cough and shortness of breath.   Cardiovascular: Negative for chest pain and leg swelling.  Gastrointestinal: Positive for abdominal pain, nausea and vomiting.  Genitourinary: Positive for frequency. Negative for dysuria.  Musculoskeletal: Negative for falls.  Skin: Negative for rash.  Neurological: Positive for weakness. Negative for loss of consciousness.  Endo/Heme/Allergies: Does not bruise/bleed easily.  Psychiatric/Behavioral: Positive for substance abuse. Negative for memory loss.    Past Medical History:  Diagnosis Date  . Diabetes mellitus   . GERD (gastroesophageal reflux disease)     Past Surgical History:  Procedure Laterality Date  . KNEE SURGERY       reports that he has been smoking.  He has been smoking about 1.00 pack per day. He has never used smokeless tobacco. He reports that he does not drink alcohol or use drugs.  Allergies  Allergen Reactions  . Peanut-Containing Drug Products Itching    Not all peanuts trigger the allergic reaction of itching in the throat and ears    No family history on file.  Prior to Admission medications   Medication Sig Start Date End Date Taking? Authorizing Provider  aspirin EC 81 MG tablet Take 81 mg by mouth every other day.    [provider]  insulin aspart (NOVOLOG) 100 UNIT/ML injection Inject into the skin 3 (three) times daily before meals. As directed per sliding scale instructions. Per patient: If levels are:  100-150= 2 units 151-200=3 units 201-250=4 units, and so forth as directed    [provider]  lisinopril (PRINIVIL,ZESTRIL) 5 MG tablet Take 5 mg by mouth daily.    [provider]  lovastatin (MEVACOR) 20 MG tablet Take 20 mg by mouth daily.    [provider]  metoCLOPramide (REGLAN) 10 MG tablet Take 1 tablet (10 mg total) by mouth 3 (three) times daily before meals. 11/01/13   Mikhail, Nita SellsMaryann, DO  ondansetron (ZOFRAN ODT) 4 MG disintegrating  tablet Take 1 tablet (4 mg total) by mouth every 8 (eight) hours as needed for nausea or vomiting. 02/04/14   Ward, Layla MawKristen N, DO  ondansetron (ZOFRAN) 4 MG tablet Take 1 tablet (4 mg total) by mouth every 8 (eight) hours as needed for nausea or vomiting. 11/01/13   Edsel PetrinMikhail, Maryann, DO    Physical Exam:  Constitutional: Middle-aged male who appears to be in some discomfort but able to follow commands. Vitals:   12/01/17 0230 12/01/17 0234  BP: (!) 104/59   Pulse:  (!) 113  Resp:  18  Temp: 98.7 F (37.1 C)   SpO2:  96%  Weight: 68.2 kg (150 lb 5.7 oz)   Height: 6' (1.829 m)    Eyes: PERRL, lids and conjunctivae normal ENMT: Mucous membranes are dry. Posterior pharynx clear of any exudate or lesions.   Neck: normal, supple, no masses, no thyromegaly Respiratory: clear to auscultation bilaterally, no wheezing, no crackles. Normal respiratory effort. No accessory muscle use.  Cardiovascular: Tachycardic, no murmurs / rubs / gallops. No extremity edema. 2+ pedal pulses. No carotid bruits.  Abdomen: Mild epigastric tenderness, no masses palpated. No hepatosplenomegaly. Bowel sounds positive.  Musculoskeletal: no clubbing / cyanosis.  Previous amputation of the right toe extremities. Good ROM, no contractures. Normal muscle tone.  Skin: no rashes, lesions, ulcers. No induration Neurologic: CN 2-12 grossly intact. Sensation intact, DTR normal. Strength 5/5 in all 4.  Psychiatric: Normal judgment and insight. Alert and oriented x 3. Normal mood.     Labs on Admission: I have personally reviewed following labs and imaging studies  CBC: No results for input(s): WBC, NEUTROABS, HGB, HCT, MCV, PLT in the last 168 hours. Basic Metabolic Panel: No results for input(s): NA, K, CL, CO2, GLUCOSE, BUN, CREATININE, CALCIUM, MG, PHOS in the last 168 hours. GFR: CrCl cannot be calculated (Patient's most recent lab result is older than the maximum 21 days allowed.). Liver Function Tests: No results  for input(s): AST, ALT, ALKPHOS, BILITOT, PROT, ALBUMIN in the last 168 hours. No results for input(s): LIPASE, AMYLASE in the last 168 hours. No results for input(s): AMMONIA in the last 168 hours. Coagulation Profile: No results for input(s): INR, PROTIME in the last 168 hours. Cardiac Enzymes: No results for input(s): CKTOTAL, CKMB, CKMBINDEX, TROPONINI in the last 168 hours. BNP (last 3 results) No results for input(s): PROBNP in the last 8760 hours. HbA1C: No results for input(s): HGBA1C in the last 72 hours. CBG: No results for input(s): GLUCAP in the last 168 hours. Lipid Profile: No results for input(s): CHOL, HDL, LDLCALC, TRIG, CHOLHDL, LDLDIRECT in the last 72 hours. Thyroid Function Tests: No results for input(s): TSH, T4TOTAL, FREET4, T3FREE, THYROIDAB in the last 72 hours. Anemia Panel: No results for input(s): VITAMINB12, FOLATE, FERRITIN, TIBC, IRON, RETICCTPCT in the last 72 hours. Urine analysis:    Component Value Date/Time   COLORURINE YELLOW 02/04/2014 0125   APPEARANCEUR CLEAR 02/04/2014 0125   LABSPEC 1.010 02/04/2014 0125   PHURINE 7.5 02/04/2014 0125   GLUCOSEU >1000 (A) 02/04/2014 0125   HGBUR NEGATIVE 02/04/2014 0125   BILIRUBINUR NEGATIVE 02/04/2014 0125   KETONESUR 40 (A) 02/04/2014 0125   PROTEINUR  NEGATIVE 02/04/2014 0125   UROBILINOGEN 0.2 02/04/2014 0125   NITRITE NEGATIVE 02/04/2014 0125   LEUKOCYTESUR NEGATIVE 02/04/2014 0125   Sepsis Labs: No results found for this or any previous visit (from the past 240 hour(s)).   Radiological Exams on Admission: No results found.  EKG: Independently reviewed.  Sinus tachycardia at 113 bpm  Assessment/Plan Suspected upper GI bleed: Acute.  Patient presents with hematemesis from the outside hospital.  Hemoglobin was noted to be 15.1.  Patient has not vomited since arrival. - Admit to a stepdown bed - N.p.o. - Type and screen for possible need of blood products - Protonix drip - IV fluids normal  saline at 75 mL/h - Antiemetics as needed - Check CBC stat  - Check UDS - Transfuse blood products as needed - Dr. Adela Lank of [facility] was consulted    Diabetic ketoacidosis, in type 1 with history of  gastroparesis: Acute.  Patient reports poorly controlled diabetes mellitus type 1 with last hemoglobin A1c noted to be 10.7.  Initially found to be in DKA with blood glucose of 404 with CO2 21, and anion gap 28. - Hypoglycemic protocol - Check hemoglobin A1c in a.m. - CBGs every 4 hours with moderate SSI - Check BMP and may need to transition patient to a insulin drip - Patient insulin pump currently off.  Leukocytosis: Acute.  Initial WBC elevated 20.9 on admission.  No clear signs of infection noted.   - Recheck CBC - Monitoring off antibiotics at this time   DVT prophylaxis: SCDs Code Status: Full Family Communication: No family present at bedside Disposition Plan: Likely discharge home once medically stable Consults called: None Admission status: Inpatient  Clydie Braun MD [facility] Pager [phone]   If 7PM-7AM, please contact night-coverage [email or web address] Password [email or web address]  12/01/2017, 1:49 AM

## 2017-12-02 LAB — GLUCOSE, CAPILLARY: Glucose-Capillary: 395 mg/dL — ABNORMAL HIGH (ref 70–99)

## 2017-12-04 MED FILL — Fentanyl Citrate Preservative Free (PF) Inj 100 MCG/2ML: INTRAMUSCULAR | Qty: 2 | Status: AC

## 2017-12-04 MED FILL — Ondansetron HCl Inj 4 MG/2ML (2 MG/ML): INTRAMUSCULAR | Qty: 2 | Status: AC

## 2017-12-14 ENCOUNTER — Inpatient Hospital Stay (HOSPITAL_COMMUNITY)
Admission: AD | Admit: 2017-12-14 | Discharge: 2017-12-17 | DRG: 073 | Disposition: A | Discharge status: Home / Self Care | Payer: BLUE CROSS/BLUE SHIELD | Source: Other Acute Inpatient Hospital | Attending: Internal Medicine | Admitting: Internal Medicine

## 2017-12-14 ENCOUNTER — Encounter (HOSPITAL_COMMUNITY): Payer: Self-pay

## 2017-12-14 DIAGNOSIS — R112 Nausea with vomiting, unspecified: Secondary | ICD-10-CM | POA: Diagnosis not present

## 2017-12-14 DIAGNOSIS — E785 Hyperlipidemia, unspecified: Secondary | ICD-10-CM | POA: Diagnosis present

## 2017-12-14 DIAGNOSIS — E1010 Type 1 diabetes mellitus with ketoacidosis without coma: Secondary | ICD-10-CM

## 2017-12-14 DIAGNOSIS — E119 Type 2 diabetes mellitus without complications: Secondary | ICD-10-CM

## 2017-12-14 DIAGNOSIS — R402140 Coma scale, eyes open, spontaneous, unspecified time: Secondary | ICD-10-CM | POA: Diagnosis present

## 2017-12-14 DIAGNOSIS — K2211 Ulcer of esophagus with bleeding: Secondary | ICD-10-CM | POA: Diagnosis present

## 2017-12-14 DIAGNOSIS — N181 Chronic kidney disease, stage 1: Secondary | ICD-10-CM | POA: Diagnosis present

## 2017-12-14 DIAGNOSIS — Z79899 Other long term (current) drug therapy: Secondary | ICD-10-CM | POA: Diagnosis not present

## 2017-12-14 DIAGNOSIS — E1022 Type 1 diabetes mellitus with diabetic chronic kidney disease: Secondary | ICD-10-CM | POA: Diagnosis present

## 2017-12-14 DIAGNOSIS — Z9641 Presence of insulin pump (external) (internal): Secondary | ICD-10-CM | POA: Diagnosis present

## 2017-12-14 DIAGNOSIS — F1210 Cannabis abuse, uncomplicated: Secondary | ICD-10-CM | POA: Diagnosis present

## 2017-12-14 DIAGNOSIS — K2210 Ulcer of esophagus without bleeding: Secondary | ICD-10-CM

## 2017-12-14 DIAGNOSIS — K920 Hematemesis: Secondary | ICD-10-CM | POA: Diagnosis not present

## 2017-12-14 DIAGNOSIS — E1143 Type 2 diabetes mellitus with diabetic autonomic (poly)neuropathy: Secondary | ICD-10-CM | POA: Diagnosis present

## 2017-12-14 DIAGNOSIS — E1065 Type 1 diabetes mellitus with hyperglycemia: Secondary | ICD-10-CM | POA: Diagnosis present

## 2017-12-14 DIAGNOSIS — F17200 Nicotine dependence, unspecified, uncomplicated: Secondary | ICD-10-CM | POA: Diagnosis present

## 2017-12-14 DIAGNOSIS — I10 Essential (primary) hypertension: Secondary | ICD-10-CM | POA: Diagnosis present

## 2017-12-14 DIAGNOSIS — K3184 Gastroparesis: Secondary | ICD-10-CM | POA: Diagnosis present

## 2017-12-14 DIAGNOSIS — R402360 Coma scale, best motor response, obeys commands, unspecified time: Secondary | ICD-10-CM | POA: Diagnosis present

## 2017-12-14 DIAGNOSIS — Z888 Allergy status to other drugs, medicaments and biological substances status: Secondary | ICD-10-CM

## 2017-12-14 DIAGNOSIS — I129 Hypertensive chronic kidney disease with stage 1 through stage 4 chronic kidney disease, or unspecified chronic kidney disease: Secondary | ICD-10-CM | POA: Diagnosis present

## 2017-12-14 DIAGNOSIS — K209 Esophagitis, unspecified without bleeding: Secondary | ICD-10-CM

## 2017-12-14 DIAGNOSIS — K210 Gastro-esophageal reflux disease with esophagitis: Secondary | ICD-10-CM | POA: Diagnosis present

## 2017-12-14 DIAGNOSIS — R402250 Coma scale, best verbal response, oriented, unspecified time: Secondary | ICD-10-CM | POA: Diagnosis present

## 2017-12-14 DIAGNOSIS — D72829 Elevated white blood cell count, unspecified: Secondary | ICD-10-CM | POA: Diagnosis present

## 2017-12-14 DIAGNOSIS — E1043 Type 1 diabetes mellitus with diabetic autonomic (poly)neuropathy: Secondary | ICD-10-CM | POA: Diagnosis present

## 2017-12-14 HISTORY — DX: Essential (primary) hypertension: I10

## 2017-12-14 HISTORY — DX: Cannabis abuse, uncomplicated: F12.10

## 2017-12-14 HISTORY — DX: Hyperlipidemia, unspecified: E78.5

## 2017-12-14 LAB — COMPREHENSIVE METABOLIC PANEL
ALBUMIN: 3.4 g/dL — AB (ref 3.5–5.0)
ALT: 20 U/L (ref 0–44)
ANION GAP: 18 — AB (ref 5–15)
AST: 20 U/L (ref 15–41)
Alkaline Phosphatase: 124 U/L (ref 38–126)
BUN: 13 mg/dL (ref 6–20)
CO2: 18 mmol/L — AB (ref 22–32)
Calcium: 8.8 mg/dL — ABNORMAL LOW (ref 8.9–10.3)
Chloride: 109 mmol/L (ref 98–111)
Creatinine, Ser: 1.37 mg/dL — ABNORMAL HIGH (ref 0.61–1.24)
GFR calc non Af Amer: >60 mL/min (ref >60)
GLUCOSE: 342 mg/dL — AB (ref 70–99)
POTASSIUM: 4 mmol/L (ref 3.5–5.1)
SODIUM: 145 mmol/L (ref 135–145)
TOTAL PROTEIN: 6.5 g/dL (ref 6.5–8.1)
Total Bilirubin: 1.2 mg/dL (ref 0.3–1.2)

## 2017-12-14 LAB — GLUCOSE, CAPILLARY
GLUCOSE-CAPILLARY: 342 mg/dL — AB (ref 70–99)
GLUCOSE-CAPILLARY: 269 mg/dL — AB (ref 70–99)
GLUCOSE-CAPILLARY: 65 mg/dL — AB (ref 70–99)
GLUCOSE-CAPILLARY: 174 mg/dL — AB (ref 70–99)
Glucose-Capillary: 190 mg/dL — ABNORMAL HIGH (ref 70–99)
Glucose-Capillary: 126 mg/dL — ABNORMAL HIGH (ref 70–99)
Glucose-Capillary: 73 mg/dL (ref 70–99)

## 2017-12-14 LAB — CBC
HCT: 41.1 % (ref 39.0–52.0)
Hemoglobin: 12.8 g/dL — ABNORMAL LOW (ref 13.0–17.0)
MCH: 26.6 pg (ref 26.0–34.0)
MCHC: 31.1 g/dL (ref 30.0–36.0)
MCV: 85.4 fL (ref 78.0–100.0)
Platelets: 409 10*3/uL — ABNORMAL HIGH (ref 150–400)
RBC: 4.81 MIL/uL (ref 4.22–5.81)
RDW: 15.9 % — AB (ref 11.5–15.5)
WBC: 17.1 10*3/uL — ABNORMAL HIGH (ref 4.0–10.5)

## 2017-12-14 LAB — TYPE AND SCREEN
ABO/RH(D): B POS
ANTIBODY SCREEN: NEGATIVE

## 2017-12-14 LAB — ABO/RH: ABO/RH(D): B POS

## 2017-12-14 MED ORDER — TRAMADOL HCL 50 MG PO TABS
50.0000 mg | ORAL_TABLET | Freq: Four times a day (QID) | ORAL | Status: DC | PRN
  Administered 2017-12-14 – 2017-12-15 (×3): 50 mg via ORAL
  Filled 2017-12-14 (×3): qty 1

## 2017-12-14 MED ORDER — LISINOPRIL 5 MG PO TABS
5.0000 mg | ORAL_TABLET | Freq: Every day | ORAL | Status: DC
  Administered 2017-12-14 – 2017-12-17 (×4): 5 mg via ORAL
  Filled 2017-12-14 (×4): qty 1

## 2017-12-14 MED ORDER — INSULIN ASPART 100 UNIT/ML ~~LOC~~ SOLN
0.0000 [IU] | SUBCUTANEOUS | Status: DC
  Administered 2017-12-14: 11 [IU] via SUBCUTANEOUS

## 2017-12-14 MED ORDER — ACETAMINOPHEN 325 MG PO TABS: 650.0000 mg | ORAL_TABLET | Freq: Four times a day (QID) | ORAL | Status: DC | PRN

## 2017-12-14 MED ORDER — PANTOPRAZOLE SODIUM 40 MG IV SOLR
40.0000 mg | Freq: Two times a day (BID) | INTRAVENOUS | Status: DC
  Administered 2017-12-14 – 2017-12-17 (×6): 40 mg via INTRAVENOUS
  Filled 2017-12-14 (×7): qty 40

## 2017-12-14 MED ORDER — ENOXAPARIN SODIUM 40 MG/0.4ML ~~LOC~~ SOLN
40.0000 mg | SUBCUTANEOUS | Status: DC
  Administered 2017-12-14 – 2017-12-17 (×4): 40 mg via SUBCUTANEOUS
  Filled 2017-12-14 (×4): qty 0.4

## 2017-12-14 MED ORDER — SODIUM CHLORIDE 0.9 % IV SOLN
INTRAVENOUS | Status: AC
  Administered 2017-12-14: 06:00:00 via INTRAVENOUS

## 2017-12-14 MED ORDER — PANTOPRAZOLE SODIUM 40 MG IV SOLR: 40.0000 mg | Freq: Two times a day (BID) | INTRAVENOUS | Status: DC

## 2017-12-14 MED ORDER — INSULIN DETEMIR 100 UNIT/ML ~~LOC~~ SOLN
10.0000 [IU] | Freq: Two times a day (BID) | SUBCUTANEOUS | Status: DC
  Administered 2017-12-14 – 2017-12-17 (×5): 10 [IU] via SUBCUTANEOUS
  Filled 2017-12-14 (×8): qty 0.1

## 2017-12-14 MED ORDER — POLYETHYLENE GLYCOL 3350 17 G PO PACK
17.0000 g | PACK | Freq: Every day | ORAL | Status: DC | PRN
  Administered 2017-12-17: 17 g via ORAL
  Filled 2017-12-14: qty 1

## 2017-12-14 MED ORDER — METOCLOPRAMIDE HCL 5 MG/ML IJ SOLN
10.0000 mg | Freq: Three times a day (TID) | INTRAMUSCULAR | Status: DC
  Administered 2017-12-14 – 2017-12-17 (×11): 10 mg via INTRAVENOUS
  Filled 2017-12-14 (×11): qty 2

## 2017-12-14 MED ORDER — PROMETHAZINE HCL 25 MG/ML IJ SOLN
12.5000 mg | Freq: Four times a day (QID) | INTRAMUSCULAR | Status: DC | PRN
  Administered 2017-12-14 – 2017-12-17 (×7): 12.5 mg via INTRAVENOUS
  Filled 2017-12-14 (×7): qty 1

## 2017-12-14 MED ORDER — INSULIN ASPART 100 UNIT/ML ~~LOC~~ SOLN
0.0000 [IU] | Freq: Three times a day (TID) | SUBCUTANEOUS | Status: DC
  Administered 2017-12-14: 8 [IU] via SUBCUTANEOUS
  Administered 2017-12-14: 3 [IU] via SUBCUTANEOUS
  Administered 2017-12-15: 5 [IU] via SUBCUTANEOUS
  Administered 2017-12-15: 3 [IU] via SUBCUTANEOUS
  Administered 2017-12-15: 8 [IU] via SUBCUTANEOUS
  Administered 2017-12-17: 5 [IU] via SUBCUTANEOUS

## 2017-12-14 MED ORDER — ENSURE ENLIVE PO LIQD
237.0000 mL | Freq: Two times a day (BID) | ORAL | Status: DC
  Administered 2017-12-14 (×2): 237 mL via ORAL

## 2017-12-14 MED ORDER — PRAVASTATIN SODIUM 20 MG PO TABS
20.0000 mg | ORAL_TABLET | Freq: Every day | ORAL | Status: DC
  Administered 2017-12-14 – 2017-12-16 (×3): 20 mg via ORAL
  Filled 2017-12-14 (×3): qty 1

## 2017-12-14 MED ORDER — ACETAMINOPHEN 650 MG RE SUPP: 650.0000 mg | Freq: Four times a day (QID) | RECTAL | Status: DC | PRN

## 2017-12-14 MED ORDER — SODIUM CHLORIDE 0.9 % IV SOLN
8.0000 mg/h | INTRAVENOUS | Status: DC
  Administered 2017-12-14: 8 mg/h via INTRAVENOUS
  Filled 2017-12-14: qty 80

## 2017-12-14 NOTE — Progress Notes (Signed)
Inpatient Diabetes Program Recommendations  AACE/ADA: New Consensus Statement on Inpatient Glycemic Control (2015)  Target Ranges:  Prepandial:   less than 140 mg/dL      Peak postprandial:   less than 180 mg/dL (1-2 hours)      Critically ill patients:  140 - 180 mg/dL   Lab Results  Component Value Date   GLUCAP 190 (H) 12/14/2017   HGBA1C 10.9 (H) 12/01/2017    Review of Glycemic ControlResults for Lynne LeaderKING, Jayten O (MRN [id number]) as of 12/14/2017 15:09  Ref. Range 12/14/2017 06:05 12/14/2017 08:10 12/14/2017 14:19  Glucose-Capillary Latest Ref Range: 70 - 99 mg/dL 409342 (H) 811269 (H) 914190 (H)    Diabetes history: Type 1 DM Outpatient Diabetes medications: Insulin pump-[employer] Settings per last visit with MD: Cont basal rates: Total basal=24.45 units/daily 0:00 0.85 3:00 AM 0.6 8:00 AM 1.0 5:00 PM 0.9 6:00 PM 1.5 Carb ratio 1:12 Sensitivity 1:50  Current orders for Inpatient glycemic control:  Levemir 10 units bid, Novolog moderate tid with meals  Inpatient Diabetes Program Recommendations:    Spoke with patient by phone.  He states that insulin pump fell off last night.  He is now on basal/bolus regimen.  He reports little appetite.    May consider reducing Novolog correction to sensitive tid with meals and HS.  Also consider adding Novolog meal coverage 3 units tid with meals (hold if patient eats less than 50%). Text page sent.  Thanks,  Beryl MeagerJenny Gisela Lea, RN, BC-ADM Inpatient Diabetes Coordinator Pager [phone] (8a-5p)

## 2017-12-14 NOTE — Progress Notes (Addendum)
Hypoglycemic Event  CBG: 65  Treatment: 15 GM carbohydrate snack  Symptoms: None  Follow-up CBG: Time:2224 CBG Result:73  Possible Reasons for Event: Inadequate meal intake; Patient claims that he has not been able to eat like he normally does due to his nausea. Patient is refusing his nightly dose of levemir due to this hypoglycemic episode. Will continue to monitor and treat per MD orders.  Comments/MD notified:Blount,NP notified.    Danny Vargas

## 2017-12-14 NOTE — Progress Notes (Signed)
[facility]. Pt alert and orient showing no signs of distress. Skin intact, right fourth toe noted to be amputated, no other skin issues noted. PIV noted to the right upper arm. Insulin pump detached from right lower abdomen. Pt currently complaining of nausea. IV fluids initiated as ordered. RN to continue to monitor.

## 2017-12-14 NOTE — Plan of Care (Signed)
Pt lying in bed resting comfortably with no signs of distress noted.

## 2017-12-14 NOTE — H&P (Signed)
History and Physical    Danny LeaderScottie O Vargas JXB:[id number] DOB: November 05, 1970 DOA: 12/14/2017  PCP: Teodoro Sprayrujillo, Jamie, MD   [facility]    Chief Complaint: N/V, coffee ground emesis  HPI: Danny Vargas is a 47 y.o. male with medical history significant of type 1 diabetes on insulin pump, diabetic gastroparesis, diabetic neuropathy, stage I CKD, hypertension, hyperlipidemia, marijuana abuse who comes in with recalcitrant nausea, vomiting, coffee-ground emesis.  Patient is a long history of episodes of nausea and vomiting and coffee-ground emesis.  Patient was recently hospitalized here on 12/01/2017 for similar episode of nausea and vomiting with coffee-ground emesis and had declined EGD as he dramatically improved.  He reports he went home and was doing fairly well until approximately the last several days when he noted his blood sugars were in the 4-5 100s.  He began to develop several episodes of nonbilious nonbloody emesis.  He did notice that it was coffee-ground when shown a picture of coffee-ground emesis.  He denied any frank hematemesis.  He denied any abdominal pain, cough, fevers, diarrhea, hematochezia, melena.  [facility] [place] where he was given some traumatic management with antiemetics and fluids and then sent home.  He continues to have episodes of nonbilious nonbloody emesis and returned to the outside hospital emergency department where he was transferred here as he was willing to undergo an EGD.  Here he was noted to have dislodgment of his insulin pump.  Of note on further discussion with the patient he also reports that he has had recurrent episodes of intermittent emesis and had been told he is diabetic gastroparesis however he also reports that he is a daily marijuana user and that he is noticed that his episodes of diabetic gastroparesis are increasing in frequency.  He reports that for proximally 1.5 years he was  abstinent and he did not have any episodes of diabetic gastroparesis.  He has not attempted to take a hot shower to improve his symptoms.  ED Course: Please see above for outside hospital ED course  Review of Systems: As per HPI otherwise 10 point review of systems negative.    Past Medical History:  Diagnosis Date  . Diabetes mellitus   . GERD (gastroesophageal reflux disease)   . HLD (hyperlipidemia) 12/14/2017  . HTN (hypertension) 12/14/2017  . Marijuana abuse 12/14/2017    Past Surgical History:  Procedure Laterality Date  . KNEE SURGERY       reports that he has been smoking. He has been smoking about 1.00 pack per day. He has never used smokeless tobacco. He reports that he does not drink alcohol or use drugs.  Allergies  Allergen Reactions  . Peanut-Containing Drug Products Itching and Rash    Not all peanuts trigger the allergic reaction of itching in the throat and ears    Family History  Problem Relation Age of Onset  . Diabetes Mother     Prior to Admission medications   Medication Sig Start Date End Date Taking? Authorizing Provider  insulin aspart (NOVOLOG) 100 UNIT/ML injection Inject 0.8 Units/hr into the skin See admin instructions. Use in insulin pump 0.6 units/hr plus boluses of 0-10 units for meals.    [provider]  lisinopril (PRINIVIL,ZESTRIL) 5 MG tablet Take 5 mg by mouth daily.    [provider]  lovastatin (MEVACOR) 20 MG tablet Take 20 mg by mouth daily.    [provider]  Physical Exam: Vitals:   12/14/17 0536  BP: (!) 146/84  Pulse: (!) 111  Resp: 16  Temp: 99 F (37.2 C)  TempSrc: Oral  SpO2: 99%  Weight: 64.4 kg  Height: 6\' 1"  (1.854 m)    Constitutional: NAD, calm, comfortable Vitals:   12/14/17 0536  BP: (!) 146/84  Pulse: (!) 111  Resp: 16  Temp: 99 F (37.2 C)  TempSrc: Oral  SpO2: 99%  Weight: 64.4 kg  Height: 6\' 1"  (1.854 m)   Eyes: Anicteric sclera ENMT: Dry mucous membranes, poor  dentition.  Neck: normal, supple Respiratory: clear to auscultation bilaterally, no wheezing, no crackles. Normal respiratory effort. No accessory muscle use.  Cardiovascular: Tachycardic, regular rhythm, no murmurs.  Abdomen: no tenderness, no masses palpated. No hepatosplenomegaly. Bowel sounds positive.  Musculoskeletal: 1+ lower extremity edema Skin: no rashes over visible skin Neurologic: Mostly intact, moving all extremities.  Psychiatric: Normal judgment and insight. Alert and oriented x 3. Normal mood.    Labs on Admission: I have personally reviewed following labs and imaging studies  CBC: No results for input(s): WBC, NEUTROABS, HGB, HCT, MCV, PLT in the last 168 hours. Basic Metabolic Panel: No results for input(s): NA, K, CL, CO2, GLUCOSE, BUN, CREATININE, CALCIUM, MG, PHOS in the last 168 hours. GFR: Estimated Creatinine Clearance: 72.3 mL/min (by C-G formula based on SCr of 1.15 mg/dL). Liver Function Tests: No results for input(s): AST, ALT, ALKPHOS, BILITOT, PROT, ALBUMIN in the last 168 hours. No results for input(s): LIPASE, AMYLASE in the last 168 hours. No results for input(s): AMMONIA in the last 168 hours. Coagulation Profile: No results for input(s): INR, PROTIME in the last 168 hours. Cardiac Enzymes: No results for input(s): CKTOTAL, CKMB, CKMBINDEX, TROPONINI in the last 168 hours. BNP (last 3 results) No results for input(s): PROBNP in the last 8760 hours. HbA1C: No results for input(s): HGBA1C in the last 72 hours. CBG: Recent Labs  Lab 12/14/17 0605  GLUCAP 342*   Lipid Profile: No results for input(s): CHOL, HDL, LDLCALC, TRIG, CHOLHDL, LDLDIRECT in the last 72 hours. Thyroid Function Tests: No results for input(s): TSH, T4TOTAL, FREET4, T3FREE, THYROIDAB in the last 72 hours. Anemia Panel: No results for input(s): VITAMINB12, FOLATE, FERRITIN, TIBC, IRON, RETICCTPCT in the last 72 hours. Urine analysis:    Component Value Date/Time    COLORURINE YELLOW 02/04/2014 0125   APPEARANCEUR CLEAR 02/04/2014 0125   LABSPEC 1.010 02/04/2014 0125   PHURINE 7.5 02/04/2014 0125   GLUCOSEU >1000 (A) 02/04/2014 0125   HGBUR NEGATIVE 02/04/2014 0125   BILIRUBINUR NEGATIVE 02/04/2014 0125   KETONESUR 40 (A) 02/04/2014 0125   PROTEINUR NEGATIVE 02/04/2014 0125   UROBILINOGEN 0.2 02/04/2014 0125   NITRITE NEGATIVE 02/04/2014 0125   LEUKOCYTESUR NEGATIVE 02/04/2014 0125    Radiological Exams on Admission: No results found.  EKG: Independently reviewed.  Sinus tachycardia, no acute ST segment changes, unchanged from prior  Assessment/Plan Active Problems:   Diabetes (HCC)   Nausea & vomiting   Gastroparesis due to DM (HCC)   Hematemesis   HTN (hypertension)   HLD (hyperlipidemia)   Marijuana abuse   #) Diabetic gastroparesis versus marijuana hyperemesis syndrome: Extensive counseling was provided.  Patient reports he would become abstinent from marijuana.  It is unclear at this time with her his symptoms are related to diabetic gastroparesis alone or possibly superimposed hyperemesis syndrome.  He is hyperglycemia over the last several days suggest that his insulin pump was dislodged and this could be contributing to  the above.  He is agreed to an EGD at this time.  He appears to be relatively dehydrated but the wise clinically well. -GI consult -IV PPI 40 mg twice daily - IV fluids - Scheduled metoclopramide and PRN promethazine -We will consider PRN haloperidol if marijuana hyperemesis syndrome is a consideration - Carb restricted diet  #) Type 1 diabetes on insulin pump: Patient reports that his continuous insulin is 0.7 units/h.  He also gives himself sliding scale with meals.  Unfortunately his insulin pump is been dislodged.  His total daily dose of insulin is approximately 17 units. -Sliding scale insulin, AC at bedtime - Start Levemir 10 units twice daily -Carb restricted diet  #) Stage I CKD/hypertension: -Continue  lisinopril 5 mg daily  #) Hyperlipidemia: -Continue statin  Fluids: IV fluids Electrolytes: Monitor and supplement Nutrition: Carb restricted diet  Prophylaxis: Enoxaparin  Disposition: Pending GI discussion and tolerating p.o.  Full code     Delaine Lame MD [facility]  If 7PM-7AM, please contact night-coverage [email or web address] Password [facility]  12/14/2017, 7:39 AM

## 2017-12-14 NOTE — H&P (View-Only) (Signed)
Subjective:   HPI  The patient has a long history of insulin-dependent diabetes. He has gastroparesis. He was admitted to the hospital because of ongoing problems with nausea vomiting and coffee-ground emesis. He smokes marijuana.  Review of Systems No chest pain or shortness of breath  Past Medical History:  Diagnosis Date  . Diabetes mellitus   . GERD (gastroesophageal reflux disease)   . HLD (hyperlipidemia) 12/14/2017  . HTN (hypertension) 12/14/2017  . Marijuana abuse 12/14/2017   Past Surgical History:  Procedure Laterality Date  . KNEE SURGERY     Social History   Socioeconomic History  . Marital status: Single    Spouse name: Not on file  . Number of children: Not on file  . Years of education: Not on file  . Highest education level: Not on file  Occupational History  . Not on file  Social Needs  . Financial resource strain: Not on file  . Food insecurity:    Worry: Not on file    Inability: Not on file  . Transportation needs:    Medical: Not on file    Non-medical: Not on file  Tobacco Use  . Smoking status: Current Every Day Smoker    Packs/day: 1.00  . Smokeless tobacco: Never Used  Substance and Sexual Activity  . Alcohol use: No  . Drug use: No  . Sexual activity: Yes  Lifestyle  . Physical activity:    Days per week: Not on file    Minutes per session: Not on file  . Stress: Not on file  Relationships  . Social connections:    Talks on phone: Not on file    Gets together: Not on file    Attends religious service: Not on file    Active member of club or organization: Not on file    Attends meetings of clubs or organizations: Not on file    Relationship status: Not on file  . Intimate partner violence:    Fear of current or ex partner: Not on file    Emotionally abused: Not on file    Physically abused: Not on file    Forced sexual activity: Not on file  Other Topics Concern  . Not on file  Social History Narrative  . Not on file   family  history includes Diabetes in his mother.  Current Facility-Administered Medications:  .  0.9 %  sodium chloride infusion, , Intravenous, Continuous, Purohit, Shrey C, MD, Last Rate: 125 mL/hr at 12/14/17 0610 .  acetaminophen (TYLENOL) tablet 650 mg, 650 mg, Oral, Q6H PRN **OR** acetaminophen (TYLENOL) suppository 650 mg, 650 mg, Rectal, Q6H PRN, Purohit, Shrey C, MD .  enoxaparin (LOVENOX) injection 40 mg, 40 mg, Subcutaneous, Q24H, Purohit, Shrey C, MD, 40 mg at 12/14/17 0934 .  feeding supplement (ENSURE ENLIVE) (ENSURE ENLIVE) liquid 237 mL, 237 mL, Oral, BID BM, Purohit, Shrey C, MD, 237 mL at 12/14/17 0931 .  insulin aspart (novoLOG) injection 0-15 Units, 0-15 Units, Subcutaneous, TID WC, Purohit, Shrey C, MD, 8 Units at 12/14/17 0931 .  insulin detemir (LEVEMIR) injection 10 Units, 10 Units, Subcutaneous, BID, Purohit, Shrey C, MD, 10 Units at 12/14/17 0934 .  lisinopril (PRINIVIL,ZESTRIL) tablet 5 mg, 5 mg, Oral, Daily, Purohit, Shrey C, MD, 5 mg at 12/14/17 0935 .  metoCLOPramide (REGLAN) injection 10 mg, 10 mg, Intravenous, Q8H, Purohit, Shrey C, MD, 10 mg at 12/14/17 0934 .  [START ON 12/17/2017] pantoprazole (PROTONIX) injection 40 mg, 40 mg, Intravenous, Q12H, Purohit, Shrey   C, MD .  polyethylene glycol (MIRALAX / GLYCOLAX) packet 17 g, 17 g, Oral, Daily PRN, Purohit, Shrey C, MD .  pravastatin (PRAVACHOL) tablet 20 mg, 20 mg, Oral, q1800, Purohit, Shrey C, MD .  promethazine (PHENERGAN) injection 12.5 mg, 12.5 mg, Intravenous, Q6H PRN, Purohit, Shrey C, MD .  traMADol (ULTRAM) tablet 50 mg, 50 mg, Oral, Q6H PRN, Purohit, Salli QuarryShrey C, MD Allergies  Allergen Reactions  . Peanut-Containing Drug Products Itching and Rash    Not all peanuts trigger the allergic reaction of itching in the throat and ears     Objective:     BP (!) 146/84 (BP Location: Right Arm) Comment: map 102  Pulse (!) 111   Temp 99 F (37.2 C) (Oral)   Resp 16   Ht 6\' 1"  (1.854 m)   Wt 64.4 kg   SpO2 99%   BMI  18.72 kg/m   No distress  Heart regular rhythm no murmurs  Lungs clear  Abdomen soft nontender  Laboratory No components found for: D1    Assessment:     Diabetes mellitus  Diabetic gastroparesis  Coffee-ground emesis      Plan:     The patient was in this hospital earlier this month and declined EGD to evaluate the same problem. He has reconsidered. He will agreed to EGD. We will plan to do this on Monday when we have anesthesia support.

## 2017-12-14 NOTE — Consult Note (Signed)
Subjective:   HPI  The patient has a long history of insulin-dependent diabetes. He has gastroparesis. He was admitted to the hospital because of ongoing problems with nausea vomiting and coffee-ground emesis. He smokes marijuana.  Review of Systems No chest pain or shortness of breath  Past Medical History:  Diagnosis Date  . Diabetes mellitus   . GERD (gastroesophageal reflux disease)   . HLD (hyperlipidemia) 12/14/2017  . HTN (hypertension) 12/14/2017  . Marijuana abuse 12/14/2017   Past Surgical History:  Procedure Laterality Date  . KNEE SURGERY     Social History   Socioeconomic History  . Marital status: Single    Spouse name: Not on file  . Number of children: Not on file  . Years of education: Not on file  . Highest education level: Not on file  Occupational History  . Not on file  Social Needs  . Financial resource strain: Not on file  . Food insecurity:    Worry: Not on file    Inability: Not on file  . Transportation needs:    Medical: Not on file    Non-medical: Not on file  Tobacco Use  . Smoking status: Current Every Day Smoker    Packs/day: 1.00  . Smokeless tobacco: Never Used  Substance and Sexual Activity  . Alcohol use: No  . Drug use: No  . Sexual activity: Yes  Lifestyle  . Physical activity:    Days per week: Not on file    Minutes per session: Not on file  . Stress: Not on file  Relationships  . Social connections:    Talks on phone: Not on file    Gets together: Not on file    Attends religious service: Not on file    Active member of club or organization: Not on file    Attends meetings of clubs or organizations: Not on file    Relationship status: Not on file  . Intimate partner violence:    Fear of current or ex partner: Not on file    Emotionally abused: Not on file    Physically abused: Not on file    Forced sexual activity: Not on file  Other Topics Concern  . Not on file  Social History Narrative  . Not on file   family  history includes Diabetes in his mother.  Current Facility-Administered Medications:  .  0.9 %  sodium chloride infusion, , Intravenous, Continuous, Purohit, Shrey C, MD, Last Rate: 125 mL/hr at 12/14/17 0610 .  acetaminophen (TYLENOL) tablet 650 mg, 650 mg, Oral, Q6H PRN **OR** acetaminophen (TYLENOL) suppository 650 mg, 650 mg, Rectal, Q6H PRN, Purohit, Shrey C, MD .  enoxaparin (LOVENOX) injection 40 mg, 40 mg, Subcutaneous, Q24H, Purohit, Shrey C, MD, 40 mg at 12/14/17 0934 .  feeding supplement (ENSURE ENLIVE) (ENSURE ENLIVE) liquid 237 mL, 237 mL, Oral, BID BM, Purohit, Shrey C, MD, 237 mL at 12/14/17 0931 .  insulin aspart (novoLOG) injection 0-15 Units, 0-15 Units, Subcutaneous, TID WC, Purohit, Shrey C, MD, 8 Units at 12/14/17 0931 .  insulin detemir (LEVEMIR) injection 10 Units, 10 Units, Subcutaneous, BID, Purohit, Salli QuarryShrey C, MD, 10 Units at 12/14/17 0934 .  lisinopril (PRINIVIL,ZESTRIL) tablet 5 mg, 5 mg, Oral, Daily, Purohit, Shrey C, MD, 5 mg at 12/14/17 0935 .  metoCLOPramide (REGLAN) injection 10 mg, 10 mg, Intravenous, Q8H, Purohit, Shrey C, MD, 10 mg at 12/14/17 0934 .  [START ON 12/17/2017] pantoprazole (PROTONIX) injection 40 mg, 40 mg, Intravenous, Q12H, Purohit, Shrey  C, MD .  polyethylene glycol (MIRALAX / GLYCOLAX) packet 17 g, 17 g, Oral, Daily PRN, Purohit, Shrey C, MD .  pravastatin (PRAVACHOL) tablet 20 mg, 20 mg, Oral, q1800, Purohit, Shrey C, MD .  promethazine (PHENERGAN) injection 12.5 mg, 12.5 mg, Intravenous, Q6H PRN, Purohit, Shrey C, MD .  traMADol (ULTRAM) tablet 50 mg, 50 mg, Oral, Q6H PRN, Purohit, Salli QuarryShrey C, MD Allergies  Allergen Reactions  . Peanut-Containing Drug Products Itching and Rash    Not all peanuts trigger the allergic reaction of itching in the throat and ears     Objective:     BP (!) 146/84 (BP Location: Right Arm) Comment: map 102  Pulse (!) 111   Temp 99 F (37.2 C) (Oral)   Resp 16   Ht 6\' 1"  (1.854 m)   Wt 64.4 kg   SpO2 99%   BMI  18.72 kg/m   No distress  Heart regular rhythm no murmurs  Lungs clear  Abdomen soft nontender  Laboratory No components found for: D1    Assessment:     Diabetes mellitus  Diabetic gastroparesis  Coffee-ground emesis      Plan:     The patient was in this hospital earlier this month and declined EGD to evaluate the same problem. He has reconsidered. He will agreed to EGD. We will plan to do this on Monday when we have anesthesia support.

## 2017-12-14 NOTE — Progress Notes (Signed)
Initial Nutrition Assessment  DOCUMENTATION CODES:   Not applicable  INTERVENTION:  Continue Ensure Enlive po BID, each supplement provides 350 kcal and 20 grams of protein  NUTRITION DIAGNOSIS:   Inadequate oral intake related to nausea, vomiting as evidenced by per patient/family report  GOAL:   Patient will meet greater than or equal to 90% of their needs  MONITOR:   PO intake, Supplement acceptance, Labs, I & O's  REASON FOR ASSESSMENT:   Malnutrition Screening Tool    ASSESSMENT:   Patient with PMH T1DM, diabetic gastroparesis, diabetic neuropathy, CKD, HTN, HLD, marijuana abuse with recalcitrant nausea, vomiting, and coffee ground emesis. He was recently hospitalized on 12/01/2017 for similar episode of nausea, vomiting, coffee ground emesis. He was doing well until several days ago when his blood sugars were highly elevated and he began to develop several episodes of coffee ground emesis. Patient reports intermittent vomiting at home that seems to be exacerbated by marijuana use.  Spoke with patient at bedside. He reports abstaining from marijuana for 1.5 years but began to smoke it again 3 weeks ago. Patient did not have any vomiting over the 1.5 years he did not smoke but began to experience intermittent vomiting again when he restarted use. UBW of 156 pounds but complains of 12 pound weight loss over the past 3 weeks. Usual PO intake consists of beanie weanies and chips for breakfast, chips and a gatorade for lunch, and fast food or somewhere out to eat for dinner. Patient reports he was eating ok prior to developing nausea and vomiting again a few days ago. He had a few bites of breakfast this morning but was complaining of nausea during this RD's visit. Will continue ensure as ordered per MD and monitor CBGs.  Labs reviewed:  CBGs 269, 190  Medications reviewed and include:  Insulin, Reglan NS at 17025mL/hr   NUTRITION - FOCUSED PHYSICAL EXAM:    Most Recent Value   Orbital Region  No depletion  Upper Arm Region  No depletion  Thoracic and Lumbar Region  No depletion  Buccal Region  No depletion  Temple Region  No depletion  Clavicle Bone Region  No depletion  Clavicle and Acromion Bone Region  No depletion  Scapular Bone Region  No depletion  Dorsal Hand  No depletion  Patellar Region  No depletion  Anterior Thigh Region  No depletion  Posterior Calf Region  No depletion  Edema (RD Assessment)  None  Hair  Reviewed  Eyes  Reviewed  Mouth  Reviewed  Skin  Reviewed  Nails  Reviewed       Diet Order:   Diet Order            Diet Carb Modified Fluid consistency: Thin; Room service appropriate? Yes  Diet effective now              EDUCATION NEEDS:   No education needs have been identified at this time  Skin:  Skin Assessment: Reviewed RN Assessment  Last BM:  PTA  Height:   Ht Readings from Last 1 Encounters:  12/14/17 6\' 1"  (1.854 m)    Weight:   Wt Readings from Last 1 Encounters:  12/14/17 64.4 kg    Ideal Body Weight:  83.63 kg  BMI:  Body mass index is 18.72 kg/m.  Estimated Nutritional Needs:   Kcal:  1900-2200 calories (MSJ 1.2-1.4)  Protein:  80-100 grams  Fluid:  >2L    Dionne AnoWilliam M. Siriyah Ambrosius, MS, RD LDN Inpatient Clinical Dietitian  Pager [phone]

## 2017-12-14 NOTE — Consult Note (Deleted)
coffee-ground emesis  Diabetic gastroparesis    Plan:     at this time I would recommend that he be on an acid reducing agent. Also he has agreed to EGD. We will plan to do this on Monday when we have anesthesia support with propofol.

## 2017-12-15 ENCOUNTER — Encounter (HOSPITAL_COMMUNITY): Payer: Self-pay

## 2017-12-15 ENCOUNTER — Other Ambulatory Visit: Payer: Self-pay

## 2017-12-15 DIAGNOSIS — R112 Nausea with vomiting, unspecified: Secondary | ICD-10-CM

## 2017-12-15 LAB — GLUCOSE, CAPILLARY
GLUCOSE-CAPILLARY: 270 mg/dL — AB (ref 70–99)
GLUCOSE-CAPILLARY: 158 mg/dL — AB (ref 70–99)
Glucose-Capillary: 235 mg/dL — ABNORMAL HIGH (ref 70–99)
Glucose-Capillary: 267 mg/dL — ABNORMAL HIGH (ref 70–99)
Glucose-Capillary: 203 mg/dL — ABNORMAL HIGH (ref 70–99)
Glucose-Capillary: 81 mg/dL (ref 70–99)
Glucose-Capillary: 158 mg/dL — ABNORMAL HIGH (ref 70–99)

## 2017-12-15 LAB — CBC
HCT: 37.5 % — ABNORMAL LOW (ref 39.0–52.0)
Hemoglobin: 11.8 g/dL — ABNORMAL LOW (ref 13.0–17.0)
MCH: 26.4 pg (ref 26.0–34.0)
MCHC: 31.5 g/dL (ref 30.0–36.0)
MCV: 83.9 fL (ref 78.0–100.0)
Platelets: 348 K/uL (ref 150–400)
RBC: 4.47 MIL/uL (ref 4.22–5.81)
RDW: 15.8 % — ABNORMAL HIGH (ref 11.5–15.5)
WBC: 16 K/uL — ABNORMAL HIGH (ref 4.0–10.5)

## 2017-12-15 LAB — BASIC METABOLIC PANEL
Anion gap: 8 (ref 5–15)
Calcium: 8.8 mg/dL — ABNORMAL LOW (ref 8.9–10.3)
GFR calc Af Amer: >60 mL/min (ref >60)
GFR calc non Af Amer: >60 mL/min (ref >60)
Sodium: 143 mmol/L (ref 135–145)

## 2017-12-15 LAB — BASIC METABOLIC PANEL WITH GFR
BUN: 7 mg/dL (ref 6–20)
CO2: 25 mmol/L (ref 22–32)
Chloride: 110 mmol/L (ref 98–111)
Creatinine, Ser: 1.02 mg/dL (ref 0.61–1.24)
Glucose, Bld: 257 mg/dL — ABNORMAL HIGH (ref 70–99)
Potassium: 3.8 mmol/L (ref 3.5–5.1)

## 2017-12-15 MED ORDER — INSULIN ASPART 100 UNIT/ML ~~LOC~~ SOLN
3.0000 [IU] | Freq: Three times a day (TID) | SUBCUTANEOUS | Status: DC
  Administered 2017-12-15 – 2017-12-17 (×2): 3 [IU] via SUBCUTANEOUS

## 2017-12-15 NOTE — Progress Notes (Signed)
At 2153, patient's CBG was 65. Patient drank a cup of apple juice and patient's CBG only went up to 73. Patient drank another 1/2 cup of apple juice and his CBG finally went up to 174. Patient refused to take his dose of bedtime Levemir due to his hypoglycemic episode even after his CBG increased. RN educated the importance of taking his long-lasting insulin and patient continued to refuse. Blount,NP notified and no new orders were placed. At 0151, RN rechecked patient's CBG and it was 235. Blount,NP notified and asked about giving the patient insulin for this elevated CBG. Blount,NP returned call and said to just continue to monitor the patient's CBG for now. No new orders placed at this time. Will continue to monitor and treat per MD orders.

## 2017-12-15 NOTE — Progress Notes (Signed)
PROGRESS NOTE    Danny Vargas  ZOX:[id number] DOB: March 31, 1971 DOA: 12/14/2017 PCP: Teodoro Sprayrujillo, Jamie, MD   Brief Narrative: Patient is a 47 year old male with past medical history of type 1 diabetes mellitus, diabetic gastroparesis, diabetic neuropathy, CKD stage  1, hypertension, hyperlipidemia, marijuana abuse who presents to the emergency department with complaints of persistent nausea, vomiting and coffee-ground emesis.  He was hospitalized here recently on 12/01/2017 for similar problems and was discharged because a decline EGD.  Patient agreeable for EGD this time.  GI have already evaluated him.  Planning for upper GI endoscopy tomorrow.  His hemoglobin is stable currently.  Assessment & Plan:   Active Problems:   Diabetes (HCC)   Nausea & vomiting   Gastroparesis due to DM (HCC)   Hematemesis   HTN (hypertension)   HLD (hyperlipidemia)   Marijuana abuse   Coffee-ground emesis/hematemesis: Currently resolved.  His H&H is stable.  Hemodynamically stable GI has already evaluated the patient.  Planning for upper EGD tomorrow.  N.p.o. after midnight.  Diabetic gastroparesis/nausea/vomiting: Continue IV fluids, antiemetics, PPI.  Could also be from marijuana hyperemesis syndrome.  Currently his nausea and vomiting have improved.  Diabetes type 1: Was on insulin pump at home.  Unfortunately his insulin pump has been dislodged.  Might need to discharge him on subcutaneous insulin.  Diabetic coordinator following and will wait for recommendation.  Stage I CKD: Currently kidney function stable.  We will continue to monitor  Hypertension: On lisinopril 5 mg which we will continue  Hyperlipidemia: Continue statin  Leukocytosis: Most likely reactive.  Will hold antibiotics.  Will check CBC tomorrow.   DVT prophylaxis: Lovenox Code Status: Full Family Communication: None present at the bedside Disposition Plan: Likely home after visit tomorrow   Consultants: GI  Procedures:  None  Antimicrobials: None  Subjective: Patient seen and  examined at the bedside this morning.  Denies any nausea, vomiting or abdominal pain.  No more episodes of hematemesis or coffee-ground emesis.   Objective: Vitals:   12/14/17 1405 12/14/17 2153 12/15/17 0532 12/15/17 1357  BP: (!) 149/87 (!) 140/93 (!) 144/93 127/90  Pulse: (!) 102 (!) 104 (!) 101 100  Resp: 18 16 18 18   Temp: 98.4 F (36.9 C) 99 F (37.2 C) 98.9 F (37.2 C) 98.8 F (37.1 C)  TempSrc: Oral Oral Oral Oral  SpO2: 100% 99% 98% 97%  Weight:      Height:        Intake/Output Summary (Last 24 hours) at 12/15/2017 1413 Last data filed at 12/14/2017 1800 Gross per 24 hour  Intake 70.8 ml  Output -  Net 70.8 ml   Filed Weights   12/14/17 0536  Weight: 64.4 kg    Examination:  General exam: Not in distress,thin built, mild generalised weakness HEENT:PERRL,Oral mucosa moist, Ear/Nose normal on gross exam Respiratory system: Bilateral equal air entry, normal vesicular breath sounds, no wheezes or crackles  Cardiovascular system: S1 & S2 heard, RRR. No JVD, murmurs, rubs, gallops or clicks. No pedal edema. Gastrointestinal system: Abdomen is nondistended, soft and nontender. No organomegaly or masses felt. Normal bowel sounds heard. Central nervous system: Alert and oriented. No focal neurological deficits. Extremities: No edema, no clubbing ,no cyanosis, distal peripheral pulses palpable. Skin: No rashes, lesions or ulcers,no icterus ,no pallor MSK: Normal muscle bulk,tone ,power Psychiatry: Judgement and insight appear normal. Mood & affect appropriate.     Data Reviewed: I have personally reviewed following labs and imaging studies  CBC: Recent Labs  Lab 12/14/17 0706 12/15/17 0439  WBC 17.1* 16.0*  HGB 12.8* 11.8*  HCT 41.1 37.5*  MCV 85.4 83.9  PLT 409* 348   Basic Metabolic Panel: Recent Labs  Lab 12/14/17 0706 12/15/17 0439  NA 145 143  K 4.0 3.8  CL 109 110  CO2 18* 25   GLUCOSE 342* 257*  BUN 13 7  CREATININE 1.37* 1.02  CALCIUM 8.8* 8.8*   GFR: Estimated Creatinine Clearance: 81.6 mL/min (by C-G formula based on SCr of 1.02 mg/dL). Liver Function Tests: Recent Labs  Lab 12/14/17 0706  AST 20  ALT 20  ALKPHOS 124  BILITOT 1.2  PROT 6.5  ALBUMIN 3.4*   No results for input(s): LIPASE, AMYLASE in the last 168 hours. No results for input(s): AMMONIA in the last 168 hours. Coagulation Profile: No results for input(s): INR, PROTIME in the last 168 hours. Cardiac Enzymes: No results for input(s): CKTOTAL, CKMB, CKMBINDEX, TROPONINI in the last 168 hours. BNP (last 3 results) No results for input(s): PROBNP in the last 8760 hours. HbA1C: No results for input(s): HGBA1C in the last 72 hours. CBG: Recent Labs  Lab 12/14/17 2250 12/15/17 0151 12/15/17 0519 12/15/17 0750 12/15/17 1139  GLUCAP 174* 235* 267* 270* 203*   Lipid Profile: No results for input(s): CHOL, HDL, LDLCALC, TRIG, CHOLHDL, LDLDIRECT in the last 72 hours. Thyroid Function Tests: No results for input(s): TSH, T4TOTAL, FREET4, T3FREE, THYROIDAB in the last 72 hours. Anemia Panel: No results for input(s): VITAMINB12, FOLATE, FERRITIN, TIBC, IRON, RETICCTPCT in the last 72 hours. Sepsis Labs: No results for input(s): PROCALCITON, LATICACIDVEN in the last 168 hours.  No results found for this or any previous visit (from the past 240 hour(s)).       Radiology Studies: No results found.      Scheduled Meds: . enoxaparin (LOVENOX) injection  40 mg Subcutaneous Q24H  . feeding supplement (ENSURE ENLIVE)  237 mL Oral BID BM  . insulin aspart  0-15 Units Subcutaneous TID WC  . insulin aspart  3 Units Subcutaneous TID WC  . insulin detemir  10 Units Subcutaneous BID  . lisinopril  5 mg Oral Daily  . metoCLOPramide (REGLAN) injection  10 mg Intravenous Q8H  . pantoprazole  40 mg Intravenous Q12H  . pravastatin  20 mg Oral q1800   Continuous Infusions:   LOS: 1  day    Time spent:25 mins. More than 50% of that time was spent in counseling and/or coordination of care.      Burnadette PopAmrit Klarisa Barman, MD [facility] Pager [phone]  If 7PM-7AM, please contact night-coverage [email or web address] Password [email or web address] 12/15/2017, 2:13 PM

## 2017-12-15 NOTE — Progress Notes (Signed)
No complaints of vomiting or hematemesis today. Patient is still agreeable to EGD to evaluate his history of hematemesis. We will set this up for tomorrow.

## 2017-12-16 ENCOUNTER — Inpatient Hospital Stay (HOSPITAL_COMMUNITY): Payer: BLUE CROSS/BLUE SHIELD | Admitting: Anesthesiology

## 2017-12-16 ENCOUNTER — Encounter (HOSPITAL_COMMUNITY): Admission: AD | Disposition: A | Discharge status: Home / Self Care | Payer: Self-pay | Attending: Internal Medicine

## 2017-12-16 ENCOUNTER — Encounter (HOSPITAL_COMMUNITY): Payer: Self-pay | Admitting: *Deleted

## 2017-12-16 DIAGNOSIS — K209 Esophagitis, unspecified without bleeding: Secondary | ICD-10-CM

## 2017-12-16 HISTORY — PX: ESOPHAGOGASTRODUODENOSCOPY (EGD) WITH PROPOFOL: SHX5813

## 2017-12-16 LAB — CBC WITH DIFFERENTIAL/PLATELET
ABS IMMATURE GRANULOCYTES: 0.1 10*3/uL (ref 0.0–0.1)
Basophils Absolute: 0 10*3/uL (ref 0.0–0.1)
Basophils Relative: 0 %
Eosinophils Absolute: 0.1 10*3/uL (ref 0.0–0.7)
Eosinophils Relative: 1 %
HEMATOCRIT: 40.6 % (ref 39.0–52.0)
Hemoglobin: 12.9 g/dL — ABNORMAL LOW (ref 13.0–17.0)
IMMATURE GRANULOCYTES: 0 %
LYMPHS ABS: 2.9 10*3/uL (ref 0.7–4.0)
Lymphocytes Relative: 25 %
MCH: 26.6 pg (ref 26.0–34.0)
MCHC: 31.8 g/dL (ref 30.0–36.0)
MCV: 83.7 fL (ref 78.0–100.0)
MONOS PCT: 7 %
Monocytes Absolute: 0.8 10*3/uL (ref 0.1–1.0)
NEUTROS ABS: 7.6 10*3/uL (ref 1.7–7.7)
NEUTROS PCT: 67 %
Platelets: 340 10*3/uL (ref 150–400)
RBC: 4.85 MIL/uL (ref 4.22–5.81)
RDW: 15.3 % (ref 11.5–15.5)
WBC: 11.5 10*3/uL — ABNORMAL HIGH (ref 4.0–10.5)

## 2017-12-16 LAB — GLUCOSE, CAPILLARY
GLUCOSE-CAPILLARY: 123 mg/dL — AB (ref 70–99)
GLUCOSE-CAPILLARY: 135 mg/dL — AB (ref 70–99)
GLUCOSE-CAPILLARY: 132 mg/dL — AB (ref 70–99)
Glucose-Capillary: 172 mg/dL — ABNORMAL HIGH (ref 70–99)

## 2017-12-16 SURGERY — ESOPHAGOGASTRODUODENOSCOPY (EGD) WITH PROPOFOL
Anesthesia: General

## 2017-12-16 MED ORDER — ONDANSETRON HCL 4 MG/2ML IJ SOLN
INTRAMUSCULAR | Status: DC | PRN
  Administered 2017-12-16: 4 mg via INTRAVENOUS

## 2017-12-16 MED ORDER — SUCRALFATE 1 GM/10ML PO SUSP
1.0000 g | Freq: Three times a day (TID) | ORAL | Status: DC
  Administered 2017-12-16 – 2017-12-17 (×5): 1 g via ORAL
  Filled 2017-12-16 (×6): qty 10

## 2017-12-16 MED ORDER — SODIUM CHLORIDE 0.9 % IV SOLN: INTRAVENOUS | Status: DC

## 2017-12-16 MED ORDER — SUCCINYLCHOLINE CHLORIDE 200 MG/10ML IV SOSY
PREFILLED_SYRINGE | INTRAVENOUS | Status: DC | PRN
  Administered 2017-12-16: 120 mg via INTRAVENOUS

## 2017-12-16 MED ORDER — FENTANYL CITRATE (PF) 100 MCG/2ML IJ SOLN
INTRAMUSCULAR | Status: DC | PRN
  Administered 2017-12-16: 100 ug via INTRAVENOUS

## 2017-12-16 MED ORDER — PROPOFOL 10 MG/ML IV BOLUS
INTRAVENOUS | Status: DC | PRN
  Administered 2017-12-16: 200 mg via INTRAVENOUS

## 2017-12-16 MED ORDER — LACTATED RINGERS IV SOLN
INTRAVENOUS | Status: DC | PRN
  Administered 2017-12-16: 11:00:00 via INTRAVENOUS

## 2017-12-16 MED ORDER — LIDOCAINE 2% (20 MG/ML) 5 ML SYRINGE
INTRAMUSCULAR | Status: DC | PRN
  Administered 2017-12-16: 100 mg via INTRAVENOUS

## 2017-12-16 MED ORDER — FENTANYL CITRATE (PF) 100 MCG/2ML IJ SOLN
INTRAMUSCULAR | Status: AC
  Filled 2017-12-16: qty 2

## 2017-12-16 SURGICAL SUPPLY — 15 items

## 2017-12-16 NOTE — Interval H&P Note (Signed)
History and Physical Interval Note:  12/16/2017 11:12 AM  Danny Vargas  has presented today for surgery, with the diagnosis of hematemesis  The various methods of treatment have been discussed with the patient and family. After consideration of risks, benefits and other options for treatment, the patient has consented to  Procedure(s): ESOPHAGOGASTRODUODENOSCOPY (EGD) WITH PROPOFOL (N/A) as a surgical intervention .  The patient's history has been reviewed, patient examined, no change in status, stable for surgery.  I have reviewed the patient's chart and labs.  Questions were answered to the patient's satisfaction.     Tresea MallJames L Filbert Craze

## 2017-12-16 NOTE — Transfer of Care (Signed)
Immediate Anesthesia Transfer of Care Note  Patient: Danny Vargas  Procedure(s) Performed: ESOPHAGOGASTRODUODENOSCOPY (EGD) WITH PROPOFOL (N/A )  Patient Location: PACU  Anesthesia Type:General  Level of Consciousness: awake, drowsy and patient cooperative  Airway & Oxygen Therapy: Patient Spontanous Breathing and Patient connected to nasal cannula oxygen  Post-op Assessment: Report given to RN, Post -op Vital signs reviewed and stable and Patient moving all extremities  Post vital signs: Reviewed and stable  Last Vitals:  Vitals Value Taken Time  BP    Temp    Pulse    Resp    SpO2      Last Pain:  Vitals:   12/16/17 1009  TempSrc: Oral  PainSc: 0-No pain         Complications: No apparent anesthesia complications

## 2017-12-16 NOTE — Op Note (Signed)
[facility] Patient Name: Danny Vargas Procedure Date : 12/16/2017 MRN: [id number] Attending MD: Tresea Mall Dr., MD Date of Birth: 1970/05/23 CSN: [id number] Age: 47 Admit Type: Inpatient Procedure:                Upper GI endoscopy Indications:              Hematemesis Providers:                Fayrene Fearing L. Nikkia Devoss Dr., MD, Tomma Rakers, RN,                            Margo Aye, Technician, Levora Angel, CRNA Referring MD:              Medicines:                Monitored Anesthesia Care Complications:            No immediate complications. Estimated Blood Loss:     Estimated blood loss: none. Procedure:                Pre-Anesthesia Assessment:                           - Prior to the procedure, a History and Physical                            was performed, and patient medications and                            allergies were reviewed. The patient's tolerance of                            previous anesthesia was also reviewed. The risks                            and benefits of the procedure and the sedation                            options and risks were discussed with the patient.                            All questions were answered, and informed consent                            was obtained. Prior Anticoagulants: The patient has                            taken no previous anticoagulant or antiplatelet                            agents. ASA Grade Assessment: II - A patient with                            mild systemic disease. After reviewing the risks  and benefits, the patient was deemed in                            satisfactory condition to undergo the procedure.                           After obtaining informed consent, the endoscope was                            passed under direct vision. Throughout the                            procedure, the patient's blood pressure, pulse, and                            oxygen  saturations were monitored continuously. The                            GIF-H190 ([id number] Olympus adult EGD was introduced                            through the mouth, and advanced to the second part                            of duodenum. The upper GI endoscopy was                            accomplished without difficulty. Due to the pt's                            continued vomiting he was entubated by anesthesia.                            The patient tolerated the procedure well. Scope In: Scope Out: Findings:      Diffuse severe mucosal changes characterized by inflammation, linear       erosions and ulceration were found in the middle third of the esophagus       and in the lower third of the esophagus.      The stomach was normal.      The examined duodenum was normal. Impression:               - Inflamed, linearly eroded, ulcerated mucosa in                            the esophagus. this was probably result of severe                            vomiting as well as esophageal reflux.                           - Normal stomach.                           - Normal examined duodenum.                           -  No specimens collected. Recommendation:           - Return patient to hospital ward for ongoing care.                           - Use sucralfate suspension 1 gram PO QID. Procedure Code(s):        --- Professional ---                           [phone], Esophagogastroduodenoscopy, flexible,                            transoral; diagnostic, including collection of                            specimen(s) by brushing or washing, when performed                            (separate procedure) Diagnosis Code(s):        --- Professional ---                           K22.10, Ulcer of esophagus without bleeding                           K20.9, Esophagitis, unspecified                           K92.0, Hematemesis CPT copyright 2017 [employer] Medical Association. All rights reserved. The  codes documented in this report are preliminary and upon coder review may  be revised to meet current compliance requirements. Tresea MallJames L Caisen Mangas Dr., MD 12/16/2017 11:50:49 AM This report has been signed electronically. Number of Addenda: 0

## 2017-12-16 NOTE — Anesthesia Postprocedure Evaluation (Signed)
Anesthesia Post Note  Patient: Danny Vargas  Procedure(s) Performed: ESOPHAGOGASTRODUODENOSCOPY (EGD) WITH PROPOFOL (N/A )     Patient location during evaluation: PACU Anesthesia Type: General Level of consciousness: awake and alert Pain management: pain level controlled Vital Signs Assessment: post-procedure vital signs reviewed and stable Respiratory status: spontaneous breathing, nonlabored ventilation and respiratory function stable Cardiovascular status: blood pressure returned to baseline and stable Postop Assessment: no apparent nausea or vomiting Anesthetic complications: no    Last Vitals:  Vitals:   12/16/17 1155 12/16/17 1200  BP: (!) 150/93 (!) 157/88  Pulse: 93 86  Resp: (!) 22 15  Temp:    SpO2: 98% 99%    Last Pain:  Vitals:   12/16/17 1200  TempSrc:   PainSc: 0-No pain                 Beryle Lathehomas E Minta Fair

## 2017-12-16 NOTE — Anesthesia Preprocedure Evaluation (Addendum)
Anesthesia Evaluation  Patient identified by MRN, date of birth, ID band Patient awake    Reviewed: Allergy & Precautions, NPO status , Patient's Chart, lab work & pertinent test results  History of Anesthesia Complications Negative for: history of anesthetic complications  Airway Mallampati: II  TM Distance: >3 FB Neck ROM: Full    Dental  (+) Dental Advisory Given, Teeth Intact   Pulmonary Current Smoker,    breath sounds clear to auscultation       Cardiovascular hypertension, Pt. on medications  Rhythm:Regular Rate:Normal     Neuro/Psych negative neurological ROS  negative psych ROS   GI/Hepatic GERD  Medicated,(+)     substance abuse  marijuana use,   Endo/Other  diabetes, Insulin Dependent  Renal/GU negative Renal ROS  negative genitourinary   Musculoskeletal negative musculoskeletal ROS (+)   Abdominal   Peds  Hematology  (+) anemia ,   Anesthesia Other Findings   Reproductive/Obstetrics                            Anesthesia Physical Anesthesia Plan  ASA: II  Anesthesia Plan: General   Post-op Pain Management:    Induction: Intravenous and Rapid sequence  PONV Risk Score and Plan: 1 and Treatment may vary due to age or medical condition and Ondansetron  Airway Management Planned: Oral ETT  Additional Equipment: None  Intra-op Plan:   Post-operative Plan: Extubation in OR  Informed Consent: I have reviewed the patients History and Physical, chart, labs and discussed the procedure including the risks, benefits and alternatives for the proposed anesthesia with the patient or authorized representative who has indicated his/her understanding and acceptance.   Dental advisory given  Plan Discussed with: CRNA and Anesthesiologist  Anesthesia Plan Comments: (Patient still feeling nauseous, dry heaving this AM. Will intubate to protect airway, RSI.)      Anesthesia  Quick Evaluation

## 2017-12-16 NOTE — Progress Notes (Signed)
PROGRESS NOTE    Danny Vargas  [id number] DOB: [birth year] DOA: 12/14/2017 PCP: Teodoro Sprayrujillo, Jamie, MD   Brief Narrative: Patient is a 47 year old male with past medical history of type 1 diabetes mellitus, diabetic gastroparesis, diabetic neuropathy, CKD stage  1, hypertension, hyperlipidemia, marijuana abuse who presents to the emergency department with complaints of persistent nausea, vomiting and coffee-ground emesis.  He was hospitalized here recently on 12/01/2017 for similar problems and was discharged because a decline EGD.  Patient underwent upper GI endoscopy with finding of diffuse severe mucosal changes lateral inflammation, linear erosions and ulceration in the middle third and lower third of the esophagus.  His hemoglobin is stable currently.  Assessment & Plan:   Principal Problem:   Esophagitis Active Problems:   Diabetes (HCC)   Nausea & vomiting   Gastroparesis due to DM (HCC)   Hematemesis   HTN (hypertension)   HLD (hyperlipidemia)   Marijuana abuse  Esophagitis: Patient underwent upper GI endoscopy with finding of diffuse severe mucosal changes lateral inflammation, linear erosions and ulceration in the middle third and lower third of the esophagus.  Start on Carafate.  We will continue PPI.  We will continue soft diet for now.  Coffee-ground emesis/hematemesis: Currently resolved.  His H&H is stable.  Hemodynamically stable.  Diabetic gastroparesis/nausea/vomiting: Continue  PPI. Currently his nausea and vomiting have improved.  Diabetes type 1: Was on insulin pump at home.  Follows with endocrinologist as an outpatient.  Diabetic coordinator following and will wait for recommendation.  Stage I CKD: Currently kidney function stable.  We will continue to monitor  Hypertension: On lisinopril 5 mg which we will continue  Hyperlipidemia: Continue statin  Leukocytosis: Most likely reactive.  Will hold antibiotics.  Improving.   DVT prophylaxis: Lovenox Code  Status: Full Family Communication: None present at the bedside Disposition Plan: Home tomorrow  Consultants: GI  Procedures: None  Antimicrobials: None  Subjective: Patient seen and  examined at the bedside this morning.  Denies any nausea, vomiting or abdominal pain.  No more episodes of hematemesis or coffee-ground emesis.  Underwent EGD today with findings of esophagitis.   Objective: Vitals:   12/16/17 1009 12/16/17 1140 12/16/17 1155 12/16/17 1200  BP: (!) 146/104 (!) 160/91 (!) 150/93 (!) 157/88  Pulse: (!) 104  93 86  Resp: 18 11 (!) 22 15  Temp: 99.5 F (37.5 C) 98.7 F (37.1 C)    TempSrc: Oral Oral    SpO2: 100% 100% 98% 99%  Weight:      Height:        Intake/Output Summary (Last 24 hours) at 12/16/2017 1312 Last data filed at 12/16/2017 1142 Gross per 24 hour  Intake 700 ml  Output -  Net 700 ml   Filed Weights   12/14/17 0536  Weight: 64.4 kg    Examination:  General exam: Appears calm and comfortable ,Not in distress,thin built HEENT:PERRL,Oral mucosa moist, Ear/Nose normal on gross exam Respiratory system: Bilateral equal air entry, normal vesicular breath sounds, no wheezes or crackles  Cardiovascular system: S1 & S2 heard, RRR. No JVD, murmurs, rubs, gallops or clicks. Gastrointestinal system: Abdomen is nondistended, soft and nontender. No organomegaly or masses felt. Normal bowel sounds heard. Central nervous system: Alert and oriented. No focal neurological deficits. Extremities: No edema, no clubbing ,no cyanosis, distal peripheral pulses palpable. Skin: No rashes, lesions or ulcers,no icterus ,no pallor MSK: Normal muscle bulk,tone ,power Psychiatry: Judgement and insight appear normal. Mood & affect appropriate.  Data Reviewed: I have personally reviewed following labs and imaging studies  CBC: Recent Labs  Lab 12/14/17 0706 12/15/17 0439 12/16/17 0833  WBC 17.1* 16.0* 11.5*  NEUTROABS  --   --  7.6  HGB 12.8* 11.8* 12.9*    HCT 41.1 37.5* 40.6  MCV 85.4 83.9 83.7  PLT 409* 348 340   Basic Metabolic Panel: Recent Labs  Lab 12/14/17 0706 12/15/17 0439  NA 145 143  K 4.0 3.8  CL 109 110  CO2 18* 25  GLUCOSE 342* 257*  BUN 13 7  CREATININE 1.37* 1.02  CALCIUM 8.8* 8.8*   GFR: Estimated Creatinine Clearance: 81.6 mL/min (by C-G formula based on SCr of 1.02 mg/dL). Liver Function Tests: Recent Labs  Lab 12/14/17 0706  AST 20  ALT 20  ALKPHOS 124  BILITOT 1.2  PROT 6.5  ALBUMIN 3.4*   No results for input(s): LIPASE, AMYLASE in the last 168 hours. No results for input(s): AMMONIA in the last 168 hours. Coagulation Profile: No results for input(s): INR, PROTIME in the last 168 hours. Cardiac Enzymes: No results for input(s): CKTOTAL, CKMB, CKMBINDEX, TROPONINI in the last 168 hours. BNP (last 3 results) No results for input(s): PROBNP in the last 8760 hours. HbA1C: No results for input(s): HGBA1C in the last 72 hours. CBG: Recent Labs  Lab 12/15/17 1703 12/15/17 2110 12/15/17 2226 12/16/17 0831 12/16/17 1255  GLUCAP 158* 81 158* 172* 123*   Lipid Profile: No results for input(s): CHOL, HDL, LDLCALC, TRIG, CHOLHDL, LDLDIRECT in the last 72 hours. Thyroid Function Tests: No results for input(s): TSH, T4TOTAL, FREET4, T3FREE, THYROIDAB in the last 72 hours. Anemia Panel: No results for input(s): VITAMINB12, FOLATE, FERRITIN, TIBC, IRON, RETICCTPCT in the last 72 hours. Sepsis Labs: No results for input(s): PROCALCITON, LATICACIDVEN in the last 168 hours.  No results found for this or any previous visit (from the past 240 hour(s)).       Radiology Studies: No results found.      Scheduled Meds: . enoxaparin (LOVENOX) injection  40 mg Subcutaneous Q24H  . feeding supplement (ENSURE ENLIVE)  237 mL Oral BID BM  . insulin aspart  0-15 Units Subcutaneous TID WC  . insulin aspart  3 Units Subcutaneous TID WC  . insulin detemir  10 Units Subcutaneous BID  . lisinopril  5 mg  Oral Daily  . metoCLOPramide (REGLAN) injection  10 mg Intravenous Q8H  . pantoprazole  40 mg Intravenous Q12H  . pravastatin  20 mg Oral q1800  . sucralfate  1 g Oral TID WC & HS   Continuous Infusions:   LOS: 2 days    Time spent:25 mins. More than 50% of that time was spent in counseling and/or coordination of care.      Burnadette PopAmrit Metha Kolasa, MD [facility] Pager [phone]  If 7PM-7AM, please contact night-coverage [email or web address] Password [email or web address] 12/16/2017, 1:12 PM

## 2017-12-16 NOTE — Anesthesia Procedure Notes (Signed)
Procedure Name: Intubation Date/Time: 12/16/2017 11:13 AM Performed by: Lowella Dell, CRNA Pre-anesthesia Checklist: Patient identified, Emergency Drugs available, Suction available and Patient being monitored Patient Re-evaluated:Patient Re-evaluated prior to induction Oxygen Delivery Method: Circle System Utilized Preoxygenation: Pre-oxygenation with 100% oxygen Induction Type: IV induction, Cricoid Pressure applied and Rapid sequence Laryngoscope Size: Mac and 4 Grade View: Grade I Tube type: Oral Number of attempts: 1 Airway Equipment and Method: Stylet Placement Confirmation: ETT inserted through vocal cords under direct vision,  positive ETCO2 and breath sounds checked- equal and bilateral Secured at: 22 cm Tube secured with: Tape Dental Injury: Teeth and Oropharynx as per pre-operative assessment  Comments: RSI d/t nausea/dry heaving this am

## 2017-12-17 ENCOUNTER — Encounter (HOSPITAL_COMMUNITY): Payer: Self-pay | Admitting: Gastroenterology

## 2017-12-17 DIAGNOSIS — K209 Esophagitis, unspecified: Secondary | ICD-10-CM

## 2017-12-17 LAB — GLUCOSE, CAPILLARY
GLUCOSE-CAPILLARY: 250 mg/dL — AB (ref 70–99)
Glucose-Capillary: 269 mg/dL — ABNORMAL HIGH (ref 70–99)

## 2017-12-17 MED ORDER — PANTOPRAZOLE SODIUM 40 MG PO TBEC: 40.0000 mg | DELAYED_RELEASE_TABLET | Freq: Two times a day (BID) | ORAL | 0 refills | Status: DC

## 2017-12-17 MED ORDER — SUCRALFATE 1 GM/10ML PO SUSP: ORAL | 0 refills | Status: DC

## 2017-12-17 NOTE — Progress Notes (Signed)
Danny Vargas to be D/C'd home per MD order. Discussed with the patient and all questions fully answered. VVS, Skin clean, dry and intact without evidence of skin break down, no evidence of skin tears noted.  IV catheter discontinued intact. Site without signs and symptoms of complications. Dressing and pressure applied.  An After Visit Summary, prescriptions, and doctor's note was printed and given to the patient.  Patient escorted via WC, and D/C home via private auto.  Jon Gillslisa R [name]  12/17/2017 4:47 PM

## 2017-12-17 NOTE — Progress Notes (Signed)
Subjective: Patient was seen and examined at bedside. He states that he has had a lot of burping and some epigastric abdominal discomfort, but no further episodes of nausea or vomiting. His diet has been advanced to soft diet and remains on pantoprazole 40 mg every 12 hours along with sucralfate 1 g suspension 3 times a day with meals and bedtime.  Objective: Vital signs in last 24 hours: Temp:  [98.7 F (37.1 C)-99.3 F (37.4 C)] 99.3 F (37.4 C) (08/20 0618) Pulse Rate:  [86-106] 106 (08/20 0618) Resp:  [11-22] 16 (08/20 0618) BP: (133-160)/(88-103) 133/103 (08/20 0847) SpO2:  [98 %-100 %] 99 % (08/20 0618) Weight change:  Last BM Date: (PTA)  PE:not in acute distress GENERAL: no pallor ABDOMEN:, nondistended, nontender, bowel sounds normal EXTREMITIES:no deformity, no edema  Lab Results: Results for orders placed or performed during the hospital encounter of 12/14/17 (from the past 48 hour(s))  Glucose, capillary     Status: Abnormal   Collection Time: 12/15/17 11:39 AM  Result Value Ref Range   Glucose-Capillary 203 (H) 70 - 99 mg/dL  Glucose, capillary     Status: Abnormal   Collection Time: 12/15/17  5:03 PM  Result Value Ref Range   Glucose-Capillary 158 (H) 70 - 99 mg/dL  Glucose, capillary     Status: None   Collection Time: 12/15/17  9:10 PM  Result Value Ref Range   Glucose-Capillary 81 70 - 99 mg/dL  Glucose, capillary     Status: Abnormal   Collection Time: 12/15/17 10:26 PM  Result Value Ref Range   Glucose-Capillary 158 (H) 70 - 99 mg/dL   Comment 1 Repeat Test   Glucose, capillary     Status: Abnormal   Collection Time: 12/16/17  8:31 AM  Result Value Ref Range   Glucose-Capillary 172 (H) 70 - 99 mg/dL  CBC with Differential/Platelet     Status: Abnormal   Collection Time: 12/16/17  8:33 AM  Result Value Ref Range   WBC 11.5 (H) 4.0 - 10.5 K/uL   RBC 4.85 4.22 - 5.81 MIL/uL   Hemoglobin 12.9 (L) 13.0 - 17.0 g/dL   HCT 16.140.6 09.639.0 - 04.552.0 %   MCV 83.7  78.0 - 100.0 fL   MCH 26.6 26.0 - 34.0 pg   MCHC 31.8 30.0 - 36.0 g/dL   RDW 40.915.3 81.111.5 - 91.415.5 %   Platelets 340 150 - 400 K/uL   Neutrophils Relative % 67 %   Neutro Abs 7.6 1.7 - 7.7 K/uL   Lymphocytes Relative 25 %   Lymphs Abs 2.9 0.7 - 4.0 K/uL   Monocytes Relative 7 %   Monocytes Absolute 0.8 0.1 - 1.0 K/uL   Eosinophils Relative 1 %   Eosinophils Absolute 0.1 0.0 - 0.7 K/uL   Basophils Relative 0 %   Basophils Absolute 0.0 0.0 - 0.1 K/uL   Immature Granulocytes 0 %   Abs Immature Granulocytes 0.1 0.0 - 0.1 K/uL    Comment: Performed at [facility], [address] [place], [place] [address]  Glucose, capillary     Status: Abnormal   Collection Time: 12/16/17 12:55 PM  Result Value Ref Range   Glucose-Capillary 123 (H) 70 - 99 mg/dL  Glucose, capillary     Status: Abnormal   Collection Time: 12/16/17  4:51 PM  Result Value Ref Range   Glucose-Capillary 135 (H) 70 - 99 mg/dL  Glucose, capillary     Status: Abnormal   Collection Time: 12/16/17  9:30  PM  Result Value Ref Range   Glucose-Capillary 132 (H) 70 - 99 mg/dL  Glucose, capillary     Status: Abnormal   Collection Time: 12/17/17  7:58 AM  Result Value Ref Range   Glucose-Capillary 269 (H) 70 - 99 mg/dL    Studies/Results: No results found.  Medications: I have reviewed the patient's current medications.  Assessment: 1. Coffee-ground emesis related to esophagitis as per EGD from yesterday. Hemoglobin remained stable between 12.8/11.8/12.9, BUN drowning trending from 13 to 7,hemodynamically stable 2. Symptoms compatible with diabetic gastroparesis  Plan: Continue PPI twice a day and sucralfate for 1 week, plan to switch to PPI once a day and sucralfate twice a day for additional 2 weeks on discharge. Recommend good blood sugar control for symptoms of diabetic gastroparesis, discussed with patient regarding need to take small, frequent meals, low fiber low residue diet. GI will sign off. Please recall if  needed.   Kerin Salenrya Keidy Thurgood 12/17/2017, 10:57 AM   Pager [phone] If no answer or after 5 PM call [phone]

## 2017-12-17 NOTE — Progress Notes (Signed)
Inpatient Diabetes Program Recommendations  AACE/ADA: New Consensus Statement on Inpatient Glycemic Control (2015)  Target Ranges:  Prepandial:   less than 140 mg/dL      Peak postprandial:   less than 180 mg/dL (1-2 hours)      Critically ill patients:  140 - 180 mg/dL   Results for Lynne LeaderKING, Amedio O (MRN [id number]) as of 12/17/2017 08:19  Ref. Range 12/16/2017 08:31 12/16/2017 12:55 12/16/2017 16:51 12/16/2017 21:30 12/17/2017 07:58  Glucose-Capillary Latest Ref Range: 70 - 99 mg/dL 409172 (H) 811123 (H) 914135 (H) 132 (H) 269 (H)   Review of Glycemic Control  Inpatient Diabetes Program Recommendations:    Patient refused Levemir last night. Fasting glucose 269 mg/dl. This is also part of the reason his A1c is 10.9%. Will watch trends on current regimen.   Thanks,  Christena DeemShannon Dicy Smigel RN, MSN, BC-ADM Inpatient Diabetes Coordinator Team Pager [phone] (8a-5p)

## 2017-12-17 NOTE — Discharge Summary (Addendum)
Physician Discharge Summary  Danny Vargas:[id number] DOB: 02-Jan-1971 DOA: 12/14/2017  PCP: Teodoro Spray, MD  Admit date: 12/14/2017 Discharge date: 12/17/2017  Admitted From: Home Disposition:  Home  Discharge Condition:Stable CODE STATUS:FULL Diet recommendation:  Carb Modified   Brief/Interim Summary:  Patient is a 47 year old male with past medical history of type 1 diabetes mellitus, diabetic gastroparesis, diabetic neuropathy, CKD stage  1, hypertension, hyperlipidemia, marijuana abuse who presents to the emergency department with complaints of persistent nausea, vomiting and coffee-ground emesis.  He was hospitalized here recently on 12/01/2017 for similar problems and was discharged because a decline EGD.  Patient underwent upper GI endoscopy with finding of diffuse severe mucosal changes lateral inflammation, linear erosions and ulceration in the middle third and lower third of the esophagus.  His hemoglobin is stable currently. He has been cleared by GI for discharge.  Patient is stable for discharge today.  Following problems were addressed during his hospitalization:  Esophagitis: Patient underwent upper GI endoscopy with finding of diffuse severe mucosal changes lateral inflammation, linear erosions and ulceration in the middle third and lower third of the esophagus.  Started on Carafate.  We will continue PPI.  We recommend to continue soft diet for next 3 to 5 days.  Patient said he had a dark stool today most likely it was associated with previous bleeding from the esophageal side.  His hemoglobin is stable .  Stable for discharge. He will follow-up with gastroenterology as an outpatient.  Coffee-ground emesis/hematemesis: Currently resolved.  His H&H is stable.  Hemodynamically stable.  Diabetic gastroparesis/nausea/vomiting: Continue  PPI. Currently his nausea and vomiting have improved.  We have recommended him to take small volume, frequent, low residual fat diet.   He is to maintain good glycemic control.  Diabetes type 1: Was on insulin pump at home.  Follows with endocrinologist as an outpatient.  Diabetic coordinator was following .  Stage I CKD: Currently kidney function stable.   Hypertension: On lisinopril 5 mg which we will continue  Hyperlipidemia: Continue statin  Leukocytosis: Most likely reactive.  Improved.   Discharge Diagnoses:  Principal Problem:   Esophagitis Active Problems:   Diabetes (HCC)   Nausea & vomiting   Gastroparesis due to DM (HCC)   Hematemesis   HTN (hypertension)   HLD (hyperlipidemia)   Marijuana abuse    Discharge Instructions  Discharge Instructions    Diet Carb Modified   Complete by:  As directed    Discharge instructions   Complete by:  As directed    1) Take prescribed medications as instructed. 2) Follow up with your PCP in a week.  Do a CBC, BMP test during the follow-up. 3)Follow up with gastroenterology in 4 weeks.  Name and number the provider has been attached. 4) Take frequent, small volume, low residual fat diet. 5)Continue monitoring her blood sugars at home.  Continue your insulin and follow-up with your endocrinologist. 6)Take soft diet for next 3-5 days.   Increase activity slowly   Complete by:  As directed      Allergies as of 12/17/2017      Reactions   Peanut-containing Drug Products Itching, Rash   Not all peanuts trigger the allergic reaction of itching in the throat and ears      Medication List    TAKE these medications   lisinopril 5 MG tablet Commonly known as:  PRINIVIL,ZESTRIL Take 5 mg by mouth daily.   lovastatin 20 MG tablet Commonly known as:  MEVACOR Take 20  mg by mouth daily.   NOVOLOG 100 UNIT/ML injection Generic drug:  insulin aspart Inject 0.8 Units/hr into the skin See admin instructions. Use in insulin pump 0.6 units/hr plus boluses of 0-10 units for meals.   ondansetron 4 MG tablet Commonly known as:  ZOFRAN Take 4 mg by mouth every  4 (four) hours as needed for nausea.   pantoprazole 40 MG tablet Commonly known as:  PROTONIX Take 1 tablet (40 mg total) by mouth 2 (two) times daily. Take twice a day for 1 week then continue taking once a day   ROLAIDS ANTACID ULTRA STRENGTH PO Take 2 tablets by mouth as needed (heartburn & Nausea).   sucralfate 1 GM/10ML suspension Commonly known as:  CARAFATE Take 3 times a day for 1 week then continue 2 times a day for 2 weeks.      Follow-up Information    Teodoro Sprayrujillo, Jamie, MD. Schedule an appointment as soon as possible for a visit in 1 week(s).   Specialty:  Internal Medicine Contact information: [address] [place] [facility] [place] [address] [phone]        Graylin ShiverGanem, Salem F, MD. Schedule an appointment as soon as possible for a visit in 4 week(s).   Specialty:  Gastroenterology Contact information: [address] [place] [address] [phone]          Allergies  Allergen Reactions  . Peanut-Containing Drug Products Itching and Rash    Not all peanuts trigger the allergic reaction of itching in the throat and ears    Consultations: GI  Procedures/Studies:  No results found.    Subjective: Patient seen and examined the bedside this morning.  Hemodynamically stable.  His hemoglobin has been stable.  No new episodes of hematemesis.  He still complains of some esophageal discomfort but his overall status has improved.  Stable for discharge.Marland Kitchen.  Discharge Exam: Vitals:   12/17/17 0847 12/17/17 1300  BP: (!) 133/103 110/72  Pulse:  (!) 109  Resp:  16  Temp:  98.9 F (37.2 C)  SpO2:  100%   Vitals:   12/16/17 2130 12/17/17 0618 12/17/17 0847 12/17/17 1300  BP: (!) 143/88 (!) 138/96 (!) 133/103 110/72  Pulse: 98 (!) 106  (!) 109  Resp: 16 16  16   Temp: 98.9 F (37.2 C) 99.3 F (37.4 C)  98.9 F (37.2 C)  TempSrc: Oral Oral  Oral  SpO2: 100% 99%  100%  Weight:      Height:        General: Pt is alert, awake, not in acute  distress Cardiovascular: RRR, S1/S2 +, no rubs, no gallops Respiratory: CTA bilaterally, no wheezing, no rhonchi Abdominal: Soft, NT, ND, bowel sounds + Extremities: no edema, no cyanosis    The results of significant diagnostics from this hospitalization (including imaging, microbiology, ancillary and laboratory) are listed below for reference.     Microbiology: No results found for this or any previous visit (from the past 240 hour(s)).   Labs: BNP (last 3 results) No results for input(s): BNP in the last 8760 hours. Basic Metabolic Panel: Recent Labs  Lab 12/14/17 0706 12/15/17 0439  NA 145 143  K 4.0 3.8  CL 109 110  CO2 18* 25  GLUCOSE 342* 257*  BUN 13 7  CREATININE 1.37* 1.02  CALCIUM 8.8* 8.8*   Liver Function Tests: Recent Labs  Lab 12/14/17 0706  AST 20  ALT 20  ALKPHOS 124  BILITOT 1.2  PROT 6.5  ALBUMIN 3.4*  No results for input(s): LIPASE, AMYLASE in the last 168 hours. No results for input(s): AMMONIA in the last 168 hours. CBC: Recent Labs  Lab 12/14/17 0706 12/15/17 0439 12/16/17 0833  WBC 17.1* 16.0* 11.5*  NEUTROABS  --   --  7.6  HGB 12.8* 11.8* 12.9*  HCT 41.1 37.5* 40.6  MCV 85.4 83.9 83.7  PLT 409* 348 340   Cardiac Enzymes: No results for input(s): CKTOTAL, CKMB, CKMBINDEX, TROPONINI in the last 168 hours. BNP: Invalid input(s): POCBNP CBG: Recent Labs  Lab 12/16/17 1255 12/16/17 1651 12/16/17 2130 12/17/17 0758 12/17/17 1141  GLUCAP 123* 135* 132* 269* 250*   D-Dimer No results for input(s): DDIMER in the last 72 hours. Hgb A1c No results for input(s): HGBA1C in the last 72 hours. Lipid Profile No results for input(s): CHOL, HDL, LDLCALC, TRIG, CHOLHDL, LDLDIRECT in the last 72 hours. Thyroid function studies No results for input(s): TSH, T4TOTAL, T3FREE, THYROIDAB in the last 72 hours.  Invalid input(s): FREET3 Anemia work up No results for input(s): VITAMINB12, FOLATE, FERRITIN, TIBC, IRON, RETICCTPCT in the  last 72 hours. Urinalysis    Component Value Date/Time   COLORURINE YELLOW 02/04/2014 0125   APPEARANCEUR CLEAR 02/04/2014 0125   LABSPEC 1.010 02/04/2014 0125   PHURINE 7.5 02/04/2014 0125   GLUCOSEU >1000 (A) 02/04/2014 0125   HGBUR NEGATIVE 02/04/2014 0125   BILIRUBINUR NEGATIVE 02/04/2014 0125   KETONESUR 40 (A) 02/04/2014 0125   PROTEINUR NEGATIVE 02/04/2014 0125   UROBILINOGEN 0.2 02/04/2014 0125   NITRITE NEGATIVE 02/04/2014 0125   LEUKOCYTESUR NEGATIVE 02/04/2014 0125   Sepsis Labs Invalid input(s): PROCALCITONIN,  WBC,  LACTICIDVEN Microbiology No results found for this or any previous visit (from the past 240 hour(s)).  Please note: You were cared for by a hospitalist during your hospital stay. Once you are discharged, your primary care physician will handle any further medical issues. Please note that NO REFILLS for any discharge medications will be authorized once you are discharged, as it is imperative that you return to your primary care physician (or establish a relationship with a primary care physician if you do not have one) for your post hospital discharge needs so that they can reassess your need for medications and monitor your lab values.    Time coordinating discharge: 40 minutes  SIGNED:   Burnadette PopAmrit Quinlan Mcfall, MD  [facility] 12/17/2017, 2:24 PM Pager [phone]  If 7PM-7AM, please contact night-coverage [email or web address] Password [email or web address]

## 2017-12-17 NOTE — Progress Notes (Signed)
CBG=132 mg/dL at bedtime. Patient refused his Levemir  Insulin 10 units. Stated that number wasn't enough for him to receive the levemir. Education given to patient on the importance for receiving long acting insulin. Will continue to monitor.

## 2020-08-18 ENCOUNTER — Other Ambulatory Visit: Payer: Self-pay

## 2020-08-18 ENCOUNTER — Emergency Department (HOSPITAL_COMMUNITY): Payer: BC Managed Care – PPO

## 2020-08-18 ENCOUNTER — Encounter (HOSPITAL_COMMUNITY): Payer: Self-pay | Admitting: *Deleted

## 2020-08-18 ENCOUNTER — Observation Stay (HOSPITAL_COMMUNITY): Payer: BC Managed Care – PPO

## 2020-08-18 ENCOUNTER — Inpatient Hospital Stay (HOSPITAL_COMMUNITY)
Admission: EM | Admit: 2020-08-18 | Discharge: 2020-08-22 | DRG: 074 | Disposition: A | Discharge status: Home / Self Care | Payer: BC Managed Care – PPO | Attending: Internal Medicine | Admitting: Internal Medicine

## 2020-08-18 DIAGNOSIS — K219 Gastro-esophageal reflux disease without esophagitis: Secondary | ICD-10-CM | POA: Diagnosis present

## 2020-08-18 DIAGNOSIS — E785 Hyperlipidemia, unspecified: Secondary | ICD-10-CM | POA: Diagnosis present

## 2020-08-18 DIAGNOSIS — F17210 Nicotine dependence, cigarettes, uncomplicated: Secondary | ICD-10-CM | POA: Diagnosis present

## 2020-08-18 DIAGNOSIS — E860 Dehydration: Secondary | ICD-10-CM | POA: Diagnosis present

## 2020-08-18 DIAGNOSIS — I10 Essential (primary) hypertension: Secondary | ICD-10-CM | POA: Diagnosis present

## 2020-08-18 DIAGNOSIS — R1110 Vomiting, unspecified: Secondary | ICD-10-CM | POA: Diagnosis present

## 2020-08-18 DIAGNOSIS — E869 Volume depletion, unspecified: Secondary | ICD-10-CM | POA: Diagnosis present

## 2020-08-18 DIAGNOSIS — Z91010 Allergy to peanuts: Secondary | ICD-10-CM

## 2020-08-18 DIAGNOSIS — Z20822 Contact with and (suspected) exposure to covid-19: Secondary | ICD-10-CM | POA: Diagnosis present

## 2020-08-18 DIAGNOSIS — K3184 Gastroparesis: Secondary | ICD-10-CM | POA: Diagnosis present

## 2020-08-18 DIAGNOSIS — E1143 Type 2 diabetes mellitus with diabetic autonomic (poly)neuropathy: Principal | ICD-10-CM | POA: Diagnosis present

## 2020-08-18 DIAGNOSIS — N179 Acute kidney failure, unspecified: Secondary | ICD-10-CM

## 2020-08-18 DIAGNOSIS — Z833 Family history of diabetes mellitus: Secondary | ICD-10-CM

## 2020-08-18 DIAGNOSIS — E119 Type 2 diabetes mellitus without complications: Secondary | ICD-10-CM

## 2020-08-18 DIAGNOSIS — Z794 Long term (current) use of insulin: Secondary | ICD-10-CM

## 2020-08-18 DIAGNOSIS — F12188 Cannabis abuse with other cannabis-induced disorder: Secondary | ICD-10-CM

## 2020-08-18 DIAGNOSIS — D72829 Elevated white blood cell count, unspecified: Secondary | ICD-10-CM | POA: Diagnosis present

## 2020-08-18 DIAGNOSIS — E870 Hyperosmolality and hypernatremia: Secondary | ICD-10-CM | POA: Diagnosis present

## 2020-08-18 DIAGNOSIS — K920 Hematemesis: Secondary | ICD-10-CM | POA: Diagnosis present

## 2020-08-18 DIAGNOSIS — K922 Gastrointestinal hemorrhage, unspecified: Secondary | ICD-10-CM | POA: Diagnosis present

## 2020-08-18 DIAGNOSIS — F1210 Cannabis abuse, uncomplicated: Secondary | ICD-10-CM | POA: Diagnosis present

## 2020-08-18 DIAGNOSIS — E1165 Type 2 diabetes mellitus with hyperglycemia: Secondary | ICD-10-CM

## 2020-08-18 DIAGNOSIS — E11649 Type 2 diabetes mellitus with hypoglycemia without coma: Secondary | ICD-10-CM | POA: Diagnosis not present

## 2020-08-18 DIAGNOSIS — R112 Nausea with vomiting, unspecified: Secondary | ICD-10-CM | POA: Diagnosis present

## 2020-08-18 DIAGNOSIS — Z79899 Other long term (current) drug therapy: Secondary | ICD-10-CM

## 2020-08-18 LAB — CBC WITH DIFFERENTIAL/PLATELET
Abs Immature Granulocytes: 0.36 10*3/uL — ABNORMAL HIGH (ref 0.00–0.07)
Basophils Absolute: 0.1 10*3/uL (ref 0.0–0.1)
Basophils Relative: 0 %
Eosinophils Absolute: 0 10*3/uL (ref 0.0–0.5)
Eosinophils Relative: 0 %
HCT: 51.3 % (ref 39.0–52.0)
Hemoglobin: 16.4 g/dL (ref 13.0–17.0)
Immature Granulocytes: 1 %
Lymphocytes Relative: 5 %
Lymphs Abs: 1.8 10*3/uL (ref 0.7–4.0)
MCH: 27.9 pg (ref 26.0–34.0)
MCHC: 32 g/dL (ref 30.0–36.0)
MCV: 87.2 fL (ref 80.0–100.0)
Monocytes Absolute: 1.9 10*3/uL — ABNORMAL HIGH (ref 0.1–1.0)
Monocytes Relative: 6 %
Neutro Abs: 29.6 10*3/uL — ABNORMAL HIGH (ref 1.7–7.7)
Neutrophils Relative %: 88 %
Platelets: 261 10*3/uL (ref 150–400)
RBC: 5.88 MIL/uL — ABNORMAL HIGH (ref 4.22–5.81)
RDW: 16.5 % — ABNORMAL HIGH (ref 11.5–15.5)
WBC: 33.8 10*3/uL — ABNORMAL HIGH (ref 4.0–10.5)
nRBC: 0 % (ref 0.0–0.2)

## 2020-08-18 LAB — COMPREHENSIVE METABOLIC PANEL
ALT: 19 U/L (ref 0–44)
AST: 29 U/L (ref 15–41)
Albumin: 4.7 g/dL (ref 3.5–5.0)
Alkaline Phosphatase: 145 U/L — ABNORMAL HIGH (ref 38–126)
Anion gap: 15 (ref 5–15)
BUN: 33 mg/dL — ABNORMAL HIGH (ref 6–20)
CO2: 25 mmol/L (ref 22–32)
Calcium: 10.3 mg/dL (ref 8.9–10.3)
Chloride: 111 mmol/L (ref 98–111)
Creatinine, Ser: 1.58 mg/dL — ABNORMAL HIGH (ref 0.61–1.24)
GFR, Estimated: 53 mL/min — ABNORMAL LOW (ref >60)
Glucose, Bld: 139 mg/dL — ABNORMAL HIGH (ref 70–99)
Potassium: 4.3 mmol/L (ref 3.5–5.1)
Sodium: 151 mmol/L — ABNORMAL HIGH (ref 135–145)
Total Bilirubin: 0.5 mg/dL (ref 0.3–1.2)
Total Protein: 8.8 g/dL — ABNORMAL HIGH (ref 6.5–8.1)

## 2020-08-18 LAB — URINALYSIS, ROUTINE W REFLEX MICROSCOPIC
Bacteria, UA: NONE SEEN
Bilirubin Urine: NEGATIVE
Glucose, UA: >=500 mg/dL — AB
Ketones, ur: 20 mg/dL — AB
Leukocytes,Ua: NEGATIVE
Nitrite: NEGATIVE
Protein, ur: >=300 mg/dL — AB
Specific Gravity, Urine: 1.026 (ref 1.005–1.030)
pH: 5 (ref 5.0–8.0)

## 2020-08-18 LAB — RAPID URINE DRUG SCREEN, HOSP PERFORMED
Amphetamines: NOT DETECTED
Barbiturates: NOT DETECTED
Benzodiazepines: POSITIVE — AB
Cocaine: NOT DETECTED
Opiates: NOT DETECTED
Tetrahydrocannabinol: POSITIVE — AB

## 2020-08-18 LAB — RESP PANEL BY RT-PCR (FLU A&B, COVID) ARPGX2
Influenza A by PCR: NEGATIVE
Influenza B by PCR: NEGATIVE
SARS Coronavirus 2 by RT PCR: NEGATIVE

## 2020-08-18 LAB — HEMOGLOBIN AND HEMATOCRIT, BLOOD
HCT: 41.4 % (ref 39.0–52.0)
Hemoglobin: 13 g/dL (ref 13.0–17.0)

## 2020-08-18 LAB — LIPASE, BLOOD: Lipase: 22 U/L (ref 11–51)

## 2020-08-18 LAB — CBG MONITORING, ED
Glucose-Capillary: 131 mg/dL — ABNORMAL HIGH (ref 70–99)
Glucose-Capillary: 338 mg/dL — ABNORMAL HIGH (ref 70–99)

## 2020-08-18 LAB — MAGNESIUM: Magnesium: 2.2 mg/dL (ref 1.7–2.4)

## 2020-08-18 MED ORDER — SODIUM CHLORIDE 0.45 % IV SOLN: INTRAVENOUS | Status: DC

## 2020-08-18 MED ORDER — ONDANSETRON HCL 4 MG/2ML IJ SOLN
4.0000 mg | Freq: Four times a day (QID) | INTRAMUSCULAR | Status: DC | PRN
  Administered 2020-08-19 (×2): 4 mg via INTRAVENOUS
  Filled 2020-08-18 (×3): qty 2

## 2020-08-18 MED ORDER — MORPHINE SULFATE (PF) 2 MG/ML IV SOLN
2.0000 mg | Freq: Once | INTRAVENOUS | Status: AC
  Administered 2020-08-18: 2 mg via INTRAVENOUS
  Filled 2020-08-18: qty 1

## 2020-08-18 MED ORDER — LACTATED RINGERS IV BOLUS: 1000.0000 mL | Freq: Once | INTRAVENOUS | Status: DC

## 2020-08-18 MED ORDER — ACETAMINOPHEN 325 MG PO TABS
650.0000 mg | ORAL_TABLET | Freq: Four times a day (QID) | ORAL | Status: DC | PRN
  Administered 2020-08-20 – 2020-08-21 (×2): 650 mg via ORAL
  Filled 2020-08-18 (×2): qty 2

## 2020-08-18 MED ORDER — HALOPERIDOL LACTATE 5 MG/ML IJ SOLN
2.0000 mg | Freq: Once | INTRAMUSCULAR | Status: AC
  Administered 2020-08-18: 2 mg via INTRAVENOUS
  Filled 2020-08-18: qty 1

## 2020-08-18 MED ORDER — LACTATED RINGERS IV BOLUS
1000.0000 mL | Freq: Once | INTRAVENOUS | Status: AC
  Administered 2020-08-18: 1000 mL via INTRAVENOUS

## 2020-08-18 MED ORDER — SODIUM CHLORIDE 0.9 % IV BOLUS
1000.0000 mL | Freq: Once | INTRAVENOUS | Status: AC
  Administered 2020-08-18: 1000 mL via INTRAVENOUS

## 2020-08-18 MED ORDER — ACETAMINOPHEN 650 MG RE SUPP: 650.0000 mg | Freq: Four times a day (QID) | RECTAL | Status: DC | PRN

## 2020-08-18 MED ORDER — PANTOPRAZOLE SODIUM 40 MG IV SOLR
40.0000 mg | Freq: Two times a day (BID) | INTRAVENOUS | Status: DC
  Administered 2020-08-18 – 2020-08-22 (×7): 40 mg via INTRAVENOUS
  Filled 2020-08-18 (×7): qty 40

## 2020-08-18 MED ORDER — INSULIN ASPART 100 UNIT/ML ~~LOC~~ SOLN
0.0000 [IU] | SUBCUTANEOUS | Status: DC
  Administered 2020-08-18: 7 [IU] via SUBCUTANEOUS
  Administered 2020-08-19: 2 [IU] via SUBCUTANEOUS
  Administered 2020-08-19: 3 [IU] via SUBCUTANEOUS
  Administered 2020-08-19: 2 [IU] via SUBCUTANEOUS
  Administered 2020-08-19: 5 [IU] via SUBCUTANEOUS
  Administered 2020-08-19: 3 [IU] via SUBCUTANEOUS
  Administered 2020-08-19: 2 [IU] via SUBCUTANEOUS
  Administered 2020-08-20: 1 [IU] via SUBCUTANEOUS
  Administered 2020-08-20: 2 [IU] via SUBCUTANEOUS
  Filled 2020-08-18: qty 1

## 2020-08-18 MED ORDER — CAPSAICIN 0.025 % EX CREA
1.0000 "application " | TOPICAL_CREAM | Freq: Two times a day (BID) | CUTANEOUS | Status: DC
  Administered 2020-08-18: 1 via TOPICAL
  Filled 2020-08-18: qty 60

## 2020-08-18 MED ORDER — MORPHINE SULFATE (PF) 2 MG/ML IV SOLN
2.0000 mg | INTRAVENOUS | Status: DC | PRN
Start: 2020-08-18 — End: 2020-08-19
  Administered 2020-08-19 (×4): 2 mg via INTRAVENOUS
  Filled 2020-08-18 (×4): qty 1

## 2020-08-18 MED ORDER — ONDANSETRON HCL 4 MG/2ML IJ SOLN
4.0000 mg | Freq: Once | INTRAMUSCULAR | Status: AC
  Administered 2020-08-18: 4 mg via INTRAVENOUS
  Filled 2020-08-18: qty 2

## 2020-08-18 MED ORDER — ONDANSETRON HCL 4 MG/2ML IJ SOLN
INTRAMUSCULAR | Status: AC
  Administered 2020-08-18: 4 mg via INTRAVENOUS
  Filled 2020-08-18: qty 2

## 2020-08-18 MED ORDER — ONDANSETRON HCL 4 MG PO TABS: 4.0000 mg | ORAL_TABLET | Freq: Four times a day (QID) | ORAL | Status: DC | PRN

## 2020-08-18 MED ORDER — IOHEXOL 300 MG/ML  SOLN
100.0000 mL | Freq: Once | INTRAMUSCULAR | Status: AC | PRN
  Administered 2020-08-18: 75 mL via INTRAVENOUS

## 2020-08-18 MED ORDER — FENTANYL CITRATE (PF) 100 MCG/2ML IJ SOLN
100.0000 ug | INTRAMUSCULAR | Status: DC | PRN
Start: 2020-08-18 — End: 2020-08-18

## 2020-08-18 MED ORDER — ONDANSETRON HCL 4 MG/2ML IJ SOLN: 4.0000 mg | Freq: Once | INTRAMUSCULAR | Status: AC

## 2020-08-18 NOTE — H&P (Signed)
History and Physical    ALGIS LEHENBAUER VHQ:[id number] DOB: [birth year] DOA: 08/18/2020  PCP: Teodoro Spray, MD   Patient coming from: Home  I have personally briefly reviewed patient's old medical records in [facility]  Chief Complaint: Vomiting  HPI: Danny Vargas is a 50 y.o. male with medical history significant for diabetes mellitus, hypertension, gastroparesis, GI bleed, cannabis use. Patient presented to the ED with complaints of multiple episodes of vomiting daily over the past 3 days with basically no oral intake.  He also reports mid to lower abdominal pain.  Reports last bowel movement was 3 days ago before onset of symptoms.  He also used cannabis about ~ 3 days ago before onset of symptoms.  Present at bedside.  Patient has had similar symptoms in the past about 2 months ago. Patient reports that every time he vomited, his vomitus looked black.  No obvious blood.  He has not had black stools.  Patient went to [facility] yesterday, was given Zofran and other medications.  Per son at bedside imaging was not obtained.  ED Course: Temperature 97.7.  Heart rate 144> 127.  Blood pressure systolic 140-107.  WBC 33.8.  Sodium 151.  Creatinine 1.58. Port chest X-ray without acute abnormality.  2 L bolus given.  Hospitalist to admit.  Review of Systems: As per HPI all other systems reviewed and negative.  Past Medical History:  Diagnosis Date  . Diabetes mellitus   . GERD (gastroesophageal reflux disease)   . HLD (hyperlipidemia) 12/14/2017  . HTN (hypertension) 12/14/2017  . Marijuana abuse 12/14/2017    Past Surgical History:  Procedure Laterality Date  . ESOPHAGOGASTRODUODENOSCOPY (EGD) WITH PROPOFOL N/A 12/16/2017   Procedure: ESOPHAGOGASTRODUODENOSCOPY (EGD) WITH PROPOFOL;  Surgeon: Carman Ching, MD;  Location: [facility] ENDOSCOPY;  Service: Endoscopy;  Laterality: N/A;  . KNEE SURGERY       reports that he has been smoking. He has been smoking about 1.00 pack per  day. He has never used smokeless tobacco. He reports that he does not drink alcohol and does not use drugs.  Allergies  Allergen Reactions  . Peanut-Containing Drug Products Itching and Rash    Not all peanuts trigger the allergic reaction of itching in the throat and ears    Family History  Problem Relation Age of Onset  . Diabetes Mother     Prior to Admission medications   Medication Sig Start Date End Date Taking? Authorizing Provider  Ca Carbonate-Mag Hydroxide (ROLAIDS ANTACID ULTRA STRENGTH PO) Take 2 tablets by mouth as needed (heartburn & Nausea).    [provider]  insulin aspart (NOVOLOG) 100 UNIT/ML injection Inject 0.8 Units/hr into the skin See admin instructions. Use in insulin pump 0.6 units/hr plus boluses of 0-10 units for meals.    [provider]  lisinopril (PRINIVIL,ZESTRIL) 5 MG tablet Take 5 mg by mouth daily.    [provider]  lovastatin (MEVACOR) 20 MG tablet Take 20 mg by mouth daily.    [provider]  ondansetron (ZOFRAN) 4 MG tablet Take 4 mg by mouth every 4 (four) hours as needed for nausea. 03/14/16   [provider]  pantoprazole (PROTONIX) 40 MG tablet Take 1 tablet (40 mg total) by mouth 2 (two) times daily. Take twice a day for 1 week then continue taking once a day 12/17/17 01/16/18  Burnadette Pop, MD  sucralfate (CARAFATE) 1 GM/10ML suspension Take 3 times a day for 1 week then continue 2 times a day for  2 weeks. 12/17/17   Burnadette Pop, MD    Physical Exam: Vitals:   08/18/20 1358 08/18/20 1645  BP: (!) 146/128 (!) 144/89  Pulse: (!) 144 (!) 127  Resp: (!) 22 16  Temp: 97.7 F (36.5 C)   TempSrc: Oral   SpO2: 97% 99%  Weight: 68 kg   Height: 6\' 2"  (1.88 m)     Constitutional: Patient appears uncomfortable, writhing in pain, restless Vitals:   08/18/20 1358 08/18/20 1645  BP: (!) 146/128 (!) 144/89  Pulse: (!) 144 (!) 127  Resp: (!) 22 16  Temp: 97.7 F (36.5 C)   TempSrc: Oral    SpO2: 97% 99%  Weight: 68 kg   Height: 6\' 2"  (1.88 m)    Eyes: PERRL, lids and conjunctivae normal ENMT: Mucous membranes are moist.  Neck: normal, supple, no masses, no thyromegaly Respiratory: clear to auscultation bilaterally, no wheezing, no crackles. Normal respiratory effort. No accessory muscle use.  Cardiovascular: Regular rate and rhythm, no murmurs / rubs / gallops. No extremity edema. 2+ pedal pulses.   Abdomen: Abdomen flat, soft, no tenderness, no masses palpated. No hepatosplenomegaly.  Musculoskeletal: no clubbing / cyanosis. No joint deformity upper and lower extremities. Good ROM, no contractures. Normal muscle tone.  Skin: no rashes, lesions, ulcers. No induration Neurologic: No apparent cranial abnormality, moving extremities spontaneously. Psychiatric: Normal judgment and insight. Alert and oriented x 3. Normal mood.   Labs on Admission: I have personally reviewed following labs and imaging studies  CBC: Recent Labs  Lab 08/18/20 1454  WBC 33.8*  NEUTROABS 29.6*  HGB 16.4  HCT 51.3  MCV 87.2  PLT 261   Basic Metabolic Panel: Recent Labs  Lab 08/18/20 1454  NA 151*  K 4.3  CL 111  CO2 25  GLUCOSE 139*  BUN 33*  CREATININE 1.58*  CALCIUM 10.3   Liver Function Tests: Recent Labs  Lab 08/18/20 1454  AST 29  ALT 19  ALKPHOS 145*  BILITOT 0.5  PROT 8.8*  ALBUMIN 4.7   Recent Labs  Lab 08/18/20 1454  LIPASE 22   CBG: Recent Labs  Lab 08/18/20 1431  GLUCAP 131*    Radiological Exams on Admission: DG Chest Port 1 View  Result Date: 08/18/2020 CLINICAL DATA:  Vomiting, abdominal pain EXAM: PORTABLE CHEST 1 VIEW COMPARISON:  06/20/2020 FINDINGS: Heart and mediastinal contours are within normal limits. No focal opacities or effusions. No acute bony abnormality. IMPRESSION: No active disease. Electronically Signed   By: 08/20/2020 M.D.   On: 08/18/2020 15:03    EKG: Independently reviewed.  Sinus tachycardia rate 126.  QTc 446.  PVCs  present.  Apart from the QRS complexes that are more pronounced no significant change from prior EKG.  Assessment/Plan Principal Problem:   Intractable vomiting Active Problems:   Diabetes (HCC)   Upper GI bleed   Gastroparesis due to DM (HCC)   HTN (hypertension)   Marijuana abuse  Intractable vomiting- with tachycardia to 144, likely from dehydration versus other intra-abdominal pathology.  Also leukocytosis (mostly chronic).  Last bowel movement 3 days ago.  Benign abdomen without distention.  Normal lipase- 22.  Etiology cannabis induced hyperemesis versus gastroparesis. -Abdominal CT with contrast ordered, patient declined.  - 3 L bolus given, continue 1/2 N/s 125cc/hr X 20hrs - BMP, CBC in the morning -N.p.o. - Check magnesium  Leukocytosis-33.8, with neutrophil predominance today. Per Care Everywhere last checked 05/2020- WBC baseline 21-23. -Recommend outpatient follow-up with hematology  History of upper GI  bleed-reports hematemesis today.  Hemoglobin 16, likely hemoconcentration. Differential includes esophageal tears from vomiting vs oesophagitis.  Admission in 2019 with esophagitis and hematemesis, EGD showed diffuse severe mucosal changes and ulceration in the middle third and lower third of the esophagus. -IV Protonix 40 twice daily -Monitor hemoglobin closely -May need GI evaluation  Acute kidney injury creatinine 1.58.  Baseline about 1.1.  Likely prerenal. - Hydrate.    Marijuana abuse-actively using.  Last use just prior to onset of symptoms. -Emphasized quitting -Follow-up UDS  Hypertension-stable. -Hold lisinopril with possible AKI  Uncontrolled diabetes mellitus-random glucose 139.  A1c 10.2. - SSI- S   DVT prophylaxis: SCDS Code Status: Full code Family Communication: Son at bedside Disposition Plan: ~ 1- 2 days Consults called: None Admission status:Obs, tele   Onnie Boer MD [facility]  08/18/2020, 7:50 PM

## 2020-08-18 NOTE — ED Provider Notes (Signed)
[facility] EMERGENCY DEPARTMENT Provider Note   CSN: [id number] Arrival date & time: 08/18/20  1349     History Chief Complaint  Patient presents with  . Emesis    Danny Vargas is a 50 y.o. male.  HPI He complains of vomiting with upper abdominal pain, and coffee-ground-like material in the emesis, for several days.  Treated for the same yesterday at a outlying hospital, and discharged from their ED.  He has a history of gastroparesis as well as hyperemesis cannabinoid syndrome.  He denies fever.  He has not had a history of a liver disorder.  He has diabetes.  He is taking his usual medications, without relief.  There are no other known active modifying factors.    Past Medical History:  Diagnosis Date  . Diabetes mellitus   . GERD (gastroesophageal reflux disease)   . HLD (hyperlipidemia) 12/14/2017  . HTN (hypertension) 12/14/2017  . Marijuana abuse 12/14/2017    Patient Active Problem List   Diagnosis Date Noted  . Esophagitis 12/16/2017  . Hematemesis 12/14/2017  . HTN (hypertension) 12/14/2017  . HLD (hyperlipidemia) 12/14/2017  . Marijuana abuse 12/14/2017  . Upper GI bleed 12/01/2017  . Gastroparesis due to DM (HCC) 12/01/2017  . Leukocytosis 12/01/2017  . Diabetes (HCC) 10/28/2013  . GERD (gastroesophageal reflux disease) 10/28/2013  . Nausea & vomiting 10/28/2013  . DKA (diabetic ketoacidoses) (HCC) 10/28/2013    Past Surgical History:  Procedure Laterality Date  . ESOPHAGOGASTRODUODENOSCOPY (EGD) WITH PROPOFOL N/A 12/16/2017   Procedure: ESOPHAGOGASTRODUODENOSCOPY (EGD) WITH PROPOFOL;  Surgeon: Carman Ching, MD;  Location: [facility] ENDOSCOPY;  Service: Endoscopy;  Laterality: N/A;  . KNEE SURGERY         Family History  Problem Relation Age of Onset  . Diabetes Mother     Social History   Tobacco Use  . Smoking status: Current Every Day Smoker    Packs/day: 1.00  . Smokeless tobacco: Never Used  Vaping Use  . Vaping Use: Never used  Substance  Use Topics  . Alcohol use: No  . Drug use: No    Home Medications Prior to Admission medications   Medication Sig Start Date End Date Taking? Authorizing Provider  Ca Carbonate-Mag Hydroxide (ROLAIDS ANTACID ULTRA STRENGTH PO) Take 2 tablets by mouth as needed (heartburn & Nausea).    [provider]  insulin aspart (NOVOLOG) 100 UNIT/ML injection Inject 0.8 Units/hr into the skin See admin instructions. Use in insulin pump 0.6 units/hr plus boluses of 0-10 units for meals.    [provider]  lisinopril (PRINIVIL,ZESTRIL) 5 MG tablet Take 5 mg by mouth daily.    [provider]  lovastatin (MEVACOR) 20 MG tablet Take 20 mg by mouth daily.    [provider]  ondansetron (ZOFRAN) 4 MG tablet Take 4 mg by mouth every 4 (four) hours as needed for nausea. 03/14/16   [provider]  pantoprazole (PROTONIX) 40 MG tablet Take 1 tablet (40 mg total) by mouth 2 (two) times daily. Take twice a day for 1 week then continue taking once a day 12/17/17 01/16/18  Burnadette Pop, MD  sucralfate (CARAFATE) 1 GM/10ML suspension Take 3 times a day for 1 week then continue 2 times a day for 2 weeks. 12/17/17   Burnadette Pop, MD    Allergies    Peanut-containing drug products  Review of Systems   Review of Systems  All other systems reviewed and are negative.   Physical Exam Updated Vital Signs BP Marland Kitchen)  146/128 (BP Location: Right Arm) Comment: notified triage nurse  Pulse (!) 144   Temp 97.7 F (36.5 C) (Oral)   Resp (!) 22   Ht 6\' 2"  (1.88 m)   Wt 68 kg   SpO2 97%   BMI 19.26 kg/m   Physical Exam Vitals and nursing note reviewed.  Constitutional:      General: He is not in acute distress.    Appearance: He is well-developed. He is not ill-appearing, toxic-appearing or diaphoretic.  HENT:     Head: Normocephalic and atraumatic.     Right Ear: External ear normal.     Left Ear: External ear normal.     Mouth/Throat:     Mouth: Mucous membranes  are moist.     Pharynx: No oropharyngeal exudate or posterior oropharyngeal erythema.  Eyes:     Conjunctiva/sclera: Conjunctivae normal.     Pupils: Pupils are equal, round, and reactive to light.  Neck:     Trachea: Phonation normal.  Cardiovascular:     Rate and Rhythm: Normal rate and regular rhythm.     Heart sounds: Normal heart sounds.  Pulmonary:     Effort: Pulmonary effort is normal.     Breath sounds: Normal breath sounds.  Abdominal:     General: There is no distension.     Palpations: Abdomen is soft. There is no mass.     Tenderness: There is abdominal tenderness (Epigastric, mild). There is no guarding.     Hernia: No hernia is present.  Musculoskeletal:        General: Normal range of motion.     Cervical back: Normal range of motion and neck supple.  Skin:    General: Skin is warm and dry.  Neurological:     Mental Status: He is alert and oriented to person, place, and time.     Cranial Nerves: No cranial nerve deficit.     Sensory: No sensory deficit.     Motor: No abnormal muscle tone.     Coordination: Coordination normal.  Psychiatric:        Mood and Affect: Mood normal.        Behavior: Behavior normal.        Thought Content: Thought content normal.        Judgment: Judgment normal.     ED Results / Procedures / Treatments   Labs (all labs ordered are listed, but only abnormal results are displayed) Labs Reviewed  CBG MONITORING, ED - Abnormal; Notable for the following components:      Result Value   Glucose-Capillary 131 (*)    All other components within normal limits  RESP PANEL BY RT-PCR (FLU A&B, COVID) ARPGX2  URINALYSIS, ROUTINE W REFLEX MICROSCOPIC  RAPID URINE DRUG SCREEN, HOSP PERFORMED  COMPREHENSIVE METABOLIC PANEL  LIPASE, BLOOD  CBC WITH DIFFERENTIAL/PLATELET    EKG EKG Interpretation  Date/Time:  Thursday August 18 2020 14:30:53 EDT Ventricular Rate:  126 PR Interval:  108 QRS Duration: 83 QT Interval:  308 QTC  Calculation: 446 R Axis:   85 Text Interpretation: Sinus tachycardia Ventricular premature complex Probable left atrial enlargement Non-specific ST-t changes Baseline wander in lead(s) I II aVR since last tracing no significant change Confirmed by [id number] [phone]) on 08/18/2020 2:39:44 PM   Radiology No results found.  Procedures Procedures   Medications Ordered in ED Medications  sodium chloride 0.9 % bolus 1,000 mL (has no administration in time range)  ondansetron (ZOFRAN) injection 4 mg (has no  administration in time range)  capsaicin (ZOSTRIX) 0.025 % cream 1 application (has no administration in time range)  haloperidol lactate (HALDOL) injection 2 mg (has no administration in time range)  fentaNYL (SUBLIMAZE) injection 100 mcg (has no administration in time range)    ED Course  I have reviewed the triage vital signs and the nursing notes.  Pertinent labs & imaging results that were available during my care of the patient were reviewed by me and considered in my medical decision making (see chart for details).    MDM Rules/Calculators/A&P                           Patient Vitals for the past 24 hrs:  BP Temp Temp src Pulse Resp SpO2 Height Weight  08/18/20 1358 (!) 146/128 97.7 F (36.5 C) Oral (!) 144 (!) 22 97 % 6\' 2"  (1.88 m) 68 kg      Medical Decision Making:  This patient is presenting for evaluation of recurrent nausea vomiting abdominal pain, which does require a range of treatment options, and is a complaint that involves a high risk of morbidity and mortality. The differential diagnoses include gastroparesis, DKA, hypovolemia. I decided to review old records, and in summary middle-aged male presenting with signs and symptoms of persistent gastroparesis spite recent treatment.  He has history of DKA with similar processes.  He has significant tachycardia on arrival.  There is no immediate suspicion for acute infectious process.  SIRS negative.  I did not  require additional historical information from anyone.  Clinical Laboratory Tests Ordered, included CBC, Metabolic panel, Urinalysis and UDS, lipase, viral.Radiologic Tests Ordered, included chest x-ray.     Critical Interventions-clinical evaluation, laboratory testing, medication treatment, IV fluids, observation  After These Interventions, the Patient was reevaluated and was found with recurrence of symptoms.  Patient has a long-term diagnosis of diabetic gastroparesis.  He has frequent hospitalizations and admissions.  He is known to use marijuana, chronically.  Initial ED treatment today symptomatic, with medications and IV fluids.  Reassessment based on labs and progression of symptoms.  CRITICAL CARE-yes Performed by:  Nursing Notes Reviewed/ Care Coordinated Applicable Imaging Reviewed Interpretation of Laboratory Data incorporated into ED treatment   Plan-disposition by oncoming team following initial treatment regimen.    Final Clinical Impression(s) / ED Diagnoses Final diagnoses:  Acute kidney injury (nontraumatic) (HCC)  Diabetic gastroparesis (HCC)    Rx / DC Orders ED Discharge Orders    None       Mancel Bale, MD 08/19/20 [phone]

## 2020-08-18 NOTE — ED Provider Notes (Signed)
Care transferred to me.  The patient seems to be feeling a little better but he is still quite tachycardic in the 120 despite 1 L of fluids.  We will give a second.  He has evidence of an acute kidney injury.  He reports coffee-ground material in the emesis but his hemoglobin is 16.  While this might be hemoconcentrated I doubt he has significant GI bleeding.  While he has an impressive WBC, he denies fevers or abdominal pain.  My suspicion that this is from sepsis or acute infection is lower.  We will continue to give fluids and will admit to hospitalist service.   Pricilla Loveless, MD 08/18/20 [phone]

## 2020-08-18 NOTE — ED Triage Notes (Signed)
Vomiting with abdominal pain x 3 days, seen at [facility] yesterday

## 2020-08-18 NOTE — ED Notes (Signed)
Pt transported to CT. Received call from April in CT stating that pt had pulled his IV out and was refusing to let her put in another one and was refusing his scan as well. Pt brought back to room where I asked pt about this and he states he will let us  Replace IV but that he's "not going to have the scan done".

## 2020-08-18 NOTE — ED Notes (Signed)
Patient declined to sign due to present condition in triage

## 2020-08-19 ENCOUNTER — Other Ambulatory Visit: Payer: Self-pay

## 2020-08-19 ENCOUNTER — Observation Stay (HOSPITAL_COMMUNITY): Payer: BC Managed Care – PPO

## 2020-08-19 DIAGNOSIS — E1165 Type 2 diabetes mellitus with hyperglycemia: Secondary | ICD-10-CM | POA: Diagnosis present

## 2020-08-19 DIAGNOSIS — E870 Hyperosmolality and hypernatremia: Secondary | ICD-10-CM | POA: Diagnosis present

## 2020-08-19 DIAGNOSIS — F12188 Cannabis abuse with other cannabis-induced disorder: Secondary | ICD-10-CM | POA: Diagnosis not present

## 2020-08-19 DIAGNOSIS — R112 Nausea with vomiting, unspecified: Secondary | ICD-10-CM | POA: Diagnosis present

## 2020-08-19 DIAGNOSIS — F1210 Cannabis abuse, uncomplicated: Secondary | ICD-10-CM

## 2020-08-19 DIAGNOSIS — K3184 Gastroparesis: Secondary | ICD-10-CM

## 2020-08-19 DIAGNOSIS — Z20822 Contact with and (suspected) exposure to covid-19: Secondary | ICD-10-CM | POA: Diagnosis present

## 2020-08-19 DIAGNOSIS — F17210 Nicotine dependence, cigarettes, uncomplicated: Secondary | ICD-10-CM | POA: Diagnosis present

## 2020-08-19 DIAGNOSIS — Z79899 Other long term (current) drug therapy: Secondary | ICD-10-CM | POA: Diagnosis not present

## 2020-08-19 DIAGNOSIS — Z91010 Allergy to peanuts: Secondary | ICD-10-CM | POA: Diagnosis not present

## 2020-08-19 DIAGNOSIS — E1143 Type 2 diabetes mellitus with diabetic autonomic (poly)neuropathy: Secondary | ICD-10-CM | POA: Diagnosis present

## 2020-08-19 DIAGNOSIS — N179 Acute kidney failure, unspecified: Secondary | ICD-10-CM

## 2020-08-19 DIAGNOSIS — Z794 Long term (current) use of insulin: Secondary | ICD-10-CM | POA: Diagnosis not present

## 2020-08-19 DIAGNOSIS — E11649 Type 2 diabetes mellitus with hypoglycemia without coma: Secondary | ICD-10-CM | POA: Diagnosis not present

## 2020-08-19 DIAGNOSIS — E785 Hyperlipidemia, unspecified: Secondary | ICD-10-CM | POA: Diagnosis present

## 2020-08-19 DIAGNOSIS — I10 Essential (primary) hypertension: Secondary | ICD-10-CM | POA: Diagnosis present

## 2020-08-19 DIAGNOSIS — K920 Hematemesis: Secondary | ICD-10-CM | POA: Diagnosis present

## 2020-08-19 DIAGNOSIS — Z833 Family history of diabetes mellitus: Secondary | ICD-10-CM | POA: Diagnosis not present

## 2020-08-19 DIAGNOSIS — E869 Volume depletion, unspecified: Secondary | ICD-10-CM | POA: Diagnosis present

## 2020-08-19 DIAGNOSIS — E860 Dehydration: Secondary | ICD-10-CM | POA: Diagnosis present

## 2020-08-19 DIAGNOSIS — K219 Gastro-esophageal reflux disease without esophagitis: Secondary | ICD-10-CM | POA: Diagnosis present

## 2020-08-19 DIAGNOSIS — D72829 Elevated white blood cell count, unspecified: Secondary | ICD-10-CM | POA: Diagnosis present

## 2020-08-19 LAB — CBC
HCT: 40.3 % (ref 39.0–52.0)
Hemoglobin: 12.8 g/dL — ABNORMAL LOW (ref 13.0–17.0)
MCH: 27.8 pg (ref 26.0–34.0)
MCHC: 31.8 g/dL (ref 30.0–36.0)
MCV: 87.6 fL (ref 80.0–100.0)
Platelets: 280 10*3/uL (ref 150–400)
RBC: 4.6 MIL/uL (ref 4.22–5.81)
RDW: 15.6 % — ABNORMAL HIGH (ref 11.5–15.5)
WBC: 30.5 10*3/uL — ABNORMAL HIGH (ref 4.0–10.5)
nRBC: 0 % (ref 0.0–0.2)

## 2020-08-19 LAB — GLUCOSE, CAPILLARY
Glucose-Capillary: 169 mg/dL — ABNORMAL HIGH (ref 70–99)
Glucose-Capillary: 193 mg/dL — ABNORMAL HIGH (ref 70–99)
Glucose-Capillary: 198 mg/dL — ABNORMAL HIGH (ref 70–99)
Glucose-Capillary: 212 mg/dL — ABNORMAL HIGH (ref 70–99)
Glucose-Capillary: 221 mg/dL — ABNORMAL HIGH (ref 70–99)
Glucose-Capillary: 292 mg/dL — ABNORMAL HIGH (ref 70–99)

## 2020-08-19 LAB — BASIC METABOLIC PANEL
Anion gap: 9 (ref 5–15)
BUN: 25 mg/dL — ABNORMAL HIGH (ref 6–20)
CO2: 25 mmol/L (ref 22–32)
Calcium: 9.1 mg/dL (ref 8.9–10.3)
Chloride: 113 mmol/L — ABNORMAL HIGH (ref 98–111)
Creatinine, Ser: 1.24 mg/dL (ref 0.61–1.24)
GFR, Estimated: >60 mL/min (ref >60)
Glucose, Bld: 238 mg/dL — ABNORMAL HIGH (ref 70–99)
Potassium: 4.2 mmol/L (ref 3.5–5.1)
Sodium: 147 mmol/L — ABNORMAL HIGH (ref 135–145)

## 2020-08-19 LAB — LACTIC ACID, PLASMA: Lactic Acid, Venous: 1.3 mmol/L (ref 0.5–1.9)

## 2020-08-19 LAB — PROCALCITONIN: Procalcitonin: 0.84 ng/mL

## 2020-08-19 LAB — HIV ANTIBODY (ROUTINE TESTING W REFLEX): HIV Screen 4th Generation wRfx: NONREACTIVE

## 2020-08-19 LAB — HEMOGLOBIN A1C
Hgb A1c MFr Bld: 9.2 % — ABNORMAL HIGH (ref 4.8–5.6)
Mean Plasma Glucose: 217.34 mg/dL

## 2020-08-19 MED ORDER — POTASSIUM CHLORIDE IN NACL 20-0.45 MEQ/L-% IV SOLN: INTRAVENOUS | Status: DC

## 2020-08-19 MED ORDER — ONDANSETRON HCL 4 MG/2ML IJ SOLN
4.0000 mg | Freq: Four times a day (QID) | INTRAMUSCULAR | Status: DC
  Administered 2020-08-19 – 2020-08-22 (×12): 4 mg via INTRAVENOUS
  Filled 2020-08-19 (×13): qty 2

## 2020-08-19 MED ORDER — INSULIN GLARGINE 100 UNIT/ML ~~LOC~~ SOLN
10.0000 [IU] | Freq: Every day | SUBCUTANEOUS | Status: DC
  Administered 2020-08-19 – 2020-08-22 (×4): 10 [IU] via SUBCUTANEOUS
  Filled 2020-08-19 (×5): qty 0.1

## 2020-08-19 MED ORDER — METOCLOPRAMIDE HCL 5 MG/ML IJ SOLN
5.0000 mg | Freq: Once | INTRAMUSCULAR | Status: AC
  Administered 2020-08-19: 5 mg via INTRAVENOUS
  Filled 2020-08-19: qty 2

## 2020-08-19 MED ORDER — PROCHLORPERAZINE EDISYLATE 10 MG/2ML IJ SOLN
10.0000 mg | Freq: Once | INTRAMUSCULAR | Status: AC | PRN
  Administered 2020-08-19: 10 mg via INTRAVENOUS
  Filled 2020-08-19: qty 2

## 2020-08-19 MED ORDER — SODIUM CHLORIDE 0.9 % IV SOLN
12.5000 mg | Freq: Four times a day (QID) | INTRAVENOUS | Status: DC | PRN
  Administered 2020-08-19 – 2020-08-21 (×5): 12.5 mg via INTRAVENOUS
  Filled 2020-08-19 (×3): qty 0.5

## 2020-08-19 NOTE — Progress Notes (Signed)
Assumed pt care at 0700. A&O. Pt lying in bed in an uncomfortable state. Pt states that he is having severe nausea and is vomiting green bile in the trash can by his bed. Pt refused Zofran as he states that it does nothing for his nausea/vomiting. VSS. Pt tolerating ice chips.

## 2020-08-19 NOTE — Progress Notes (Signed)
Inpatient Diabetes Program Recommendations  AACE/ADA: New Consensus Statement on Inpatient Glycemic Control (2015)  Target Ranges:  Prepandial:   less than 140 mg/dL      Peak postprandial:   less than 180 mg/dL (1-2 hours)      Critically ill patients:  140 - 180 mg/dL   Lab Results  Component Value Date   GLUCAP 292 (H) 08/19/2020   HGBA1C 10.9 (H) 12/01/2017   Results for MATVEY, LLANAS (MRN [id number]) as of 08/19/2020 15:23  Ref. Range 08/18/2020 20:07 08/19/2020 00:09 08/19/2020 04:43 08/19/2020 07:38 08/19/2020 11:02  Glucose-Capillary Latest Ref Range: 70 - 99 mg/dL 408 (H) 144 (H) 818 (H) 169 (H) 292 (H)   Review of Glycemic Control  Diabetes history: type 1? Outpatient Diabetes medications: insulin pump Current orders for Inpatient glycemic control: Novolog SENSITIVE correction scale every 4 hours.  Inpatient Diabetes Program Recommendations:   Noted that blood sugars have started being greater than 180 mg/dl.  Recommend adding Lantus 10 units daily and continuing Novolog SENSITIVE correction scale every 4 hours if blood sugars continue to be elevated. Titrate dosages as needed.  Smith Mince RN BSN CDE Diabetes Coordinator Pager: [phone]  8am-5pm

## 2020-08-19 NOTE — Progress Notes (Signed)
Responded to nursing call:  Patient request to see physician Received secure chat from RN [phone] Arrive to bedside to see patient [phone]  Subjective: Patient arouses to his name being called.   When asked if he is vomiting--patient states "no, I'm just nauseous.  I did spit up some spit, but no retching or dry heaving." When asked how is his abd pain, he states "I'm not really paining, just upset stomach" Patient requested some nausea medication. Denies f/c, cp, sob, diarrhea, hematemesis.  Vitals:   08/19/20 0012 08/19/20 0426 08/19/20 0837 08/19/20 1502  BP: (!) 154/94 127/73 (!) 146/92 (!) 156/79  Pulse: (!) 110 (!) 104 (!) 118 (!) 107  Resp: 18 20 20 20   Temp: 98.1 F (36.7 C) 98.4 F (36.9 C) 98.4 F (36.9 C) 99.2 F (37.3 C)  TempSrc:   Oral   SpO2: 100% 98% 99% 98%  Weight:      Height:       CV--RRR Lung--diminished BS Abd--soft+BS/epigastric pain--mild--no rebound or guarding   Assessment/Plan: Intractable N/V -next scheduled zofran due 1800 -last phenergan at 1150 so he can have a dose now if he would prefer -reglan IV x 1 -minimize opioids  -am labs ordered     , DO [facility]

## 2020-08-19 NOTE — Progress Notes (Signed)
Notified MD about patient's labs ( elevated wbc) and continuing pain and nausea.  Explained to patient about the importance of having CT scan as it would better direct his care while he was in the hospital.  Patient agreed to try scan again, but at the writing of this note, he was very nauseated and not able to drink oral contrast.  Patient stated that he would notify nursing staff when he was able to drink contrast.  Patient does have a history of type I diabetes, and insulin pump is not connected at this time.Other vitals stable, will continue to monitor patient.

## 2020-08-19 NOTE — Progress Notes (Signed)
Pt continues to have N/V throughout the day despite scheduled/prn anitemetics and pain medication. Dr. Arbutus Leas notified of pt's current condition.  Pt has received Phenergan IVPB, zofran, and Morphine as scheduled and prn and he is still very nauseated, vomiting, and in pain. Please advise.  MD. Darden Amber.  my advice is for him to stop using cannabis  You just gave him morphine at 1421 which was 2 minutes ago. Lets give him some time  This Clinical research associate and another nurse, Selena Batten, RN, has witnessed this pt vomiting several times throughout the day and just now as we heard him vomiting and went into room to assess him and pt states "I would like to see my doctor". MD secure chatted at this time. No response noted at this time with MD. Pt sitting on side of bed with emesis bag. Call bell within reach. Will continue to monitor.

## 2020-08-19 NOTE — Progress Notes (Addendum)
Responded to nursing call:  Recurrent vomiting and uncontrolled abdominal pain   Subjective: I came to the bedside to evaluate the patient at 1425. Patient was asleep when I arrived.  He was laying prone.  He did not initially respond when I spoke his name x 2.  I woke him up by touching his shoulder.  He aroused with tactile stimuli only. Patient states he is nauseous but tells me he has not vomited since this morning.  He states his belly pain is better than yesterday and earlier this morning.  He states his abdominal pain is current controlled.  He denies f/c, cp, sob.  No BM since admission.     Vitals:   08/18/20 2300 08/19/20 0012 08/19/20 0426 08/19/20 0837  BP:  (!) 154/94 127/73 (!) 146/92  Pulse:  (!) 110 (!) 104 (!) 118  Resp: 15 18 20 20   Temp:  98.1 F (36.7 C) 98.4 F (36.9 C) 98.4 F (36.9 C)  TempSrc:    Oral  SpO2: 99% 100% 98% 99%  Weight:      Height:       CV--RRR Lung--diminished BS but CTA Abd--soft+BS/mild tenderness epigastric region;  No rebound, no guarding   Assessment/Plan: Intractable N/V- -due to combination of gastroparesis and cannabis hyperemesis syndrome -minimize opioids as much as possible as this will worsen his gastroparesis -continue zofran ATC -continue phenergan prn intractable vomiting -still awaiting CT abd results; lactic acid 1.3 -continue protonix IV bid -continue IVF -clear liquids for now, but will make npo if vomiting returns       , DO [facility]

## 2020-08-19 NOTE — Progress Notes (Signed)
PROGRESS NOTE  Danny Vargas DOB: 1970/10/26 DOA: 08/18/2020 PCP: Teodoro Spray, MD  Brief History:  50 year old male with a history of diabetes mellitus type 2, gastroparesis, cannabis use, hypertension, upper GI bleed, hyperlipidemia presenting with intractable nausea and vomiting for 3 days.  He states that some of the emesis looks like coffee grounds.  He denies any frank blood.  He continues to use cannabis several times per week for over 10 years.  He denies any alcohol or illicit drug use.  He denies any fevers, chills, headache, chest pain, shortness of breath, coughing, hemoptysis, diarrhea, dysuria, hematuria.  He has some mid abdominal pain.  He states that he has been compliant with his NovoLog sliding scale.  His last cannabis use was 3 days prior to admission.  The patient had an emergency department visit at [facility] on 08/17/2020.  He was treated symptomatically and released home.  In the emergency department, the patient was afebrile and hemodynamically stable with oxygen saturation 98-100% room air.  BMP showed a sodium 151, bicarbonate 25 and serum creatinine 1.58.  LFTs were unremarkable.  Lipase was 22.  WBC 33.8, hemoglobin 16.4, platelets 261,000.  Chest x-ray was negative.  Assessment/Plan: Intractable nausea and vomiting -Secondary to cannabis hyperemesis syndrome and diabetic gastroparesis -Patient continues to have dry heaving -Start Zofran around-the-clock -Continue IV fluids -Continue PPI twice daily -CT abdomen and pelvis--patient does not want to drink contrast  Coffee-ground emesis -Hemoglobin 16.4 on the day of admission -GI consult -PPI  Acute kidney injury -Secondary to volume depletion -Baseline creatinine 1.0-1.2 -Presented with serum creatinine 1.58 -Continue IV fluids  Hypernatremia -Continue half-normal saline  Leukocytosis -Suspect this is a leukemoid reaction -Review of his medical record shows the patient  has had chronic leukocytosis in the low 20 range -PCT -Lactic acid -UA negative for pyuria -Personally reviewed chest x-ray--no infiltrates or effusion  Uncontrolled diabetes mellitus type 2 with hyperglycemia -Repeat hemoglobin A1c -12/01/2017 hemoglobin A1c 10.9 -continue novolog sliding scale  Essential hypertension -Holding lisinopril secondary to AKI  Hyperlipidemia -Restart lovastatin once able to tolerate p.o.     Status is: Observation  The patient will require care spanning > 2 midnights and should be moved to inpatient because: IV treatments appropriate due to intensity of illness or inability to take PO  Dispo: The patient is from: Home              Anticipated d/c is to: Home              Patient currently is not medically stable to d/c.   Difficult to place patient No        Family Communication:  no Family at bedside  Consultants:  none  Code Status:  FULL   DVT Prophylaxis:  SCDs   Procedures: As Listed in Progress Note Above  Antibiotics: None     Subjective: Patient continues to have some dry heaving.  He has some upper abdominal pain.  He denies any headache, chest pain, shortness of breath, hemoptysis, hematemesis, diarrhea, dysuria, hematuria.  Objective: Vitals:   08/18/20 2200 08/18/20 2300 08/19/20 0012 08/19/20 0426  BP: (!) 142/85  (!) 154/94 127/73  Pulse: (!) 121  (!) 110 (!) 104  Resp: 18 15 18 20   Temp:   98.1 F (36.7 C) 98.4 F (36.9 C)  TempSrc:      SpO2: 99% 99% 100% 98%  Weight:  Height:        Intake/Output Summary (Last 24 hours) at 08/19/2020 0707 Last data filed at 08/19/2020 0600 Gross per 24 hour  Intake 2511.11 ml  Output 400 ml  Net 2111.11 ml   Weight change:  Exam:   General:  Pt is alert, follows commands appropriately, not in acute distress  HEENT: No icterus, No thrush, No neck mass, [place]/AT  Cardiovascular: RRR, S1/S2, no rubs, no gallops  Respiratory: Diminished breath sounds  bilateral.  Bibasilar rales.  No wheezing.  Abdomen: Soft/+BS, non tender, non distended, no guarding  Extremities: No edema, No lymphangitis, No petechiae, No rashes, no synovitis   Data Reviewed: I have personally reviewed following labs and imaging studies Basic Metabolic Panel: Recent Labs  Lab 08/18/20 1454 08/18/20 2103 08/19/20 0550  NA 151*  --  147*  K 4.3  --  4.2  CL 111  --  113*  CO2 25  --  25  GLUCOSE 139*  --  238*  BUN 33*  --  25*  CREATININE 1.58*  --  1.24  CALCIUM 10.3  --  9.1  MG  --  2.2  --    Liver Function Tests: Recent Labs  Lab 08/18/20 1454  AST 29  ALT 19  ALKPHOS 145*  BILITOT 0.5  PROT 8.8*  ALBUMIN 4.7   Recent Labs  Lab 08/18/20 1454  LIPASE 22   No results for input(s): AMMONIA in the last 168 hours. Coagulation Profile: No results for input(s): INR, PROTIME in the last 168 hours. CBC: Recent Labs  Lab 08/18/20 1454 08/18/20 2103 08/19/20 0550  WBC 33.8*  --  30.5*  NEUTROABS 29.6*  --   --   HGB 16.4 13.0 12.8*  HCT 51.3 41.4 40.3  MCV 87.2  --  87.6  PLT 261  --  280   Cardiac Enzymes: No results for input(s): CKTOTAL, CKMB, CKMBINDEX, TROPONINI in the last 168 hours. BNP: Invalid input(s): POCBNP CBG: Recent Labs  Lab 08/18/20 1431 08/18/20 2007 08/19/20 0009 08/19/20 0443  GLUCAP 131* 338* 193* 221*   HbA1C: No results for input(s): HGBA1C in the last 72 hours. Urine analysis:    Component Value Date/Time   COLORURINE YELLOW 08/18/2020 2000   APPEARANCEUR HAZY (A) 08/18/2020 2000   LABSPEC 1.026 08/18/2020 2000   PHURINE 5.0 08/18/2020 2000   GLUCOSEU >=500 (A) 08/18/2020 2000   HGBUR SMALL (A) 08/18/2020 2000   BILIRUBINUR NEGATIVE 08/18/2020 2000   KETONESUR 20 (A) 08/18/2020 2000   PROTEINUR >=300 (A) 08/18/2020 2000   UROBILINOGEN 0.2 02/04/2014 0125   NITRITE NEGATIVE 08/18/2020 2000   LEUKOCYTESUR NEGATIVE 08/18/2020 2000   Sepsis  Labs: @LABRCNTIP (procalcitonin:4,lacticidven:4) ) Recent Results (from the past 240 hour(s))  Resp Panel by RT-PCR (Flu A&B, Covid) Nasopharyngeal Swab     Status: None   Collection Time: 08/18/20  3:21 PM   Specimen: Nasopharyngeal Swab; Nasopharyngeal(NP) swabs in vial transport medium  Result Value Ref Range Status   SARS Coronavirus 2 by RT PCR NEGATIVE NEGATIVE Final    Comment: (NOTE) SARS-CoV-2 target nucleic acids are NOT DETECTED.  The SARS-CoV-2 RNA is generally detectable in upper respiratory specimens during the acute phase of infection. The lowest concentration of SARS-CoV-2 viral copies this assay can detect is 138 copies/mL. A negative result does not preclude SARS-Cov-2 infection and should not be used as the sole basis for treatment or other patient management decisions. A negative result may occur with  improper specimen collection/handling, submission of  specimen other than nasopharyngeal swab, presence of viral mutation(s) within the areas targeted by this assay, and inadequate number of viral copies(<138 copies/mL). A negative result must be combined with clinical observations, patient history, and epidemiological information. The expected result is Negative.  Fact Sheet for Patients:  BloggerCourse.com  Fact Sheet for Healthcare Providers:  SeriousBroker.it  This test is no t yet approved or cleared by the [place] FDA and  has been authorized for detection and/or diagnosis of SARS-CoV-2 by FDA under an Emergency Use Authorization (EUA). This EUA will remain  in effect (meaning this test can be used) for the duration of the COVID-19 declaration under Section 564(b)(1) of the Act, 21 U.S.C.section 360bbb-3(b)(1), unless the authorization is terminated  or revoked sooner.       Influenza A by PCR NEGATIVE NEGATIVE Final   Influenza B by PCR NEGATIVE NEGATIVE Final    Comment: (NOTE) The Xpert Xpress  SARS-CoV-2/FLU/RSV plus assay is intended as an aid in the diagnosis of influenza from Nasopharyngeal swab specimens and should not be used as a sole basis for treatment. Nasal washings and aspirates are unacceptable for Xpert Xpress SARS-CoV-2/FLU/RSV testing.  Fact Sheet for Patients: BloggerCourse.com  Fact Sheet for Healthcare Providers: SeriousBroker.it  This test is not yet approved or cleared by the [place] FDA and has been authorized for detection and/or diagnosis of SARS-CoV-2 by FDA under an Emergency Use Authorization (EUA). This EUA will remain in effect (meaning this test can be used) for the duration of the COVID-19 declaration under Section 564(b)(1) of the Act, 21 U.S.C. section 360bbb-3(b)(1), unless the authorization is terminated or revoked.  Performed at [facility], [address], [place], [place] [address]      Scheduled Meds: . insulin aspart  0-9 Units Subcutaneous Q4H  . pantoprazole (PROTONIX) IV  40 mg Intravenous Q12H   Continuous Infusions: . sodium chloride 125 mL/hr at 08/19/20 1093    Procedures/Studies: DG Chest Port 1 View  Result Date: 08/18/2020 CLINICAL DATA:  Vomiting, abdominal pain EXAM: PORTABLE CHEST 1 VIEW COMPARISON:  06/20/2020 FINDINGS: Heart and mediastinal contours are within normal limits. No focal opacities or effusions. No acute bony abnormality. IMPRESSION: No active disease. Electronically Signed   By: Charlett Nose M.D.   On: 08/18/2020 15:03    Catarina Hartshorn, DO  [facility]  If 7PM-7AM, please contact night-coverage [email or web address] Password [email or web address] 08/19/2020, 7:07 AM   LOS: 0 days

## 2020-08-20 LAB — CBC WITH DIFFERENTIAL/PLATELET
Abs Immature Granulocytes: 0.12 10*3/uL — ABNORMAL HIGH (ref 0.00–0.07)
Basophils Absolute: 0.1 10*3/uL (ref 0.0–0.1)
Basophils Relative: 0 %
Eosinophils Absolute: 0.1 10*3/uL (ref 0.0–0.5)
Eosinophils Relative: 0 %
HCT: 37 % — ABNORMAL LOW (ref 39.0–52.0)
Hemoglobin: 11.9 g/dL — ABNORMAL LOW (ref 13.0–17.0)
Immature Granulocytes: 1 %
Lymphocytes Relative: 19 %
Lymphs Abs: 3.5 10*3/uL (ref 0.7–4.0)
MCH: 28.3 pg (ref 26.0–34.0)
MCHC: 32.2 g/dL (ref 30.0–36.0)
MCV: 88.1 fL (ref 80.0–100.0)
Monocytes Absolute: 1.4 10*3/uL — ABNORMAL HIGH (ref 0.1–1.0)
Monocytes Relative: 7 %
Neutro Abs: 13.8 10*3/uL — ABNORMAL HIGH (ref 1.7–7.7)
Neutrophils Relative %: 73 %
Platelets: 255 10*3/uL (ref 150–400)
RBC: 4.2 MIL/uL — ABNORMAL LOW (ref 4.22–5.81)
RDW: 15.3 % (ref 11.5–15.5)
WBC: 18.9 10*3/uL — ABNORMAL HIGH (ref 4.0–10.5)
nRBC: 0 % (ref 0.0–0.2)

## 2020-08-20 LAB — GLUCOSE, CAPILLARY
Glucose-Capillary: 137 mg/dL — ABNORMAL HIGH (ref 70–99)
Glucose-Capillary: 175 mg/dL — ABNORMAL HIGH (ref 70–99)
Glucose-Capillary: 190 mg/dL — ABNORMAL HIGH (ref 70–99)
Glucose-Capillary: 206 mg/dL — ABNORMAL HIGH (ref 70–99)
Glucose-Capillary: 223 mg/dL — ABNORMAL HIGH (ref 70–99)
Glucose-Capillary: 235 mg/dL — ABNORMAL HIGH (ref 70–99)
Glucose-Capillary: 44 mg/dL — CL (ref 70–99)
Glucose-Capillary: 73 mg/dL (ref 70–99)
Glucose-Capillary: 89 mg/dL (ref 70–99)

## 2020-08-20 LAB — COMPREHENSIVE METABOLIC PANEL
ALT: 16 U/L (ref 0–44)
AST: 20 U/L (ref 15–41)
Albumin: 3.4 g/dL — ABNORMAL LOW (ref 3.5–5.0)
Alkaline Phosphatase: 105 U/L (ref 38–126)
Anion gap: 8 (ref 5–15)
BUN: 14 mg/dL (ref 6–20)
CO2: 26 mmol/L (ref 22–32)
Calcium: 8.9 mg/dL (ref 8.9–10.3)
Chloride: 111 mmol/L (ref 98–111)
Creatinine, Ser: 1.11 mg/dL (ref 0.61–1.24)
GFR, Estimated: >60 mL/min (ref >60)
Glucose, Bld: 135 mg/dL — ABNORMAL HIGH (ref 70–99)
Potassium: 3.9 mmol/L (ref 3.5–5.1)
Sodium: 145 mmol/L (ref 135–145)
Total Bilirubin: 0.9 mg/dL (ref 0.3–1.2)
Total Protein: 6.3 g/dL — ABNORMAL LOW (ref 6.5–8.1)

## 2020-08-20 LAB — MAGNESIUM: Magnesium: 2 mg/dL (ref 1.7–2.4)

## 2020-08-20 MED ORDER — INSULIN ASPART 100 UNIT/ML ~~LOC~~ SOLN
0.0000 [IU] | Freq: Three times a day (TID) | SUBCUTANEOUS | Status: DC
  Administered 2020-08-20: 3 [IU] via SUBCUTANEOUS
  Administered 2020-08-21: 2 [IU] via SUBCUTANEOUS
  Administered 2020-08-21 (×2): 3 [IU] via SUBCUTANEOUS
  Administered 2020-08-22: 5 [IU] via SUBCUTANEOUS
  Administered 2020-08-22 (×2): 3 [IU] via SUBCUTANEOUS

## 2020-08-20 MED ORDER — SUCRALFATE 1 GM/10ML PO SUSP: 1.0000 g | Freq: Three times a day (TID) | ORAL | Status: DC

## 2020-08-20 MED ORDER — DEXTROSE-NACL 5-0.9 % IV SOLN: INTRAVENOUS | Status: DC

## 2020-08-20 MED ORDER — DEXTROSE 50 % IV SOLN
INTRAVENOUS | Status: AC
  Administered 2020-08-20: 50 mL
  Filled 2020-08-20: qty 50

## 2020-08-20 MED ORDER — SUCRALFATE 1 GM/10ML PO SUSP
1.0000 g | Freq: Three times a day (TID) | ORAL | Status: DC
  Administered 2020-08-20 – 2020-08-22 (×10): 1 g via ORAL
  Filled 2020-08-20 (×10): qty 10

## 2020-08-20 NOTE — Progress Notes (Signed)
PROGRESS NOTE  Danny Vargas XOV:[id number] DOB: 06/29/1970 DOA: 08/18/2020 PCP: Teodoro Spray, MD  Brief History:  50 year old male with a history of diabetes mellitus type 2, gastroparesis, cannabis use, hypertension, upper GI bleed, hyperlipidemia presenting with intractable nausea and vomiting for 3 days.  He states that some of the emesis looks like coffee grounds.  He denies any frank blood.  He continues to use cannabis several times per week for over 10 years.  He denies any alcohol or illicit drug use.  He denies any fevers, chills, headache, chest pain, shortness of breath, coughing, hemoptysis, diarrhea, dysuria, hematuria.  He has some mid abdominal pain.  He states that he has been compliant with his NovoLog sliding scale.  His last cannabis use was 3 days prior to admission.  The patient had an emergency department visit at [facility] on 08/17/2020.  He was treated symptomatically and released home.  In the emergency department, the patient was afebrile and hemodynamically stable with oxygen saturation 98-100% room air.  BMP showed a sodium 151, bicarbonate 25 and serum creatinine 1.58.  LFTs were unremarkable.  Lipase was 22.  WBC 33.8, hemoglobin 16.4, platelets 261,000.  Chest x-ray was negative.  08/20/20@1004 --Notifed by RN Basilio Cairo that patient continues to have nausea by secure chat.   RN requesting erythromycin/metoclopramide for patient. I went to evaluate patient at bedside at 1010.  Patient states abdominal pain is improving and nausea is his main issue.  Pt states he is occasionally "spitting up" but denies any vomiting, dry heaving or retching.  He states he is thirsty and wants to drink fluid.  RN updated face to face after my patient visit regarding my plan to start clears and reluctance to give continue erythromycin. Night shift documentation from Wilhemina Bonito RN reviewed from 08/20/20@ 0436--no vomiting, some nausea and spitting    Assessment/Plan: Intractable nausea and vomiting -Secondary to cannabis hyperemesis syndrome and diabetic gastroparesis -vomiting and dry heaving slowly improving -defer metoclopramide unless intractable vomiting recurs due to adverse event risks with this med -Continue Zofran around-the-clock -continue promethazine prn intractable n/v--has received 2 doses in last 24 hours with symptomatic improvement -Continue IV fluids -Continue PPI twice daily -08/19/20 CT abdomen and pelvis-no acute findings -add carafate  Coffee-ground emesis -Hemoglobin 16.4 on the day of admission -no coffee grounds emesis since admission -PPI -add carafate  Acute kidney injury -Secondary to volume depletion -Baseline creatinine 1.0-1.2 -Presented with serum creatinine 1.58 -Continue IV fluids-->improving  Hypernatremia -Continue half-normal saline-->improving  Leukocytosis -Suspect this is a leukemoid reaction -Review of his medical record shows the patient has had chronic leukocytosis in the upper teens, and low 20 range -PCT 0.84 -Lactic acid--1.3 -UA negative for pyuria -Personally reviewed chest x-ray--no infiltrates or effusion  Uncontrolled diabetes mellitus type 2 with hyperglycemia -08/19/20 hemoglobin A1c--9.2 -12/01/2017 hemoglobin A1c 10.9 -continue novolog sliding scale -lantus 10 units daily added  Essential hypertension -Holding lisinopril secondary to AKI -BP remains acceptable  Hyperlipidemia -Restart lovastatin once able to tolerate p.o.     Status is: Inpatient  The patient will require care spanning > 2 midnights and should be moved to inpatient because: IV treatments appropriate due to intensity of illness or inability to take PO  Dispo: The patient is from: Home  Anticipated d/c is to: Home  Patient currently is not medically stable to d/c.              Difficult to place patient  No        Family Communication:   no Family at bedside  Consultants:  none  Code Status:  FULL   DVT Prophylaxis:  SCDs   Procedures: As Listed in Progress Note Above  Antibiotics: None   Total time spent 35 minutes.  Greater than 50% spent face to face counseling and coordinating care.     Subjective: Patient denies fevers, chills, headache, chest pain, dyspnea, vomiting, diarrhea,dysuria, hematuria, hematochezia, and melena.   Objective: Vitals:   08/19/20 0837 08/19/20 1502 08/19/20 2101 08/20/20 0517  BP: (!) 146/92 (!) 156/79 (!) 141/71 138/79  Pulse: (!) 118 (!) 107 (!) 101 (!) 101  Resp: 20 20 20 20   Temp: 98.4 F (36.9 C) 99.2 F (37.3 C) 98.6 F (37 C) 98.8 F (37.1 C)  TempSrc: Oral  Oral Oral  SpO2: 99% 98% (!) 86% 100%  Weight:      Height:        Intake/Output Summary (Last 24 hours) at 08/20/2020 1106 Last data filed at 08/20/2020 1103 Gross per 24 hour  Intake 2440.35 ml  Output 700 ml  Net 1740.35 ml   Weight change:  Exam:   General:  Pt is alert, follows commands appropriately, not in acute distress  HEENT: No icterus, No thrush, No neck mass, [place]/AT  Cardiovascular: RRR, S1/S2, no rubs, no gallops  Respiratory: diminished BS.  No wheeze  Abdomen: Soft/+BS, mild epigastric tender, non distended, no guarding  Extremities: No edema, No lymphangitis, No petechiae, No rashes, no synovitis   Data Reviewed: I have personally reviewed following labs and imaging studies Basic Metabolic Panel: Recent Labs  Lab 08/18/20 1454 08/18/20 2103 08/19/20 0550 08/20/20 0455  NA 151*  --  147* 145  K 4.3  --  4.2 3.9  CL 111  --  113* 111  CO2 25  --  25 26  GLUCOSE 139*  --  238* 135*  BUN 33*  --  25* 14  CREATININE 1.58*  --  1.24 1.11  CALCIUM 10.3  --  9.1 8.9  MG  --  2.2  --  2.0   Liver Function Tests: Recent Labs  Lab 08/18/20 1454 08/20/20 0455  AST 29 20  ALT 19 16  ALKPHOS 145* 105  BILITOT 0.5 0.9  PROT 8.8* 6.3*  ALBUMIN 4.7 3.4*   Recent  Labs  Lab 08/18/20 1454  LIPASE 22   No results for input(s): AMMONIA in the last 168 hours. Coagulation Profile: No results for input(s): INR, PROTIME in the last 168 hours. CBC: Recent Labs  Lab 08/18/20 1454 08/18/20 2103 08/19/20 0550 08/20/20 0455  WBC 33.8*  --  30.5* 18.9*  NEUTROABS 29.6*  --   --  13.8*  HGB 16.4 13.0 12.8* 11.9*  HCT 51.3 41.4 40.3 37.0*  MCV 87.2  --  87.6 88.1  PLT 261  --  280 255   Cardiac Enzymes: No results for input(s): CKTOTAL, CKMB, CKMBINDEX, TROPONINI in the last 168 hours. BNP: Invalid input(s): POCBNP CBG: Recent Labs  Lab 08/19/20 2052 08/20/20 0028 08/20/20 0414 08/20/20 0741 08/20/20 1101  GLUCAP 212* 137* 89 175* 73   HbA1C: Recent Labs    08/19/20 0550  HGBA1C 9.2*   Urine analysis:    Component Value Date/Time   COLORURINE YELLOW 08/18/2020 2000   APPEARANCEUR HAZY (A) 08/18/2020 2000   LABSPEC 1.026 08/18/2020 2000   PHURINE 5.0 08/18/2020 2000   GLUCOSEU >=500 (A) 08/18/2020 2000   HGBUR SMALL (  A) 08/18/2020 2000   BILIRUBINUR NEGATIVE 08/18/2020 2000   KETONESUR 20 (A) 08/18/2020 2000   PROTEINUR >=300 (A) 08/18/2020 2000   UROBILINOGEN 0.2 02/04/2014 0125   NITRITE NEGATIVE 08/18/2020 2000   LEUKOCYTESUR NEGATIVE 08/18/2020 2000   Sepsis Labs: @LABRCNTIP (procalcitonin:4,lacticidven:4) ) Recent Results (from the past 240 hour(s))  Resp Panel by RT-PCR (Flu A&B, Covid) Nasopharyngeal Swab     Status: None   Collection Time: 08/18/20  3:21 PM   Specimen: Nasopharyngeal Swab; Nasopharyngeal(NP) swabs in vial transport medium  Result Value Ref Range Status   SARS Coronavirus 2 by RT PCR NEGATIVE NEGATIVE Final    Comment: (NOTE) SARS-CoV-2 target nucleic acids are NOT DETECTED.  The SARS-CoV-2 RNA is generally detectable in upper respiratory specimens during the acute phase of infection. The lowest concentration of SARS-CoV-2 viral copies this assay can detect is 138 copies/mL. A negative result does  not preclude SARS-Cov-2 infection and should not be used as the sole basis for treatment or other patient management decisions. A negative result may occur with  improper specimen collection/handling, submission of specimen other than nasopharyngeal swab, presence of viral mutation(s) within the areas targeted by this assay, and inadequate number of viral copies(<138 copies/mL). A negative result must be combined with clinical observations, patient history, and epidemiological information. The expected result is Negative.  Fact Sheet for Patients:  BloggerCourse.comhttps://www.fda.gov/media/152166/download  Fact Sheet for Healthcare Providers:  SeriousBroker.ithttps://www.fda.gov/media/152162/download  This test is no t yet approved or cleared by the [place] FDA and  has been authorized for detection and/or diagnosis of SARS-CoV-2 by FDA under an Emergency Use Authorization (EUA). This EUA will remain  in effect (meaning this test can be used) for the duration of the COVID-19 declaration under Section 564(b)(1) of the Act, 21 U.S.C.section 360bbb-3(b)(1), unless the authorization is terminated  or revoked sooner.       Influenza A by PCR NEGATIVE NEGATIVE Final   Influenza B by PCR NEGATIVE NEGATIVE Final    Comment: (NOTE) The Xpert Xpress SARS-CoV-2/FLU/RSV plus assay is intended as an aid in the diagnosis of influenza from Nasopharyngeal swab specimens and should not be used as a sole basis for treatment. Nasal washings and aspirates are unacceptable for Xpert Xpress SARS-CoV-2/FLU/RSV testing.  Fact Sheet for Patients: BloggerCourse.comhttps://www.fda.gov/media/152166/download  Fact Sheet for Healthcare Providers: SeriousBroker.ithttps://www.fda.gov/media/152162/download  This test is not yet approved or cleared by the [place] FDA and has been authorized for detection and/or diagnosis of SARS-CoV-2 by FDA under an Emergency Use Authorization (EUA). This EUA will remain in effect (meaning this test can be used) for the  duration of the COVID-19 declaration under Section 564(b)(1) of the Act, 21 U.S.C. section 360bbb-3(b)(1), unless the authorization is terminated or revoked.  Performed at [facility], [address] [place], [place] [id number]      Scheduled Meds: . insulin aspart  0-9 Units Subcutaneous Q4H  . insulin glargine  10 Units Subcutaneous Daily  . ondansetron (ZOFRAN) IV  4 mg Intravenous Q6H  . pantoprazole (PROTONIX) IV  40 mg Intravenous Q12H   Continuous Infusions: . 0.45 % NaCl with KCl 20 mEq / L 100 mL/hr at 08/20/20 1006  . promethazine (PHENERGAN) injection (IM or IVPB) Stopped (08/20/20 0443)    Procedures/Studies: CT ABDOMEN PELVIS W CONTRAST  Result Date: 08/19/2020 CLINICAL DATA:  Generalized abdominal pain for 3 days with intractable vomiting. History of diabetes and hypertension. EXAM: CT ABDOMEN AND PELVIS WITH CONTRAST TECHNIQUE: Multidetector CT imaging of the abdomen and pelvis was performed  using the standard protocol following bolus administration of intravenous contrast. CONTRAST:  17mL OMNIPAQUE IOHEXOL 300 MG/ML  SOLN COMPARISON:  Abdominopelvic CT 06/20/2020. FINDINGS: Lower chest: Clear lung bases. No significant pleural or pericardial effusion. Hepatobiliary: The liver is normal in density without suspicious focal abnormality. No evidence of gallstones, gallbladder wall thickening or biliary dilatation. Pancreas: Unremarkable. No pancreatic ductal dilatation or surrounding inflammatory changes. Spleen: Normal in size without focal abnormality. Adrenals/Urinary Tract: Both adrenal glands appear normal. There is a tiny nonobstructing calculus in the upper pole of the left kidney. No other definite urinary tract calculi are seen on this contrast enhanced study. There is no ureteral calculus, hydronephrosis or delayed contrast excretion. There is a tiny cyst in the upper interpolar region of the right kidney without additional renal abnormality. The bladder appears normal.  Stomach/Bowel: No enteric contrast administered. The stomach appears unremarkable for its degree of distension. No evidence of bowel wall thickening, distention or surrounding inflammatory change. The appendix appears normal. Vascular/Lymphatic: There are no enlarged abdominal or pelvic lymph nodes. Aortic and branch vessel atherosclerosis without acute vascular findings. Reproductive: The prostate gland and seminal vesicles appear normal. Other: Soft tissue stranding in the subcutaneous fat of the anterior abdominal wall attributed to subcutaneous injections. No hernia, ascites or free air. Musculoskeletal: No acute or significant osseous findings. IMPRESSION: 1. No acute abdominopelvic findings identified. 2. At least 1 visible nonobstructing left renal calculus. No evidence of ureteral calculus. 3.  Aortic Atherosclerosis (ICD10-I70.0). Electronically Signed   By: Carey Bullocks M.D.   On: 08/19/2020 09:37   DG Chest Port 1 View  Result Date: 08/18/2020 CLINICAL DATA:  Vomiting, abdominal pain EXAM: PORTABLE CHEST 1 VIEW COMPARISON:  06/20/2020 FINDINGS: Heart and mediastinal contours are within normal limits. No focal opacities or effusions. No acute bony abnormality. IMPRESSION: No active disease. Electronically Signed   By: Charlett Nose M.D.   On: 08/18/2020 15:03    Catarina Hartshorn, DO  [facility]  If 7PM-7AM, please contact night-coverage [email or web address] Password [email or web address] 08/20/2020, 11:06 AM   LOS: 1 day

## 2020-08-20 NOTE — Plan of Care (Signed)

## 2020-08-20 NOTE — Progress Notes (Addendum)
7:58 PM RN called due to patient's blood glucose being low at 44 x2 despite orange juice given.  IV D50 was given.  Apparently, patient has not been eating possibly due to intractable nausea and vomiting.  IV fluid was changed from 0.45 normal saline to D5 NS with closer monitoring CBGs. K+ is currently normal at 3.9, we shall continue to monitor closely and replenish for any abnormalities. Patient's CBG ranged in the 200s on D5 NS despite rate as low as 25 cc/h.  This was stopped and patient was restarted on initial IV drip.  We shall continue to monitor CBG.  Patient remained asymptomatic.

## 2020-08-20 NOTE — Treatment Plan (Signed)
Pt having nausea at this morning. States has not vomited, just severely nausea. MD tat made aware via secure chat. Pt educated that he is NPO and bowel rest is needed at this time. States he would like to try to relieve his symptoms at this time and just wants to get better. States that reglan helped some yesterday. MD messaged:   good morning, pt is still having nausea not vomiting since he is NPO. can he have scheduled reglan to help? I have seen patients at [facility] get erythromycin with gastroparesis to help with gastric emptying and helps decrease their nausea. Just thought I would throw that out there.

## 2020-08-20 NOTE — Progress Notes (Signed)
Tele d/c at this time . MD Tat stated face to face it was OK to d/c.

## 2020-08-20 NOTE — Treatment Plan (Signed)
Pt ambulating in halls at this time, no vomiting episodes this shift. Pt has been very active walking in halls since receiving noon dose of carafate and states it has helped and made a difference to where he was just alittle in pain after eating CLD tray. Pt educated about medication times and dose to be given. Pt has received scheduled medications as ordered. Denies pain at this time. Patient currently walking in halls at this time.

## 2020-08-20 NOTE — Progress Notes (Signed)
Pt was noticed to be drinking out of his water pitcher, and asking staff for drinks, despite being NPO.  After about 15 minutes of drinking out of his water pitcher, pt then called for this RN to provide him with some medication for his ABD pain and nausea.  This RN then placed a NPO sign on pt's door and informed support staff not to give pt food/drink.  Thus far in this RN's shift, pt has only spit in trash can. No vomiting seen.

## 2020-08-21 LAB — COMPREHENSIVE METABOLIC PANEL
ALT: 17 U/L (ref 0–44)
AST: 19 U/L (ref 15–41)
Albumin: 3.5 g/dL (ref 3.5–5.0)
Alkaline Phosphatase: 102 U/L (ref 38–126)
Anion gap: 9 (ref 5–15)
BUN: 7 mg/dL (ref 6–20)
CO2: 25 mmol/L (ref 22–32)
Calcium: 8.7 mg/dL — ABNORMAL LOW (ref 8.9–10.3)
Chloride: 102 mmol/L (ref 98–111)
Creatinine, Ser: 0.89 mg/dL (ref 0.61–1.24)
GFR, Estimated: >60 mL/min (ref >60)
Glucose, Bld: 233 mg/dL — ABNORMAL HIGH (ref 70–99)
Potassium: 3.7 mmol/L (ref 3.5–5.1)
Sodium: 136 mmol/L (ref 135–145)
Total Bilirubin: 1 mg/dL (ref 0.3–1.2)
Total Protein: 6.4 g/dL — ABNORMAL LOW (ref 6.5–8.1)

## 2020-08-21 LAB — CBC WITH DIFFERENTIAL/PLATELET
Abs Immature Granulocytes: 0.04 10*3/uL (ref 0.00–0.07)
Basophils Absolute: 0 10*3/uL (ref 0.0–0.1)
Basophils Relative: 0 %
Eosinophils Absolute: 0.1 10*3/uL (ref 0.0–0.5)
Eosinophils Relative: 1 %
HCT: 38.4 % — ABNORMAL LOW (ref 39.0–52.0)
Hemoglobin: 12.3 g/dL — ABNORMAL LOW (ref 13.0–17.0)
Immature Granulocytes: 0 %
Lymphocytes Relative: 19 %
Lymphs Abs: 2.5 10*3/uL (ref 0.7–4.0)
MCH: 27.8 pg (ref 26.0–34.0)
MCHC: 32 g/dL (ref 30.0–36.0)
MCV: 86.9 fL (ref 80.0–100.0)
Monocytes Absolute: 1.1 10*3/uL — ABNORMAL HIGH (ref 0.1–1.0)
Monocytes Relative: 8 %
Neutro Abs: 9.6 10*3/uL — ABNORMAL HIGH (ref 1.7–7.7)
Neutrophils Relative %: 72 %
Platelets: 229 10*3/uL (ref 150–400)
RBC: 4.42 MIL/uL (ref 4.22–5.81)
RDW: 14.6 % (ref 11.5–15.5)
WBC: 13.4 10*3/uL — ABNORMAL HIGH (ref 4.0–10.5)
nRBC: 0 % (ref 0.0–0.2)

## 2020-08-21 LAB — MAGNESIUM: Magnesium: 1.9 mg/dL (ref 1.7–2.4)

## 2020-08-21 LAB — GLUCOSE, CAPILLARY
Glucose-Capillary: 161 mg/dL — ABNORMAL HIGH (ref 70–99)
Glucose-Capillary: 170 mg/dL — ABNORMAL HIGH (ref 70–99)
Glucose-Capillary: 208 mg/dL — ABNORMAL HIGH (ref 70–99)
Glucose-Capillary: 208 mg/dL — ABNORMAL HIGH (ref 70–99)
Glucose-Capillary: 215 mg/dL — ABNORMAL HIGH (ref 70–99)
Glucose-Capillary: 219 mg/dL — ABNORMAL HIGH (ref 70–99)
Glucose-Capillary: 238 mg/dL — ABNORMAL HIGH (ref 70–99)

## 2020-08-21 MED ORDER — PROMETHAZINE HCL 25 MG/ML IJ SOLN
INTRAMUSCULAR | Status: AC
  Filled 2020-08-21: qty 1

## 2020-08-21 MED ORDER — POTASSIUM CHLORIDE IN NACL 20-0.45 MEQ/L-% IV SOLN: Freq: Once | INTRAVENOUS | Status: AC

## 2020-08-21 MED ORDER — POTASSIUM CHLORIDE IN NACL 20-0.9 MEQ/L-% IV SOLN: INTRAVENOUS | Status: DC

## 2020-08-21 NOTE — Progress Notes (Signed)
PROGRESS NOTE  Danny Vargas:[id number] DOB: 07-18-1970 DOA: 08/18/2020 PCP: Teodoro Spray, MD    Brief History: 50 year old male with a history of diabetes mellitus type 2, gastroparesis, cannabis use, hypertension, upper GI bleed, hyperlipidemia presenting with intractable nausea and vomiting for 3 days. He states that some of the emesis looks like coffee grounds. He denies any frank blood. He continues to use cannabis several times per week for over 10 years. He denies any alcohol or illicit drug use. He denies any fevers, chills, headache, chest pain, shortness of breath, coughing, hemoptysis, diarrhea, dysuria, hematuria. He has some mid abdominal pain. He states that he has been compliant with his NovoLog sliding scale. His last cannabis use was 3 days prior to admission. The patient had an emergency department [facility] at [facility] on [facility]/17/[facility]. He was treated symptomatically and released home. In the emergency department, the patient was afebrile and hemodynamically stable with oxygen saturation 98-100% room air. BMP showed a sodium 151, bicarbonate 25 and serum creatinine 1.58. LFTs were unremarkable. Lipase was 22. WBC 33.8, hemoglobin 16.4, platelets 261,000. Chest x-ray was negative.  08/20/20@1004 --Notifed by RN Basilio Cairo that patient continues to have nausea by secure chat.   RN requesting erythromycin/metoclopramide for patient. I went to evaluate patient at bedside at 1010.  Patient states abdominal pain is improving and nausea is his main issue.  Pt states he is occasionally "spitting up" but denies any vomiting, dry heaving or retching.  He states he is thirsty and wants to drink fluid.  RN updated face to face after my patient visit regarding my plan to start clears and reluctance to give continue erythromycin. Night shift documentation from Wilhemina Bonito RN reviewed from 08/20/20@ 0436--no vomiting, some nausea and spitting   08/21/20--abd  pain continues to improve.  No vomiting in last 24 hours.  Nausea slowly improving.  Pt requesting advancement in diet  Assessment/Plan: Intractable nausea and vomiting -Secondary to cannabis hyperemesis syndrome and diabetic gastroparesis -vomiting and dry heaving slowly improving -defer metoclopramide unless intractable vomiting recurs due to adverse event risks with this med -Continue Zofran around-the-clock -continue promethazine prn intractable n/v--has receiving 2 doses each 24 hours with symptomatic improvement -Continue IV fluids -Continue PPI twice daily -08/19/20 CT abdomen and pelvis-no acute findings -added carafate -08/21/20--advance to full liquids  Coffee-ground emesis -Hemoglobin 16.4 on the day of admission -no coffee grounds emesis since admission -PPI -continue carafate -drop from Hgb from diluation  Acute kidney injury -Secondary to volume depletion -Baseline creatinine 1.0-1.2 -Presented with serum creatinine 1.58 -Continue IV fluids-->improving  Hypernatremia -Continue half-normal saline-->improving  Leukocytosis -Suspect this is a leukemoid reaction -Review of his medical record shows the patient has had chronic leukocytosis in the upper teens, and low 20 range -PCT 0.84 -Lactic acid--1.3 -UA negative for pyuria -Personally reviewed chest x-ray--no infiltrates or effusion  Uncontrolled diabetes mellitus type 2 with hyperglycemia -08/19/20 hemoglobin A1c--9.2 -12/01/2017 hemoglobin A1c 10.9 -continue novolog sliding scale -lantus 10 units daily added -hypoglycemic episode on evening 4/23 due to sliding scale with decreased po intake;  Continue lantus  Essential hypertension -Holding lisinopril secondary to AKI -BP remains acceptable  Hyperlipidemia -Restart lovastatin once able to tolerate p.o.     Status is: Inpatient  The patient will require care spanning > 2 midnights and should be moved to inpatient because:IV treatments  appropriate due to intensity of illness or inability to take PO  Dispo: The patient is from:Home Anticipated  d/c is [facility]P Patient currently is not medically stable to d/c. Difficult to place patient No        Family Communication:noFamily at bedside  Consultants:none  Code Status: FULL   DVT Prophylaxis: SCDs   Procedures: As Listed in Progress Note Above  Antibiotics: None     Subjective: Patient states that abd pain continues to improve.  No further vomiting and nausea is slowly improving.  Denies f/c, cp, sob, diarrhea.  Objective: Vitals:   08/20/20 0517 08/20/20 1438 08/20/20 2030 08/21/20 0443  BP: 138/79 (!) 149/106 133/72 (!) 152/89  Pulse: (!) 101 (!) 102 97 88  Resp: 20 20 18 18   Temp: 98.8 F (37.1 C) 97.8 F (36.6 C) 99.1 F (37.3 C) 98.2 F (36.8 C)  TempSrc: Oral Oral Oral Oral  SpO2: 100% 100% 98% 100%  Weight:      Height:        Intake/Output Summary (Last 24 hours) at 08/21/2020 1514 Last data filed at 08/21/2020 1300 Gross per 24 hour  Intake 1597.96 ml  Output 600 ml  Net 997.96 ml   Weight change:  Exam:   General:  Pt is alert, follows commands appropriately, not in acute distress  HEENT: No icterus, No thrush, No neck mass, [place]/AT  Cardiovascular: RRR, S1/S2, no rubs, no gallops  Respiratory: CTA bilaterally, no wheezing, no crackles, no rhonchi  Abdomen: Soft/+BS, non tender, non distended, no guarding  Extremities: No edema, No lymphangitis, No petechiae, No rashes, no synovitis   Data Reviewed: I have personally reviewed following labs and imaging studies Basic Metabolic Panel: Recent Labs  Lab 08/18/20 1454 08/18/20 2103 08/19/20 0550 08/20/20 0455 08/21/20 0524  NA 151*  --  147* 145 136  K 4.3  --  4.2 3.9 3.7  CL 111  --  113* 111 102  CO2 25  --  25 26 25   GLUCOSE 139*  --  238* 135* 233*  BUN 33*  --  25* 14 7  CREATININE 1.58*  --   1.24 1.11 0.89  CALCIUM 10.3  --  9.1 8.9 8.7*  MG  --  2.2  --  2.0 1.9   Liver Function Tests: Recent Labs  Lab 08/18/20 1454 08/20/20 0455 08/21/20 0524  AST 29 20 19   ALT 19 16 17   ALKPHOS 145* 105 102  BILITOT 0.5 0.9 1.0  PROT 8.8* 6.3* 6.4*  ALBUMIN 4.7 3.4* 3.5   Recent Labs  Lab 08/18/20 1454  LIPASE 22   No results for input(s): AMMONIA in the last 168 hours. Coagulation Profile: No results for input(s): INR, PROTIME in the last 168 hours. CBC: Recent Labs  Lab 08/18/20 1454 08/18/20 2103 08/19/20 0550 08/20/20 0455 08/21/20 0524  WBC 33.8*  --  30.5* 18.9* 13.4*  NEUTROABS 29.6*  --   --  13.8* 9.6*  HGB 16.4 13.0 12.8* 11.9* 12.3*  HCT 51.3 41.4 40.3 37.0* 38.4*  MCV 87.2  --  87.6 88.1 86.9  PLT 261  --  280 255 229   Cardiac Enzymes: No results for input(s): CKTOTAL, CKMB, CKMBINDEX, TROPONINI in the last 168 hours. BNP: Invalid input(s): POCBNP CBG: Recent Labs  Lab 08/21/20 0014 08/21/20 0238 08/21/20 0444 08/21/20 0747 08/21/20 1149  GLUCAP 208* 208* 215* 219* 238*   HbA1C: Recent Labs    08/19/20 0550  HGBA1C 9.2*   Urine analysis:    Component Value Date/Time   COLORURINE YELLOW 08/18/2020 2000   APPEARANCEUR HAZY (A) 08/18/2020 2000  LABSPEC 1.026 08/18/2020 2000   PHURINE 5.0 08/18/2020 2000   GLUCOSEU >=500 (A) 08/18/2020 2000   HGBUR SMALL (A) 08/18/2020 2000   BILIRUBINUR NEGATIVE 08/18/2020 2000   KETONESUR 20 (A) 08/18/2020 2000   PROTEINUR >=300 (A) 08/18/2020 2000   UROBILINOGEN 0.2 02/04/2014 0125   NITRITE NEGATIVE 08/18/2020 2000   LEUKOCYTESUR NEGATIVE 08/18/2020 2000   Sepsis Labs: @LABRCNTIP (procalcitonin:4,lacticidven:4) ) Recent Results (from the past 240 hour(s))  Resp Panel by RT-PCR (Flu A&B, Covid) Nasopharyngeal Swab     Status: None   Collection Time: 08/18/20  3:21 PM   Specimen: Nasopharyngeal Swab; Nasopharyngeal(NP) swabs in vial transport medium  Result Value Ref Range Status   SARS  Coronavirus 2 by RT PCR NEGATIVE NEGATIVE Final    Comment: (NOTE) SARS-CoV-2 target nucleic acids are NOT DETECTED.  The SARS-CoV-2 RNA is generally detectable in upper respiratory specimens during the acute phase of infection. The lowest concentration of SARS-CoV-2 viral copies this assay can detect is 138 copies/mL. A negative result does not preclude SARS-Cov-2 infection and should not be used as the sole basis for treatment or other patient management decisions. A negative result may occur with  improper specimen collection/handling, submission of specimen other than nasopharyngeal swab, presence of viral mutation(s) within the areas targeted by this assay, and inadequate number of viral copies(<138 copies/mL). A negative result must be combined with clinical observations, patient history, and epidemiological information. The expected result is Negative.  Fact Sheet for Patients:  BloggerCourse.comhttps://www.fda.gov/media/152166/download  Fact Sheet for Healthcare Providers:  SeriousBroker.ithttps://www.fda.gov/media/152162/download  This test is no t yet approved or cleared by the [place] FDA and  has been authorized for detection and/or diagnosis of SARS-CoV-2 by FDA under an Emergency Use Authorization (EUA). This EUA will remain  in effect (meaning this test can be used) for the duration of the COVID-19 declaration under Section 564(b)(1) of the Act, 21 U.S.C.section 360bbb-3(b)(1), unless the authorization is terminated  or revoked sooner.       Influenza A by PCR NEGATIVE NEGATIVE Final   Influenza B by PCR NEGATIVE NEGATIVE Final    Comment: (NOTE) The Xpert Xpress SARS-CoV-2/FLU/RSV plus assay is intended as an aid in the diagnosis of influenza from Nasopharyngeal swab specimens and should not be used as a sole basis for treatment. Nasal washings and aspirates are unacceptable for Xpert Xpress SARS-CoV-2/FLU/RSV testing.  Fact Sheet for  Patients: BloggerCourse.comhttps://www.fda.gov/media/152166/download  Fact Sheet for Healthcare Providers: SeriousBroker.ithttps://www.fda.gov/media/152162/download  This test is not yet approved or cleared by the [place] FDA and has been authorized for detection and/or diagnosis of SARS-CoV-2 by FDA under an Emergency Use Authorization (EUA). This EUA will remain in effect (meaning this test can be used) for the duration of the COVID-19 declaration under Section 564(b)(1) of the Act, 21 U.S.C. section 360bbb-3(b)(1), unless the authorization is terminated or revoked.  Performed at [facility], [address] [place], [place] [address]      Scheduled Meds: . insulin aspart  0-9 Units Subcutaneous TID WC  . insulin glargine  10 Units Subcutaneous Daily  . ondansetron (ZOFRAN) IV  4 mg Intravenous Q6H  . pantoprazole (PROTONIX) IV  40 mg Intravenous Q12H  . sucralfate  1 g Oral TID WC & HS   Continuous Infusions: . promethazine (PHENERGAN) injection (IM or IVPB) 12.5 mg (08/21/20 0027)    Procedures/Studies: CT ABDOMEN PELVIS W CONTRAST  Result Date: 08/19/2020 CLINICAL DATA:  Generalized abdominal pain for 3 days with intractable vomiting. History of diabetes and hypertension.  EXAM: CT ABDOMEN AND PELVIS WITH CONTRAST TECHNIQUE: Multidetector CT imaging of the abdomen and pelvis was performed using the standard protocol following bolus administration of intravenous contrast. CONTRAST:  31mL OMNIPAQUE IOHEXOL 300 MG/ML  SOLN COMPARISON:  Abdominopelvic CT 06/20/2020. FINDINGS: Lower chest: Clear lung bases. No significant pleural or pericardial effusion. Hepatobiliary: The liver is normal in density without suspicious focal abnormality. No evidence of gallstones, gallbladder wall thickening or biliary dilatation. Pancreas: Unremarkable. No pancreatic ductal dilatation or surrounding inflammatory changes. Spleen: Normal in size without focal abnormality. Adrenals/Urinary Tract: Both adrenal glands appear normal.  There is a tiny nonobstructing calculus in the upper pole of the left kidney. No other definite urinary tract calculi are seen on this contrast enhanced study. There is no ureteral calculus, hydronephrosis or delayed contrast excretion. There is a tiny cyst in the upper interpolar region of the right kidney without additional renal abnormality. The bladder appears normal. Stomach/Bowel: No enteric contrast administered. The stomach appears unremarkable for its degree of distension. No evidence of bowel wall thickening, distention or surrounding inflammatory change. The appendix appears normal. Vascular/Lymphatic: There are no enlarged abdominal or pelvic lymph nodes. Aortic and branch vessel atherosclerosis without acute vascular findings. Reproductive: The prostate gland and seminal vesicles appear normal. Other: Soft tissue stranding in the subcutaneous fat of the anterior abdominal wall attributed to subcutaneous injections. No hernia, ascites or free air. Musculoskeletal: No acute or significant osseous findings. IMPRESSION: 1. No acute abdominopelvic findings identified. 2. At least 1 visible nonobstructing left renal calculus. No evidence of ureteral calculus. 3.  Aortic Atherosclerosis (ICD10-I70.0). Electronically Signed   By: Carey Bullocks M.D.   On: 08/19/2020 09:37   DG Chest Port 1 View  Result Date: 08/18/2020 CLINICAL DATA:  Vomiting, abdominal pain EXAM: PORTABLE CHEST 1 VIEW COMPARISON:  06/20/2020 FINDINGS: Heart and mediastinal contours are within normal limits. No focal opacities or effusions. No acute bony abnormality. IMPRESSION: No active disease. Electronically Signed   By: Charlett Nose M.D.   On: 08/18/2020 15:03    Catarina Hartshorn, DO  [facility]  If 7PM-7AM, please contact night-coverage [email or web address] Password [email or web address] 08/21/2020, 3:14 PM   LOS: 2 days

## 2020-08-22 LAB — CBC WITH DIFFERENTIAL/PLATELET
Abs Immature Granulocytes: 0.05 10*3/uL (ref 0.00–0.07)
Basophils Absolute: 0 10*3/uL (ref 0.0–0.1)
Basophils Relative: 0 %
Eosinophils Absolute: 0.2 10*3/uL (ref 0.0–0.5)
Eosinophils Relative: 2 %
HCT: 36.8 % — ABNORMAL LOW (ref 39.0–52.0)
Hemoglobin: 11.7 g/dL — ABNORMAL LOW (ref 13.0–17.0)
Immature Granulocytes: 0 %
Lymphocytes Relative: 21 %
Lymphs Abs: 2.4 10*3/uL (ref 0.7–4.0)
MCH: 27.9 pg (ref 26.0–34.0)
MCHC: 31.8 g/dL (ref 30.0–36.0)
MCV: 87.6 fL (ref 80.0–100.0)
Monocytes Absolute: 0.9 10*3/uL (ref 0.1–1.0)
Monocytes Relative: 8 %
Neutro Abs: 7.9 10*3/uL — ABNORMAL HIGH (ref 1.7–7.7)
Neutrophils Relative %: 69 %
Platelets: 217 10*3/uL (ref 150–400)
RBC: 4.2 MIL/uL — ABNORMAL LOW (ref 4.22–5.81)
RDW: 14.4 % (ref 11.5–15.5)
WBC: 11.5 10*3/uL — ABNORMAL HIGH (ref 4.0–10.5)
nRBC: 0 % (ref 0.0–0.2)

## 2020-08-22 LAB — MAGNESIUM: Magnesium: 1.8 mg/dL (ref 1.7–2.4)

## 2020-08-22 LAB — BASIC METABOLIC PANEL
Anion gap: 10 (ref 5–15)
BUN: 8 mg/dL (ref 6–20)
CO2: 23 mmol/L (ref 22–32)
Calcium: 8.8 mg/dL — ABNORMAL LOW (ref 8.9–10.3)
Chloride: 103 mmol/L (ref 98–111)
Creatinine, Ser: 0.91 mg/dL (ref 0.61–1.24)
GFR, Estimated: >60 mL/min (ref >60)
Glucose, Bld: 232 mg/dL — ABNORMAL HIGH (ref 70–99)
Potassium: 3.8 mmol/L (ref 3.5–5.1)
Sodium: 136 mmol/L (ref 135–145)

## 2020-08-22 LAB — GLUCOSE, CAPILLARY
Glucose-Capillary: 192 mg/dL — ABNORMAL HIGH (ref 70–99)
Glucose-Capillary: 208 mg/dL — ABNORMAL HIGH (ref 70–99)
Glucose-Capillary: 235 mg/dL — ABNORMAL HIGH (ref 70–99)
Glucose-Capillary: 245 mg/dL — ABNORMAL HIGH (ref 70–99)
Glucose-Capillary: 271 mg/dL — ABNORMAL HIGH (ref 70–99)

## 2020-08-22 MED ORDER — PANTOPRAZOLE SODIUM 40 MG PO TBEC: 40.0000 mg | DELAYED_RELEASE_TABLET | Freq: Two times a day (BID) | ORAL | Status: DC

## 2020-08-22 MED ORDER — SUCRALFATE 1 GM/10ML PO SUSP: 1.0000 g | Freq: Three times a day (TID) | ORAL | 1 refills | Status: DC

## 2020-08-22 MED ORDER — PANTOPRAZOLE SODIUM 40 MG PO TBEC: 40.0000 mg | DELAYED_RELEASE_TABLET | Freq: Two times a day (BID) | ORAL | 1 refills | Status: DC

## 2020-08-22 NOTE — Discharge Summary (Signed)
Physician Discharge Summary  Danny Vargas:[id number] DOB: 1971/03/03 DOA: 08/18/2020  PCP: Teodoro Spray, MD  Admit date: 08/18/2020 Discharge date: 08/22/2020  Admitted From: Home Disposition:  Home   Recommendations for Outpatient Follow-up:  1. Follow up with PCP in 1-2 weeks 2. Please obtain BMP/CBC in one week    Discharge Condition: Stable CODE STATUS: FULL Diet recommendation: Heart Healthy / Carb Modified   Brief/Interim Summary: 50 year old male with a history of diabetes mellitus type 2, gastroparesis, cannabis use, hypertension, upper GI bleed, hyperlipidemia presenting with intractable nausea and vomiting for 3 days. He states that some of the emesis looks like coffee grounds. He denies any frank blood. He continues to use cannabis several times per week for over 10 years. He denies any alcohol or illicit drug use. He denies any fevers, chills, headache, chest pain, shortness of breath, coughing, hemoptysis, diarrhea, dysuria, hematuria. He has some mid abdominal pain. He states that he has been compliant with his NovoLog sliding scale. His last cannabis use was 3 days prior to admission. The patient had an emergency department visit at [facility] on 08/17/2020. He was treated symptomatically and released home. In the emergency department, the patient was afebrile and hemodynamically stable with oxygen saturation 98-100% room air. BMP showed a sodium 151, bicarbonate 25 and serum creatinine 1.58. LFTs were unremarkable. Lipase was 22. WBC 33.8, hemoglobin 16.4, platelets 261,000. Chest x-ray was negative.  08/20/20@1004 --Notifed by RN Basilio Cairo that patient continues to have nausea by secure chat. RN requesting erythromycin/metoclopramide for patient. I went to evaluate patient at bedside at 1010. Patient states abdominal pain is improving and nausea is his main issue. Pt states he is occasionally "spitting up" but denies any vomiting, dry  heaving or retching. He states he is thirsty and wants to drink fluid. RN updated face to face after my patient visit regarding my plan to start clears and reluctance to give continue erythromycin. Night shift documentation from Wilhemina Bonito RN reviewed from 08/20/20@ 0436--no vomiting, some nausea and spitting  08/21/20--abd pain continues to improve.  No vomiting in last 24 hours.  Nausea slowly improving.  Pt requesting advancement in diet.  Full liquids started  08/22/20--no further vomiting.  Nausea gradually improving.  Pt requested diet advancement.  Soft diet started which patient tolerated.   Discharge Diagnoses:  Intractable nausea and vomiting/Cannabis Hyperemesis Syndrome -Secondary to cannabis hyperemesis syndrome and diabetic gastroparesis -vomiting and dry heaving slowly improving -defer metoclopramide unless intractable vomiting recurs due to adverse event risks with this med -ContinueZofran around-the-clock -continue promethazine prn intractable n/v--has receiving 2 doses each 24 hours with symptomatic improvement -Continue IV fluids -Continue PPI twice daily -4/22/22CT abdomen and pelvis-no acute findings -added carafate -08/21/20--advance to full liquids -08/22/20--advanced to soft diet which patient tolerated -d/c home with protonix bid and carafate ac/hs  Coffee-ground emesis -Hemoglobin 16.4 on the day of admission -no coffee grounds emesis since admission -PPI -continue carafate -drop from Hgb from dilution  Acute kidney injury -Secondary to volume depletion -Baseline creatinine 1.0-1.2 -Presented with serum creatinine 1.58 -Continue IV fluids-->improving -serum creatinine 0.91 on day of d/c  Hypernatremia -Continue half-normal saline-->improving  Leukocytosis -Suspect this is a leukemoid reaction -Review of his medical record shows the patient has had chronic leukocytosis in theupper teens, andlow 20 range -PCT0.84 -Lactic acid--1.3 -UA  negative for pyuria -Personally reviewed chest x-ray--no infiltrates or effusion -WBC 11.5 on day of d/c  Uncontrolled diabetes mellitus type 2 with hyperglycemia -4/22/22hemoglobin A1c--9.2 -12/01/2017 hemoglobin A1c 10.9 -  continue novolog sliding scale -lantus 10 units daily added -hypoglycemic episode on evening 4/23 due to sliding scale with decreased po intake;  Continue lantus  Essential hypertension -Holding lisinopril secondary to AKI -BP remains acceptable  Hyperlipidemia -Restart lovastatin once able to tolerate p.o.     Discharge Instructions   Allergies as of 08/22/2020      Reactions   Peanut-containing Drug Products Itching, Rash   Not all peanuts trigger the allergic reaction of itching in the throat and ears      Medication List    TAKE these medications   insulin aspart 100 UNIT/ML injection Commonly known as: novoLOG Inject 0.8 Units/hr into the skin See admin instructions. Use in insulin pump 0.6 units/hr plus boluses of 0-10 units for meals.   lisinopril 5 MG tablet Commonly known as: ZESTRIL Take 5 mg by mouth daily.   lovastatin 20 MG tablet Commonly known as: MEVACOR Take 20 mg by mouth daily.   pantoprazole 40 MG tablet Commonly known as: PROTONIX Take 1 tablet (40 mg total) by mouth 2 (two) times daily.   sucralfate 1 GM/10ML suspension Commonly known as: CARAFATE Take 10 mLs (1 g total) by mouth 4 (four) times daily -  with meals and at bedtime. What changed:   how much to take  how to take this  when to take this  additional instructions       Allergies  Allergen Reactions  . Peanut-Containing Drug Products Itching and Rash    Not all peanuts trigger the allergic reaction of itching in the throat and ears    Consultations:  none   Procedures/Studies: CT ABDOMEN PELVIS W CONTRAST  Result Date: 08/19/2020 CLINICAL DATA:  Generalized abdominal pain for 3 days with intractable vomiting. History of diabetes and  hypertension. EXAM: CT ABDOMEN AND PELVIS WITH CONTRAST TECHNIQUE: Multidetector CT imaging of the abdomen and pelvis was performed using the standard protocol following bolus administration of intravenous contrast. CONTRAST:  75mL OMNIPAQUE IOHEXOL 300 MG/ML  SOLN COMPARISON:  Abdominopelvic CT 06/20/2020. FINDINGS: Lower chest: Clear lung bases. No significant pleural or pericardial effusion. Hepatobiliary: The liver is normal in density without suspicious focal abnormality. No evidence of gallstones, gallbladder wall thickening or biliary dilatation. Pancreas: Unremarkable. No pancreatic ductal dilatation or surrounding inflammatory changes. Spleen: Normal in size without focal abnormality. Adrenals/Urinary Tract: Both adrenal glands appear normal. There is a tiny nonobstructing calculus in the upper pole of the left kidney. No other definite urinary tract calculi are seen on this contrast enhanced study. There is no ureteral calculus, hydronephrosis or delayed contrast excretion. There is a tiny cyst in the upper interpolar region of the right kidney without additional renal abnormality. The bladder appears normal. Stomach/Bowel: No enteric contrast administered. The stomach appears unremarkable for its degree of distension. No evidence of bowel wall thickening, distention or surrounding inflammatory change. The appendix appears normal. Vascular/Lymphatic: There are no enlarged abdominal or pelvic lymph nodes. Aortic and branch vessel atherosclerosis without acute vascular findings. Reproductive: The prostate gland and seminal vesicles appear normal. Other: Soft tissue stranding in the subcutaneous fat of the anterior abdominal wall attributed to subcutaneous injections. No hernia, ascites or free air. Musculoskeletal: No acute or significant osseous findings. IMPRESSION: 1. No acute abdominopelvic findings identified. 2. At least 1 visible nonobstructing left renal calculus. No evidence of ureteral calculus. 3.   Aortic Atherosclerosis (ICD10-I70.0). Electronically Signed   By: Carey Bullocks M.D.   On: 08/19/2020 09:37   DG Chest [facility] [address]  Result Date: 08/18/2020 CLINICAL DATA:  Vomiting, abdominal pain EXAM: PORTABLE CHEST 1 VIEW COMPARISON:  06/20/2020 FINDINGS: Heart and mediastinal contours are within normal limits. No focal opacities or effusions. No acute bony abnormality. IMPRESSION: No active disease. Electronically Signed   By: Charlett Nose M.D.   On: 08/18/2020 15:03         Discharge Exam: Vitals:   08/22/20 0444 08/22/20 1408  BP: (!) 144/80 112/83  Pulse: 93 99  Resp: 18 18  Temp: 98.6 F (37 C) 98.6 F (37 C)  SpO2: 100% 100%   Vitals:   08/21/20 1551 08/21/20 2047 08/22/20 0444 08/22/20 1408  BP: (!) 136/99 (!) 121/93 (!) 144/80 112/83  Pulse: 100 100 93 99  Resp: 18 18 18 18   Temp: 98.2 F (36.8 C) 98.3 F (36.8 C) 98.6 F (37 C) 98.6 F (37 C)  TempSrc: Oral Oral Oral Oral  SpO2: 100% 99% 100% 100%  Weight:   61.6 kg   Height:        General: Pt is alert, awake, not in acute distress Cardiovascular: RRR, S1/S2 +, no rubs, no gallops Respiratory: CTA bilaterally, no wheezing, no rhonchi Abdominal: Soft, mild epigastric tender, ND, bowel sounds +; no guarding Extremities: no edema, no cyanosis   The results of significant diagnostics from this hospitalization (including imaging, microbiology, ancillary and laboratory) are listed below for reference.    Significant Diagnostic Studies: CT ABDOMEN PELVIS W CONTRAST  Result Date: 08/19/2020 CLINICAL DATA:  Generalized abdominal pain for 3 days with intractable vomiting. History of diabetes and hypertension. EXAM: CT ABDOMEN AND PELVIS WITH CONTRAST TECHNIQUE: Multidetector CT imaging of the abdomen and pelvis was performed using the standard protocol following bolus administration of intravenous contrast. CONTRAST:  52mL OMNIPAQUE IOHEXOL 300 MG/ML  SOLN COMPARISON:  Abdominopelvic CT 06/20/2020. FINDINGS:  Lower chest: Clear lung bases. No significant pleural or pericardial effusion. Hepatobiliary: The liver is normal in density without suspicious focal abnormality. No evidence of gallstones, gallbladder wall thickening or biliary dilatation. Pancreas: Unremarkable. No pancreatic ductal dilatation or surrounding inflammatory changes. Spleen: Normal in size without focal abnormality. Adrenals/Urinary Tract: Both adrenal glands appear normal. There is a tiny nonobstructing calculus in the upper pole of the left kidney. No other definite urinary tract calculi are seen on this contrast enhanced study. There is no ureteral calculus, hydronephrosis or delayed contrast excretion. There is a tiny cyst in the upper interpolar region of the right kidney without additional renal abnormality. The bladder appears normal. Stomach/Bowel: No enteric contrast administered. The stomach appears unremarkable for its degree of distension. No evidence of bowel wall thickening, distention or surrounding inflammatory change. The appendix appears normal. Vascular/Lymphatic: There are no enlarged abdominal or pelvic lymph nodes. Aortic and branch vessel atherosclerosis without acute vascular findings. Reproductive: The prostate gland and seminal vesicles appear normal. Other: Soft tissue stranding in the subcutaneous fat of the anterior abdominal wall attributed to subcutaneous injections. No hernia, ascites or free air. Musculoskeletal: No acute or significant osseous findings. IMPRESSION: 1. No acute abdominopelvic findings identified. 2. At least 1 visible nonobstructing left renal calculus. No evidence of ureteral calculus. 3.  Aortic Atherosclerosis (ICD10-I70.0). Electronically Signed   By: 06/22/2020 M.D.   On: 08/19/2020 09:37   DG Chest Port 1 View  Result Date: 08/18/2020 CLINICAL DATA:  Vomiting, abdominal pain EXAM: PORTABLE CHEST 1 VIEW COMPARISON:  06/20/2020 FINDINGS: Heart and mediastinal contours are within normal  limits. No focal opacities or effusions. No acute bony abnormality.  IMPRESSION: No active disease. Electronically Signed   By: Charlett Nose M.D.   On: 08/18/2020 15:03     Microbiology: Recent Results (from the past 240 hour(s))  Resp Panel by RT-PCR (Flu A&B, Covid) Nasopharyngeal Swab     Status: None   Collection Time: 08/18/20  3:21 PM   Specimen: Nasopharyngeal Swab; Nasopharyngeal(NP) swabs in vial transport medium  Result Value Ref Range Status   SARS Coronavirus 2 by RT PCR NEGATIVE NEGATIVE Final    Comment: (NOTE) SARS-CoV-2 target nucleic acids are NOT DETECTED.  The SARS-CoV-2 RNA is generally detectable in upper respiratory specimens during the acute phase of infection. The lowest concentration of SARS-CoV-2 viral copies this assay can detect is 138 copies/mL. A negative result does not preclude SARS-Cov-2 infection and should not be used as the sole basis for treatment or other patient management decisions. A negative result may occur with  improper specimen collection/handling, submission of specimen other than nasopharyngeal swab, presence of viral mutation(s) within the areas targeted by this assay, and inadequate number of viral copies(<138 copies/mL). A negative result must be combined with clinical observations, patient history, and epidemiological information. The expected result is Negative.  Fact Sheet for Patients:  BloggerCourse.com  Fact Sheet for Healthcare Providers:  SeriousBroker.it  This test is no t yet approved or cleared by the [place] FDA and  has been authorized for detection and/or diagnosis of SARS-CoV-2 by FDA under an Emergency Use Authorization (EUA). This EUA will remain  in effect (meaning this test can be used) for the duration of the COVID-19 declaration under Section 564(b)(1) of the Act, 21 U.S.C.section 360bbb-3(b)(1), unless the authorization is terminated  or revoked sooner.        Influenza A by PCR NEGATIVE NEGATIVE Final   Influenza B by PCR NEGATIVE NEGATIVE Final    Comment: (NOTE) The Xpert Xpress SARS-CoV-2/FLU/RSV plus assay is intended as an aid in the diagnosis of influenza from Nasopharyngeal swab specimens and should not be used as a sole basis for treatment. Nasal washings and aspirates are unacceptable for Xpert Xpress SARS-CoV-2/FLU/RSV testing.  Fact Sheet for Patients: BloggerCourse.com  Fact Sheet for Healthcare Providers: SeriousBroker.it  This test is not yet approved or cleared by the [place] FDA and has been authorized for detection and/or diagnosis of SARS-CoV-2 by FDA under an Emergency Use Authorization (EUA). This EUA will remain in effect (meaning this test can be used) for the duration of the COVID-19 declaration under Section 564(b)(1) of the Act, 21 U.S.C. section 360bbb-3(b)(1), unless the authorization is terminated or revoked.  Performed at [facility], [address], [place], [place] [address]      Labs: Basic Metabolic Panel: Recent Labs  Lab 08/18/20 1454 08/18/20 2103 08/19/20 0550 08/20/20 0455 08/21/20 0524 08/22/20 0419  NA 151*  --  147* 145 136 136  K 4.3  --  4.2 3.9 3.7 3.8  CL 111  --  113* 111 102 103  CO2 25  --  25 26 25 23   GLUCOSE 139*  --  238* 135* 233* 232*  BUN 33*  --  25* 14 7 8   CREATININE 1.58*  --  1.24 1.11 0.89 0.91  CALCIUM 10.3  --  9.1 8.9 8.7* 8.8*  MG  --  2.2  --  2.0 1.9 1.8   Liver Function Tests: Recent Labs  Lab 08/18/20 1454 08/20/20 0455 08/21/20 0524  AST 29 20 19   ALT 19 16 17   ALKPHOS 145* 105 102  BILITOT 0.5 0.9 1.0  PROT 8.8* 6.3* 6.4*  ALBUMIN 4.7 3.4* 3.5   Recent Labs  Lab 08/18/20 1454  LIPASE 22   No results for input(s): AMMONIA in the last 168 hours. CBC: Recent Labs  Lab 08/18/20 1454 08/18/20 2103 08/19/20 0550 08/20/20 0455 08/21/20 0524 08/22/20 0419  WBC 33.8*  --   30.5* 18.9* 13.4* 11.5*  NEUTROABS 29.6*  --   --  13.8* 9.6* 7.9*  HGB 16.4 13.0 12.8* 11.9* 12.3* 11.7*  HCT 51.3 41.4 40.3 37.0* 38.4* 36.8*  MCV 87.2  --  87.6 88.1 86.9 87.6  PLT 261  --  280 255 229 217   Cardiac Enzymes: No results for input(s): CKTOTAL, CKMB, CKMBINDEX, TROPONINI in the last 168 hours. BNP: Invalid input(s): POCBNP CBG: Recent Labs  Lab 08/22/20 0013 08/22/20 0439 08/22/20 0751 08/22/20 1116 08/22/20 1634  GLUCAP 192* 208* 271* 245* 235*    Time coordinating discharge:  36 minutes  Signed:  Catarina Hartshornavid Madden Garron, DO [facility] Pager: [phone] 08/22/2020, 5:11 PM

## 2020-08-22 NOTE — Progress Notes (Signed)
Alert and oriented x 4. independent with adls. Loss IV access and unable to obtain new one by myself and AC. Ac requested Ed help but someone hast come to try. Denies pain.  Capillary glucose monitored. Continue plan of care.

## 2020-08-22 NOTE — Progress Notes (Signed)
Nsg Discharge Note  Admit Date:  08/18/2020 Discharge date: 08/22/2020   Allison Orland Dec to be D/C'd home per MD order.  AVS completed.  Copy for chart, and copy for patient signed, and dated. Patient/caregiver able to verbalize understanding.  Discharge Medication: Allergies as of 08/22/2020      Reactions   Peanut-containing Drug Products Itching, Rash   Not all peanuts trigger the allergic reaction of itching in the throat and ears      Medication List    TAKE these medications   insulin aspart 100 UNIT/ML injection Commonly known as: novoLOG Inject 0.8 Units/hr into the skin See admin instructions. Use in insulin pump 0.6 units/hr plus boluses of 0-10 units for meals.   lisinopril 5 MG tablet Commonly known as: ZESTRIL Take 5 mg by mouth daily.   lovastatin 20 MG tablet Commonly known as: MEVACOR Take 20 mg by mouth daily.   pantoprazole 40 MG tablet Commonly known as: PROTONIX Take 1 tablet (40 mg total) by mouth 2 (two) times daily.   sucralfate 1 GM/10ML suspension Commonly known as: CARAFATE Take 10 mLs (1 g total) by mouth 4 (four) times daily -  with meals and at bedtime. What changed:   how much to take  how to take this  when to take this  additional instructions       Discharge Assessment: Vitals:   08/22/20 0444 08/22/20 1408  BP: (!) 144/80 112/83  Pulse: 93 99  Resp: 18 18  Temp: 98.6 F (37 C) 98.6 F (37 C)  SpO2: 100% 100%   Skin clean, dry and intact without evidence of skin break down, no evidence of skin tears noted. IV catheter discontinued intact. Site without signs and symptoms of complications - no redness or edema noted at insertion site, patient denies c/o pain - only slight tenderness at site.  Dressing with slight pressure applied.  D/c Instructions-Education: Discharge instructions given to patient/family with verbalized understanding. D/c education completed with patient/family including follow up instructions, medication  list, d/c activities limitations if indicated, with other d/c instructions as indicated by MD - patient able to verbalize understanding, all questions fully answered. Patient instructed to return to ED, call 911, or call MD for any changes in condition.  Patient escorted via WC, and D/C home via private auto.  Carole Civil, RN 08/22/2020 6:07 PM

## 2023-04-18 ENCOUNTER — Encounter (HOSPITAL_COMMUNITY): Payer: Self-pay | Admitting: Emergency Medicine

## 2023-04-18 ENCOUNTER — Inpatient Hospital Stay (HOSPITAL_COMMUNITY)
Admission: EM | Admit: 2023-04-18 | Discharge: 2023-04-23 | DRG: 240 | Disposition: A | Discharge status: Home / Self Care | Payer: BC Managed Care – PPO | Attending: Internal Medicine | Admitting: Internal Medicine

## 2023-04-18 ENCOUNTER — Other Ambulatory Visit: Payer: Self-pay

## 2023-04-18 DIAGNOSIS — E11649 Type 2 diabetes mellitus with hypoglycemia without coma: Secondary | ICD-10-CM | POA: Diagnosis present

## 2023-04-18 DIAGNOSIS — Z79899 Other long term (current) drug therapy: Secondary | ICD-10-CM

## 2023-04-18 DIAGNOSIS — E11621 Type 2 diabetes mellitus with foot ulcer: Secondary | ICD-10-CM | POA: Diagnosis present

## 2023-04-18 DIAGNOSIS — L97529 Non-pressure chronic ulcer of other part of left foot with unspecified severity: Secondary | ICD-10-CM | POA: Diagnosis present

## 2023-04-18 DIAGNOSIS — Z9641 Presence of insulin pump (external) (internal): Secondary | ICD-10-CM | POA: Diagnosis present

## 2023-04-18 DIAGNOSIS — E1065 Type 1 diabetes mellitus with hyperglycemia: Secondary | ICD-10-CM

## 2023-04-18 DIAGNOSIS — M86172 Other acute osteomyelitis, left ankle and foot: Secondary | ICD-10-CM | POA: Insufficient documentation

## 2023-04-18 DIAGNOSIS — Z91010 Allergy to peanuts: Secondary | ICD-10-CM | POA: Diagnosis not present

## 2023-04-18 DIAGNOSIS — L03116 Cellulitis of left lower limb: Secondary | ICD-10-CM | POA: Diagnosis present

## 2023-04-18 DIAGNOSIS — E785 Hyperlipidemia, unspecified: Secondary | ICD-10-CM | POA: Diagnosis present

## 2023-04-18 DIAGNOSIS — K219 Gastro-esophageal reflux disease without esophagitis: Secondary | ICD-10-CM | POA: Diagnosis present

## 2023-04-18 DIAGNOSIS — Z794 Long term (current) use of insulin: Secondary | ICD-10-CM

## 2023-04-18 DIAGNOSIS — E1165 Type 2 diabetes mellitus with hyperglycemia: Secondary | ICD-10-CM | POA: Diagnosis not present

## 2023-04-18 DIAGNOSIS — I96 Gangrene, not elsewhere classified: Secondary | ICD-10-CM | POA: Diagnosis present

## 2023-04-18 DIAGNOSIS — E1152 Type 2 diabetes mellitus with diabetic peripheral angiopathy with gangrene: Principal | ICD-10-CM | POA: Diagnosis present

## 2023-04-18 DIAGNOSIS — F17210 Nicotine dependence, cigarettes, uncomplicated: Secondary | ICD-10-CM | POA: Diagnosis present

## 2023-04-18 DIAGNOSIS — E1169 Type 2 diabetes mellitus with other specified complication: Secondary | ICD-10-CM | POA: Diagnosis present

## 2023-04-18 DIAGNOSIS — E11628 Type 2 diabetes mellitus with other skin complications: Secondary | ICD-10-CM | POA: Diagnosis present

## 2023-04-18 DIAGNOSIS — Z23 Encounter for immunization: Secondary | ICD-10-CM

## 2023-04-18 DIAGNOSIS — I10 Essential (primary) hypertension: Secondary | ICD-10-CM | POA: Diagnosis present

## 2023-04-18 DIAGNOSIS — L03032 Cellulitis of left toe: Secondary | ICD-10-CM | POA: Diagnosis present

## 2023-04-18 DIAGNOSIS — E43 Unspecified severe protein-calorie malnutrition: Secondary | ICD-10-CM | POA: Insufficient documentation

## 2023-04-18 DIAGNOSIS — Z833 Family history of diabetes mellitus: Secondary | ICD-10-CM

## 2023-04-18 DIAGNOSIS — M869 Osteomyelitis, unspecified: Secondary | ICD-10-CM | POA: Diagnosis present

## 2023-04-18 DIAGNOSIS — E1143 Type 2 diabetes mellitus with diabetic autonomic (poly)neuropathy: Secondary | ICD-10-CM | POA: Diagnosis present

## 2023-04-18 DIAGNOSIS — K3184 Gastroparesis: Secondary | ICD-10-CM | POA: Diagnosis present

## 2023-04-18 LAB — COMPREHENSIVE METABOLIC PANEL
ALT: 12 U/L (ref 0–44)
AST: 20 U/L (ref 15–41)
Albumin: 2.9 g/dL — ABNORMAL LOW (ref 3.5–5.0)
Alkaline Phosphatase: 126 U/L (ref 38–126)
Anion gap: 7 (ref 5–15)
BUN: 11 mg/dL (ref 6–20)
CO2: 25 mmol/L (ref 22–32)
Calcium: 9.3 mg/dL (ref 8.9–10.3)
Chloride: 108 mmol/L (ref 98–111)
Creatinine, Ser: 0.9 mg/dL (ref 0.61–1.24)
GFR, Estimated: >60 mL/min (ref >60)
Glucose, Bld: 179 mg/dL — ABNORMAL HIGH (ref 70–99)
Potassium: 3.7 mmol/L (ref 3.5–5.1)
Sodium: 140 mmol/L (ref 135–145)
Total Bilirubin: 0.5 mg/dL (ref <1.2)
Total Protein: 7.5 g/dL (ref 6.5–8.1)

## 2023-04-18 LAB — CBC WITH DIFFERENTIAL/PLATELET
Abs Immature Granulocytes: 0.24 10*3/uL — ABNORMAL HIGH (ref 0.00–0.07)
Basophils Absolute: 0.1 10*3/uL (ref 0.0–0.1)
Basophils Relative: 0 %
Eosinophils Absolute: 0 10*3/uL (ref 0.0–0.5)
Eosinophils Relative: 0 %
HCT: 36.9 % — ABNORMAL LOW (ref 39.0–52.0)
Hemoglobin: 11.9 g/dL — ABNORMAL LOW (ref 13.0–17.0)
Immature Granulocytes: 1 %
Lymphocytes Relative: 6 %
Lymphs Abs: 1.8 10*3/uL (ref 0.7–4.0)
MCH: 26.9 pg (ref 26.0–34.0)
MCHC: 32.2 g/dL (ref 30.0–36.0)
MCV: 83.5 fL (ref 80.0–100.0)
Monocytes Absolute: 1.9 10*3/uL — ABNORMAL HIGH (ref 0.1–1.0)
Monocytes Relative: 7 %
Neutro Abs: 23.4 10*3/uL — ABNORMAL HIGH (ref 1.7–7.7)
Neutrophils Relative %: 86 %
Platelets: 443 10*3/uL — ABNORMAL HIGH (ref 150–400)
RBC: 4.42 MIL/uL (ref 4.22–5.81)
RDW: 15.2 % (ref 11.5–15.5)
WBC: 27.5 10*3/uL — ABNORMAL HIGH (ref 4.0–10.5)
nRBC: 0 % (ref 0.0–0.2)

## 2023-04-18 LAB — SEDIMENTATION RATE: Sed Rate: 80 mm/h — ABNORMAL HIGH (ref 0–16)

## 2023-04-18 LAB — I-STAT CG4 LACTIC ACID, ED: Lactic Acid, Venous: 1.5 mmol/L (ref 0.5–1.9)

## 2023-04-18 LAB — C-REACTIVE PROTEIN: CRP: 21.3 mg/dL — ABNORMAL HIGH (ref <1.0)

## 2023-04-18 MED ORDER — INSULIN PUMP
SUBCUTANEOUS | Status: DC
  Administered 2023-04-19: 2 via SUBCUTANEOUS
  Filled 2023-04-18: qty 1

## 2023-04-18 MED ORDER — OXYCODONE HCL 5 MG PO TABS
5.0000 mg | ORAL_TABLET | ORAL | Status: DC | PRN
  Administered 2023-04-20 – 2023-04-23 (×5): 5 mg via ORAL
  Filled 2023-04-18 (×5): qty 1

## 2023-04-18 MED ORDER — PRAVASTATIN SODIUM 40 MG PO TABS
20.0000 mg | ORAL_TABLET | Freq: Every day | ORAL | Status: AC
Start: 2023-04-19 — End: ?
  Administered 2023-04-19 – 2023-04-22 (×4): 20 mg via ORAL
  Filled 2023-04-18 (×4): qty 1

## 2023-04-18 MED ORDER — VANCOMYCIN HCL 1.5 G IV SOLR
1500.0000 mg | Freq: Once | INTRAVENOUS | Status: AC
  Administered 2023-04-19: 1500 mg via INTRAVENOUS
  Filled 2023-04-18 (×2): qty 30

## 2023-04-18 MED ORDER — METRONIDAZOLE 500 MG PO TABS
500.0000 mg | ORAL_TABLET | Freq: Two times a day (BID) | ORAL | Status: DC
  Administered 2023-04-19 – 2023-04-21 (×6): 500 mg via ORAL
  Filled 2023-04-18 (×6): qty 1

## 2023-04-18 MED ORDER — POLYETHYLENE GLYCOL 3350 17 G PO PACK: 17.0000 g | PACK | Freq: Every day | ORAL | Status: DC | PRN

## 2023-04-18 MED ORDER — OXYCODONE-ACETAMINOPHEN 5-325 MG PO TABS
1.0000 | ORAL_TABLET | Freq: Once | ORAL | Status: AC
  Administered 2023-04-18: 1 via ORAL
  Filled 2023-04-18: qty 1

## 2023-04-18 MED ORDER — ACETAMINOPHEN 325 MG PO TABS: 650.0000 mg | ORAL_TABLET | Freq: Four times a day (QID) | ORAL | Status: DC | PRN

## 2023-04-18 MED ORDER — ONDANSETRON HCL 4 MG PO TABS
4.0000 mg | ORAL_TABLET | Freq: Four times a day (QID) | ORAL | Status: DC | PRN
  Filled 2023-04-18: qty 1

## 2023-04-18 MED ORDER — LISINOPRIL 5 MG PO TABS: 5.0000 mg | ORAL_TABLET | Freq: Every day | ORAL | Status: DC

## 2023-04-18 MED ORDER — HYDROMORPHONE HCL 1 MG/ML IJ SOLN
0.5000 mg | INTRAMUSCULAR | Status: DC | PRN
  Administered 2023-04-19 – 2023-04-20 (×5): 0.5 mg via INTRAVENOUS
  Filled 2023-04-18 (×6): qty 0.5

## 2023-04-18 MED ORDER — ENOXAPARIN SODIUM 40 MG/0.4ML IJ SOSY
40.0000 mg | PREFILLED_SYRINGE | INTRAMUSCULAR | Status: DC
  Administered 2023-04-19 – 2023-04-23 (×5): 40 mg via SUBCUTANEOUS
  Filled 2023-04-18 (×5): qty 0.4

## 2023-04-18 MED ORDER — VANCOMYCIN HCL 750 MG/150ML IV SOLN
750.0000 mg | Freq: Two times a day (BID) | INTRAVENOUS | Status: DC
Start: 2023-04-19 — End: 2023-04-21
  Administered 2023-04-19 – 2023-04-20 (×4): 750 mg via INTRAVENOUS
  Filled 2023-04-18 (×4): qty 150

## 2023-04-18 MED ORDER — CEFEPIME HCL 2 G IV SOLR
2.0000 g | Freq: Three times a day (TID) | INTRAVENOUS | Status: DC
  Administered 2023-04-19 – 2023-04-21 (×7): 2 g via INTRAVENOUS
  Filled 2023-04-18 (×8): qty 12.5

## 2023-04-18 MED ORDER — ONDANSETRON HCL 4 MG/2ML IJ SOLN
4.0000 mg | Freq: Four times a day (QID) | INTRAMUSCULAR | Status: DC | PRN
  Administered 2023-04-20 – 2023-04-22 (×5): 4 mg via INTRAVENOUS
  Filled 2023-04-18 (×4): qty 2

## 2023-04-18 MED ORDER — ACETAMINOPHEN 650 MG RE SUPP
650.0000 mg | Freq: Four times a day (QID) | RECTAL | Status: DC | PRN
Start: 2023-04-18 — End: 2023-04-20

## 2023-04-18 NOTE — H&P (Signed)
History and Physical    Danny Vargas:[id number] DOB: 06-09-1970 DOA: 04/18/2023  PCP: Teodoro Spray, MD   Patient coming from: Home   Chief Complaint: Toe infection   HPI: Danny Vargas is a 52 y.o. male with medical history significant for hypertension, hyperlipidemia, GERD, and insulin-dependent diabetes mellitus who presents for evaluation of left second toe infection.   Patient reports that he had an ulcer at the distal second phalanx of the left foot going back approximately 2 months.  The toe has become increasingly edematous and discolored over the past couple days.  He denies subjective fever or chills.  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  ED Course: Upon arrival to the ED, patient is found to be afebrile and saturating well on room air with slightly elevated heart rate and stable blood pressure.  Labs are most notable for albumin 2.9, WBC 27,500, normal lactic acid, CRP 21.3, and ESR 80.  Orthopedic surgery (Dr. Susa Simmonds) was consulted by the ED PA and the patient was treated with vancomycin and Percocet.  Review of Systems:  All other systems reviewed and apart from HPI, are negative.  Past Medical History:  Diagnosis Date   Diabetes mellitus    GERD (gastroesophageal reflux disease)    HLD (hyperlipidemia) 12/14/2017   HTN (hypertension) 12/14/2017   Marijuana abuse 12/14/2017    Past Surgical History:  Procedure Laterality Date   ESOPHAGOGASTRODUODENOSCOPY (EGD) WITH PROPOFOL N/A 12/16/2017   Procedure: ESOPHAGOGASTRODUODENOSCOPY (EGD) WITH PROPOFOL;  Surgeon: Carman Ching, MD;  Location: [facility] ENDOSCOPY;  Service: Endoscopy;  Laterality: N/A;   KNEE SURGERY      Social History:   reports that he has been smoking. He has never used smokeless tobacco. He reports that he does not drink alcohol and does not use drugs.  Allergies  Allergen Reactions   Peanut-Containing Drug Products Itching and  Rash    Not all peanuts trigger the allergic reaction of itching in the throat and ears    Family History  Problem Relation Age of Onset   Diabetes Mother      Prior to Admission medications   Medication Sig Start Date End Date Taking? Authorizing Provider  insulin aspart (NOVOLOG) 100 UNIT/ML injection Inject 0.8 Units/hr into the skin See admin instructions. Use in insulin pump 0.6 units/hr plus boluses of 0-10 units for meals.   Yes [provider]  lisinopril (PRINIVIL,ZESTRIL) 5 MG tablet Take 5 mg by mouth daily.   Yes [provider]  lovastatin (MEVACOR) 20 MG tablet Take 20 mg by mouth daily.   Yes [provider]  pantoprazole (PROTONIX) 40 MG tablet Take 1 tablet (40 mg total) by mouth 2 (two) times daily. Patient not taking: Reported on 04/18/2023 08/22/20   Catarina Hartshorn, MD  sucralfate (CARAFATE) 1 GM/10ML suspension Take 10 mLs (1 g total) by mouth 4 (four) times daily -  with meals and at bedtime. Patient not taking: Reported on 04/18/2023 08/22/20   Catarina Hartshorn, MD    Physical Exam: Vitals:   04/18/23 1846 04/18/23 2200  BP: (!) 144/97 (!) 138/91  Pulse: (!) 114 (!) 101  Resp: 20 18  Temp: 99.7 F (37.6 C) 99.3 F (37.4 C)  TempSrc: Oral   SpO2: 100% 99%  Weight:  65.8 kg  Height:  6\' 2"  (1.88 m)    Constitutional: NAD, calm  Eyes: PERTLA, lids and conjunctivae normal ENMT: Mucous membranes are  moist. Posterior pharynx clear of any exudate or lesions.   Neck: supple, no masses  Respiratory: no wheezing, no crackles. No accessory muscle use.  Cardiovascular: S1 & S2 heard, regular rate and rhythm. No extremity edema.   Abdomen: No distension, no tenderness, soft. Bowel sounds active.  Musculoskeletal: no clubbing / cyanosis. S/p right toe amputation.   Skin: Left 2nd toe necrotic with edema and erythema extending proximally to involve forefoot. Skin otherwise warm, dry, well-perfused. Neurologic: CN 2-12 grossly intact. Moving all  extremities. Alert and oriented.  Psychiatric: Pleasant. Cooperative.    Labs and Imaging on Admission: I have personally reviewed following labs and imaging studies  CBC: Recent Labs  Lab 04/18/23 2050  WBC 27.5*  NEUTROABS 23.4*  HGB 11.9*  HCT 36.9*  MCV 83.5  PLT 443*   Basic Metabolic Panel: Recent Labs  Lab 04/18/23 2050  NA 140  K 3.7  CL 108  CO2 25  GLUCOSE 179*  BUN 11  CREATININE 0.90  CALCIUM 9.3   GFR: Estimated Creatinine Clearance: 88.3 mL/min (by C-G formula based on SCr of 0.9 mg/dL). Liver Function Tests: Recent Labs  Lab 04/18/23 2050  AST 20  ALT 12  ALKPHOS 126  BILITOT 0.5  PROT 7.5  ALBUMIN 2.9*   No results for input(s): "LIPASE", "AMYLASE" in the last 168 hours. No results for input(s): "AMMONIA" in the last 168 hours. Coagulation Profile: No results for input(s): "INR", "PROTIME" in the last 168 hours. Cardiac Enzymes: No results for input(s): "CKTOTAL", "CKMB", "CKMBINDEX", "TROPONINI" in the last 168 hours. BNP (last 3 results) No results for input(s): "PROBNP" in the last 8760 hours. HbA1C: No results for input(s): "HGBA1C" in the last 72 hours. CBG: No results for input(s): "GLUCAP" in the last 168 hours. Lipid Profile: No results for input(s): "CHOL", "HDL", "LDLCALC", "TRIG", "CHOLHDL", "LDLDIRECT" in the last 72 hours. Thyroid Function Tests: No results for input(s): "TSH", "T4TOTAL", "FREET4", "T3FREE", "THYROIDAB" in the last 72 hours. Anemia Panel: No results for input(s): "VITAMINB12", "FOLATE", "FERRITIN", "TIBC", "IRON", "RETICCTPCT" in the last 72 hours. Urine analysis:    Component Value Date/Time   COLORURINE YELLOW 08/18/2020 2000   APPEARANCEUR HAZY (A) 08/18/2020 2000   LABSPEC 1.026 08/18/2020 2000   PHURINE 5.0 08/18/2020 2000   GLUCOSEU >=500 (A) 08/18/2020 2000   HGBUR SMALL (A) 08/18/2020 2000   BILIRUBINUR NEGATIVE 08/18/2020 2000   KETONESUR 20 (A) 08/18/2020 2000   PROTEINUR >=300 (A)  08/18/2020 2000   UROBILINOGEN 0.2 02/04/2014 0125   NITRITE NEGATIVE 08/18/2020 2000   LEUKOCYTESUR NEGATIVE 08/18/2020 2000   Sepsis Labs: @LABRCNTIP (procalcitonin:4,lacticidven:4) )No results found for this or any previous visit (from the past 240 hours).   Radiological Exams on Admission: No results found.  EKG: Independently reviewed.   Assessment/Plan   1. Left 2nd toe infection  - Check ABI, follow-up MRI findings, continue empiric antibiotics, monitor culture and clinical course    2. Insulin-dependent DM  - A1c was 11.4% in August 2024  - Continue insulin pump, monitor CBGs   3. HTN  - Continue lisinopril   4. HLD  - Continue statin    DVT prophylaxis: Lovenox Code Status: Full  Level of Care: Level of care: Med-Surg Family Communication: None present   Disposition Plan:  Patient is from: home  Anticipated d/c is to: Home  Anticipated d/c date is: 04/22/23 Patient currently: Pending orthopedic surgery consultation  Consults called: Orthopedic surgery  Admission status: Inpatient     Julia Kulzer S Oletha Tolson,  MD [facility]  04/18/2023, 10:39 PM

## 2023-04-18 NOTE — ED Notes (Signed)
IV attempt x 2, unsuccessful, IV team consult entered

## 2023-04-18 NOTE — Progress Notes (Signed)
ED Pharmacy Antibiotic Sign Off An antibiotic consult was received from an ED provider for Vancomycin per pharmacy dosing for osteomyelitis and gangrene. A chart review was completed to assess appropriateness.   The following one time order(s) were placed:  Vancomycin 1500mg  IV x1  Further antibiotic and/or antibiotic pharmacy consults should be ordered by the admitting provider if indicated.   Thank you for allowing pharmacy to be a part of this patient's care.   Wilburn Cornelia, PharmD, BCPS Clinical Pharmacist 04/18/2023 10:14 PM   Please refer to [facility] for pharmacy phone number

## 2023-04-18 NOTE — ED Provider Notes (Signed)
[facility] AT [facility] Provider Note   CSN: [id number] Arrival date & time: 04/18/23  1819     History  Chief Complaint  Patient presents with   Gangrene   Osteomyelitis     Danny Vargas is a 52 y.o. male, history of hypertension, diabetes, who presents to the ED secondary to left second toe pain pain/blackness, that started about 3 days ago.  States that he has been having issue, for his second toe, on his left foot, for the last 2 months.  Notes that he had the toe that was longer than others, and was rubbing his shoe, and he decided to peel back some of the skin, and then developed an ulcer.  He states that the area became black 3 days ago.  Denies any chronic exposure to cold, or water.  No trauma to foot.  States he went to [facility] yesterday, had a white count of 21,000k , for a abdominal pain, and was discharged home, when he told him that his toe was black.  Denies any fever, IV drug use.    Home Medications Prior to Admission medications   Medication Sig Start Date End Date Taking? Authorizing Provider  insulin aspart (NOVOLOG) 100 UNIT/ML injection Inject 0.8 Units/hr into the skin See admin instructions. Use in insulin pump 0.6 units/hr plus boluses of 0-10 units for meals.   Yes [provider]  lisinopril (PRINIVIL,ZESTRIL) 5 MG tablet Take 5 mg by mouth daily.   Yes [provider]  lovastatin (MEVACOR) 20 MG tablet Take 20 mg by mouth daily.   Yes [provider]  pantoprazole (PROTONIX) 40 MG tablet Take 1 tablet (40 mg total) by mouth 2 (two) times daily. Patient not taking: Reported on 04/18/2023 08/22/20   Catarina Hartshorn, MD  sucralfate (CARAFATE) 1 GM/10ML suspension Take 10 mLs (1 g total) by mouth 4 (four) times daily -  with meals and at bedtime. Patient not taking: Reported on 04/18/2023 08/22/20   Catarina Hartshorn, MD      Allergies    Peanut-containing drug products    Review of Systems   Review  of Systems  Constitutional:  Negative for fever.  Skin:  Positive for wound.    Physical Exam Updated Vital Signs BP (!) 138/91   Pulse (!) 101   Temp 99.3 F (37.4 C)   Resp 18   Ht 6\' 2"  (1.88 m)   Wt 65.8 kg   SpO2 99%   BMI 18.62 kg/m  Physical Exam Vitals and nursing note reviewed.  Constitutional:      General: He is not in acute distress.    Appearance: He is well-developed.  HENT:     Head: Normocephalic and atraumatic.  Eyes:     Conjunctiva/sclera: Conjunctivae normal.  Cardiovascular:     Rate and Rhythm: Normal rate and regular rhythm.     Heart sounds: No murmur heard. Pulmonary:     Effort: Pulmonary effort is normal. No respiratory distress.     Breath sounds: Normal breath sounds.  Abdominal:     Palpations: Abdomen is soft.     Tenderness: There is no abdominal tenderness.  Musculoskeletal:        General: No swelling.     Cervical back: Neck supple.  Skin:    General: Skin is warm and dry.     Capillary Refill: Capillary refill takes less than 2 seconds.     Comments: Necrotic second toe on left foot.  Positive dorsalis pedis pulse  Neurological:     Mental Status: He is alert.  Psychiatric:        Mood and Affect: Mood normal.     ED Results / Procedures / Treatments   Labs (all labs ordered are listed, but only abnormal results are displayed) Labs Reviewed  COMPREHENSIVE METABOLIC PANEL - Abnormal; Notable for the following components:      Result Value   Glucose, Bld 179 (*)    Albumin 2.9 (*)    All other components within normal limits  CBC WITH DIFFERENTIAL/PLATELET - Abnormal; Notable for the following components:   WBC 27.5 (*)    Hemoglobin 11.9 (*)    HCT 36.9 (*)    Platelets 443 (*)    Neutro Abs 23.4 (*)    Monocytes Absolute 1.9 (*)    Abs Immature Granulocytes 0.24 (*)    All other components within normal limits  C-REACTIVE PROTEIN - Abnormal; Notable for the following components:   CRP 21.3 (*)    All other  components within normal limits  SEDIMENTATION RATE - Abnormal; Notable for the following components:   Sed Rate 80 (*)    All other components within normal limits  CULTURE, BLOOD (ROUTINE X 2)  CULTURE, BLOOD (ROUTINE X 2)  HEMOGLOBIN A1C  PREALBUMIN  HIV ANTIBODY (ROUTINE TESTING W REFLEX)  BASIC METABOLIC PANEL  CBC  I-STAT CG4 LACTIC ACID, ED    EKG None  Radiology No results found.  Procedures Procedures    Medications Ordered in ED Medications  Vancomycin (VANCOCIN) 1,500 mg in sodium chloride 0.9 % 500 mL IVPB (has no administration in time range)  lisinopril (ZESTRIL) tablet 5 mg (has no administration in time range)  pravastatin (PRAVACHOL) tablet 20 mg (has no administration in time range)  metroNIDAZOLE (FLAGYL) tablet 500 mg (has no administration in time range)  enoxaparin (LOVENOX) injection 40 mg (has no administration in time range)  acetaminophen (TYLENOL) tablet 650 mg (has no administration in time range)    Or  acetaminophen (TYLENOL) suppository 650 mg (has no administration in time range)  oxyCODONE (Oxy IR/ROXICODONE) immediate release tablet 5 mg (has no administration in time range)  HYDROmorphone (DILAUDID) injection 0.5 mg (has no administration in time range)  polyethylene glycol (MIRALAX / GLYCOLAX) packet 17 g (has no administration in time range)  ondansetron (ZOFRAN) tablet 4 mg (has no administration in time range)    Or  ondansetron (ZOFRAN) injection 4 mg (has no administration in time range)  insulin pump (has no administration in time range)  ceFEPIme (MAXIPIME) 2 g in sodium chloride 0.9 % 100 mL IVPB (has no administration in time range)  vancomycin (VANCOREADY) IVPB 750 mg/150 mL (has no administration in time range)  oxyCODONE-acetaminophen (PERCOCET/ROXICET) 5-325 MG per tablet 1 tablet (1 tablet Oral Given 04/18/23 1959)    ED Course/ Medical Decision Making/ A&P                                 Medical Decision Making See  Abigail Harris's note, for further detail as listed below "Patient was admitted to [facility] for DKA.  He apparently had a source of gangrenous left second toe however was discharged.  I got a call from his physician Dr. Ulice Brilliant who is a podiatrist in [place] who states that he was shocked he was discharged with the gangrenous foot he does not have privileges here he  talked to Dr. Lajoyce Corners who recommended he come in to be admitted.  Patient has obvious wet growing gangrene of the left septic second digit with extensive cellulitis." Obvious necrotic toe, started on vancomycin, MRI foot.  Amount and/or Complexity of Data Reviewed Labs:     Details: Leukocytosis of 27K, lactic acid within normal limits Discussion of management or test interpretation with external provider(s): Discussed with Dr. Susa Simmonds on-call, he will reach out to Dr. Audrie Lia team in the a.m., in regards to possible surgery.  Started on vancomycin.  I discussed this with Dr. Antionette Char, who accepts admission of the patient.  For cellulitis of the foot and/leg, as well as necrotic toe.  Risk Prescription drug management. Decision regarding hospitalization.    Final Clinical Impression(s) / ED Diagnoses Final diagnoses:  Necrosis of toe (HCC)  Cellulitis of left lower extremity    Rx / DC Orders ED Discharge Orders     None         Yanni Quiroa, Harley [place], PA 04/18/23 2352    Anders Simmonds T, DO 04/19/23 1755

## 2023-04-18 NOTE — ED Notes (Signed)
Patient denies pain and is resting comfortably.  

## 2023-04-18 NOTE — ED Notes (Signed)
ED TO INPATIENT HANDOFF REPORT  ED Nurse Name and Phone #:   S Name/Age/Gender Danny Vargas [birth year] 52 y.o. male Room/Bed: H020C/H020C  Code Status   Code Status: Full Code  Home/SNF/Other Home Patient oriented to: self, place, time, and situation Is this baseline? Yes   Triage Complete: Triage complete  Chief Complaint Gangrene of toe of left foot [facility]) [I96]  Triage Note Pt presents after being sent by podiatry and Dr. Lajoyce Corners for eval of wet gangrene to left 2nd toe.  Pt was at [facility] hospital and discharged despite wbc of 21.   Pictures of foot taken by PA Harris and uploaded to chart.  Pt was sent for admission and surgical intervention.    Allergies Allergies  Allergen Reactions   Peanut-Containing Drug Products Itching and Rash    Not all peanuts trigger the allergic reaction of itching in the throat and ears    Level of Care/Admitting Diagnosis ED Disposition     ED Disposition  Admit   Condition  --   Comment  Hospital Area: [facility] [[id number]]  Level of Care: Med-Surg [16]  May admit patient to [facility] or [facility] if equivalent level of care is available:: Yes  Covid Evaluation: Asymptomatic - no recent exposure (last 10 days) testing not required  Diagnosis: Gangrene of toe of left foot [facility]) [[id number]]  Admitting Physician: Briscoe Deutscher [[id number]]  Attending Physician: Briscoe Deutscher [[id number]]  Certification:: I certify this patient will need inpatient services for at least 2 midnights  Expected Medical Readiness: 04/21/2023          B Medical/Surgery History Past Medical History:  Diagnosis Date   Diabetes mellitus    GERD (gastroesophageal reflux disease)    HLD (hyperlipidemia) 12/14/2017   HTN (hypertension) 12/14/2017   Marijuana abuse 12/14/2017   Past Surgical History:  Procedure Laterality Date   ESOPHAGOGASTRODUODENOSCOPY (EGD) WITH PROPOFOL N/A 12/16/2017   Procedure: ESOPHAGOGASTRODUODENOSCOPY (EGD) WITH  PROPOFOL;  Surgeon: Carman Ching, MD;  Location: [facility] ENDOSCOPY;  Service: Endoscopy;  Laterality: N/A;   KNEE SURGERY       A IV Location/Drains/Wounds Patient Lines/Drains/Airways Status     Active Line/Drains/Airways     Name Placement date Placement time Site Days   Peripheral IV 04/18/23 20 G 1" Right;Upper Arm 04/18/23  2011  Arm  less than 1            Intake/Output Last 24 hours No intake or output data in the 24 hours ending 04/18/23 2240  Labs/Imaging Results for orders placed or performed during the hospital encounter of 04/18/23 (from the past 48 hours)  I-Stat Lactic Acid, ED     Status: None   Collection Time: 04/18/23  8:46 PM  Result Value Ref Range   Lactic Acid, Venous 1.5 0.5 - 1.9 mmol/L  Comprehensive metabolic panel     Status: Abnormal   Collection Time: 04/18/23  8:50 PM  Result Value Ref Range   Sodium 140 135 - 145 mmol/L   Potassium 3.7 3.5 - 5.1 mmol/L   Chloride 108 98 - 111 mmol/L   CO2 25 22 - 32 mmol/L   Glucose, Bld 179 (H) 70 - 99 mg/dL    Comment: Glucose reference range applies only to samples taken after fasting for at least 8 hours.   BUN 11 6 - 20 mg/dL   Creatinine, Ser 3.87 0.61 - 1.24 mg/dL   Calcium 9.3 8.9 - 56.4 mg/dL   Total Protein 7.5  6.5 - 8.1 g/dL   Albumin 2.9 (L) 3.5 - 5.0 g/dL   AST 20 15 - 41 U/L   ALT 12 0 - 44 U/L   Alkaline Phosphatase 126 38 - 126 U/L   Total Bilirubin 0.5 <1.2 mg/dL   GFR, Estimated >10 >27 mL/min    Comment: (NOTE) Calculated using the CKD-EPI Creatinine Equation (2021)    Anion gap 7 5 - 15    Comment: Performed at [facility], [address], [place], [place] [address]  CBC with Differential     Status: Abnormal   Collection Time: 04/18/23  8:50 PM  Result Value Ref Range   WBC 27.5 (H) 4.0 - 10.5 K/uL   RBC 4.42 4.22 - 5.81 MIL/uL   Hemoglobin 11.9 (L) 13.0 - 17.0 g/dL   HCT 44.0 (L) 34.7 - 42.5 %   MCV 83.5 80.0 - 100.0 fL   MCH 26.9 26.0 - 34.0 pg   MCHC 32.2 30.0 -  36.0 g/dL   RDW 95.6 38.7 - 56.4 %   Platelets 443 (H) 150 - 400 K/uL   nRBC 0.0 0.0 - 0.2 %   Neutrophils Relative % 86 %   Neutro Abs 23.4 (H) 1.7 - 7.7 K/uL   Lymphocytes Relative 6 %   Lymphs Abs 1.8 0.7 - 4.0 K/uL   Monocytes Relative 7 %   Monocytes Absolute 1.9 (H) 0.1 - 1.0 K/uL   Eosinophils Relative 0 %   Eosinophils Absolute 0.0 0.0 - 0.5 K/uL   Basophils Relative 0 %   Basophils Absolute 0.1 0.0 - 0.1 K/uL   Immature Granulocytes 1 %   Abs Immature Granulocytes 0.24 (H) 0.00 - 0.07 K/uL    Comment: Performed at [facility], [address], [place], [place] [address]  C-reactive protein     Status: Abnormal   Collection Time: 04/18/23  8:50 PM  Result Value Ref Range   CRP 21.3 (H) <1.0 mg/dL    Comment: Performed at [facility], [address] [place], [place] [address]  Sedimentation rate     Status: Abnormal   Collection Time: 04/18/23  8:50 PM  Result Value Ref Range   Sed Rate 80 (H) 0 - 16 mm/hr    Comment: Performed at [facility], [address] [place], [place] [address]   No results found.  Pending Labs Unresulted Labs (From admission, onward)     Start     Ordered   04/25/23 0500  Creatinine, serum  (enoxaparin (LOVENOX)    CrCl >/= 30 ml/min)  Weekly,   R     Comments: while on enoxaparin therapy    04/18/23 2239   04/19/23 0500  Hemoglobin A1c  Tomorrow morning,   R        04/18/23 2239   04/19/23 0500  Prealbumin  Tomorrow morning,   R        04/18/23 2239   04/19/23 0500  HIV Antibody (routine testing w rflx)  (HIV Antibody (Routine testing w reflex) panel)  Tomorrow morning,   R        04/18/23 2239   04/19/23 0500  Basic metabolic panel  Daily,   R      04/18/23 2239   04/19/23 0500  CBC  Daily,   R      04/18/23 2239   04/18/23 1856  Blood Cultures x 2 sites  BLOOD CULTURE X 2,   STAT  04/18/23 1858            Vitals/Pain Today's Vitals   04/18/23 1846 04/18/23 1852 04/18/23 2200  BP: (!) 144/97  (!)  138/91  Pulse: (!) 114  (!) 101  Resp: 20  18  Temp: 99.7 F (37.6 C)  99.3 F (37.4 C)  TempSrc: Oral    SpO2: 100%  99%  Weight:   145 lb (65.8 kg)  Height:   6\' 2"  (1.88 m)  PainSc:  0-No pain     Isolation Precautions No active isolations  Medications Medications  Vancomycin (VANCOCIN) 1,500 mg in sodium chloride 0.9 % 500 mL IVPB (has no administration in time range)  lisinopril (ZESTRIL) tablet 5 mg (has no administration in time range)  pravastatin (PRAVACHOL) tablet 20 mg (has no administration in time range)  metroNIDAZOLE (FLAGYL) tablet 500 mg (has no administration in time range)  enoxaparin (LOVENOX) injection 40 mg (has no administration in time range)  acetaminophen (TYLENOL) tablet 650 mg (has no administration in time range)    Or  acetaminophen (TYLENOL) suppository 650 mg (has no administration in time range)  oxyCODONE (Oxy IR/ROXICODONE) immediate release tablet 5 mg (has no administration in time range)  HYDROmorphone (DILAUDID) injection 0.5 mg (has no administration in time range)  polyethylene glycol (MIRALAX / GLYCOLAX) packet 17 g (has no administration in time range)  ondansetron (ZOFRAN) tablet 4 mg (has no administration in time range)    Or  ondansetron (ZOFRAN) injection 4 mg (has no administration in time range)  insulin pump (has no administration in time range)  oxyCODONE-acetaminophen (PERCOCET/ROXICET) 5-325 MG per tablet 1 tablet (1 tablet Oral Given 04/18/23 1959)    Mobility walks     Focused Assessments    R Recommendations: See Admitting Provider Note  Report given to:   Additional Notes:

## 2023-04-18 NOTE — ED Triage Notes (Signed)
Pt presents after being sent by podiatry and Dr. Lajoyce Corners for eval of wet gangrene to left 2nd toe.  Pt was at [facility] hospital and discharged despite wbc of 21.   Pictures of foot taken by PA Harris and uploaded to chart.  Pt was sent for admission and surgical intervention.

## 2023-04-18 NOTE — ED Notes (Addendum)
Admit Provider at bedside. 

## 2023-04-18 NOTE — ED Provider Triage Note (Signed)
Emergency Medicine Provider Triage Evaluation Note  Danny Vargas , a 52 y.o. male  was evaluated in triage.  Pt complains of osteomyelitis.  Patient was admitted to [facility] for DKA.  He apparently had a source of gangrenous left second toe however was discharged.  I got a call from his physician Dr. Ulice Brilliant who is a podiatrist in [place] who states that he was shocked he was discharged with the gangrenous foot he does not have privileges here he talked to Dr. Lajoyce Corners who recommended he come in to be admitted.  Patient has obvious wet growing gangrene of the left septic second digit with extensive cellulitis..  Review of Systems  Positive: Gangrene Negative: Fever  Physical Exam  BP (!) 144/97 (BP Location: Right Arm)   Pulse (!) 114   Temp 99.7 F (37.6 C) (Oral)   Resp 20   SpO2 100%  Gen:   Awake, no distress   Resp:  Normal effort  MSK:   Moves extremities without difficulty  Other:    Medical Decision Making  Medically screening exam initiated at 7:08 PM.  Appropriate orders placed.  Danny Vargas was informed that the remainder of the evaluation will be completed by another provider, this initial triage assessment does not replace that evaluation, and the importance of remaining in the ED until their evaluation is complete.     Arthor Captain, PA-C 04/18/23 1912

## 2023-04-18 NOTE — ED Notes (Addendum)
Attempted to collect both sets of cultures was only able to obtain one set and labs on Left forearm. 1st lactic was in normal range, 2nd can be discontinued. Was able to get 2nd set of cultures from different site @ 2200 but same side after allotted time passed.

## 2023-04-18 NOTE — Progress Notes (Signed)
Pharmacy Antibiotic Note  Danny Vargas is a 52 y.o. male admitted on 04/18/2023 with gangrene to left 2nd toe.  Pharmacy has been consulted for Cefepime and vancomycin dosing.  -WBC 27.5, sCr 0.9, lactate 1.5, afebrile -blood cultures collected -Vanc x1  Plan: -Cefepime 2g IV every 8 hours -Vancomycin 1500mg  IV x1 -Vancomycin 750mg  IV every 12 hours (AUC 407, Vd 0.72, TBW) -Monitor renal function -Follow up signs of clinical improvement, LOT, de-escalation of antibiotics  -Follow up surgical plans   Height: 6\' 2"  (188 cm) Weight: 65.8 kg (145 lb) IBW/kg (Calculated) : 82.2  Temp (24hrs), Avg:99.5 F (37.5 C), Min:99.3 F (37.4 C), Max:99.7 F (37.6 C)  Recent Labs  Lab 04/18/23 2046 04/18/23 2050  WBC  --  27.5*  CREATININE  --  0.90  LATICACIDVEN 1.5  --     Estimated Creatinine Clearance: 88.3 mL/min (by C-G formula based on SCr of 0.9 mg/dL).    Allergies  Allergen Reactions   Peanut-Containing Drug Products Itching and Rash    Not all peanuts trigger the allergic reaction of itching in the throat and ears    Antimicrobials this admission: Cefepime 12/19 >>  Vancomycin 12/19 >>   Microbiology results: 12/19 BCx:   Thank you for allowing pharmacy to be a part of this patient's care.  Arabella Merles, PharmD. Clinical Pharmacist 04/18/2023 10:47 PM

## 2023-04-19 ENCOUNTER — Inpatient Hospital Stay (HOSPITAL_COMMUNITY): Payer: BC Managed Care – PPO

## 2023-04-19 ENCOUNTER — Encounter (HOSPITAL_COMMUNITY): Payer: BC Managed Care – PPO

## 2023-04-19 DIAGNOSIS — E43 Unspecified severe protein-calorie malnutrition: Secondary | ICD-10-CM | POA: Insufficient documentation

## 2023-04-19 DIAGNOSIS — I96 Gangrene, not elsewhere classified: Secondary | ICD-10-CM | POA: Diagnosis not present

## 2023-04-19 DIAGNOSIS — E1165 Type 2 diabetes mellitus with hyperglycemia: Secondary | ICD-10-CM | POA: Diagnosis not present

## 2023-04-19 DIAGNOSIS — Z794 Long term (current) use of insulin: Secondary | ICD-10-CM | POA: Diagnosis not present

## 2023-04-19 DIAGNOSIS — L03116 Cellulitis of left lower limb: Secondary | ICD-10-CM

## 2023-04-19 LAB — BASIC METABOLIC PANEL
Anion gap: 7 (ref 5–15)
BUN: 8 mg/dL (ref 6–20)
CO2: 24 mmol/L (ref 22–32)
Calcium: 8.9 mg/dL (ref 8.9–10.3)
Chloride: 111 mmol/L (ref 98–111)
Creatinine, Ser: 0.79 mg/dL (ref 0.61–1.24)
GFR, Estimated: >60 mL/min (ref >60)
Glucose, Bld: 166 mg/dL — ABNORMAL HIGH (ref 70–99)
Potassium: 3.7 mmol/L (ref 3.5–5.1)
Sodium: 142 mmol/L (ref 135–145)

## 2023-04-19 LAB — GLUCOSE, CAPILLARY
Glucose-Capillary: 152 mg/dL — ABNORMAL HIGH (ref 70–99)
Glucose-Capillary: 180 mg/dL — ABNORMAL HIGH (ref 70–99)
Glucose-Capillary: 224 mg/dL — ABNORMAL HIGH (ref 70–99)
Glucose-Capillary: 259 mg/dL — ABNORMAL HIGH (ref 70–99)
Glucose-Capillary: 343 mg/dL — ABNORMAL HIGH (ref 70–99)
Glucose-Capillary: 353 mg/dL — ABNORMAL HIGH (ref 70–99)
Glucose-Capillary: 47 mg/dL — ABNORMAL LOW (ref 70–99)

## 2023-04-19 LAB — CBC
HCT: 32.9 % — ABNORMAL LOW (ref 39.0–52.0)
Hemoglobin: 10.7 g/dL — ABNORMAL LOW (ref 13.0–17.0)
MCH: 26.4 pg (ref 26.0–34.0)
MCHC: 32.5 g/dL (ref 30.0–36.0)
MCV: 81.2 fL (ref 80.0–100.0)
Platelets: 417 10*3/uL — ABNORMAL HIGH (ref 150–400)
RBC: 4.05 MIL/uL — ABNORMAL LOW (ref 4.22–5.81)
RDW: 15.3 % (ref 11.5–15.5)
WBC: 23 10*3/uL — ABNORMAL HIGH (ref 4.0–10.5)
nRBC: 0 % (ref 0.0–0.2)

## 2023-04-19 LAB — HIV ANTIBODY (ROUTINE TESTING W REFLEX): HIV Screen 4th Generation wRfx: NONREACTIVE

## 2023-04-19 LAB — HEMOGLOBIN A1C
Hgb A1c MFr Bld: 9.6 % — ABNORMAL HIGH (ref 4.8–5.6)
Mean Plasma Glucose: 228.82 mg/dL

## 2023-04-19 LAB — PREALBUMIN: Prealbumin: <5 mg/dL — ABNORMAL LOW (ref 18–38)

## 2023-04-19 MED ORDER — POVIDONE-IODINE 10 % EX SWAB: 2.0000 | Freq: Once | CUTANEOUS | Status: DC

## 2023-04-19 MED ORDER — CEFAZOLIN SODIUM-DEXTROSE 2-4 GM/100ML-% IV SOLN
2.0000 g | INTRAVENOUS | Status: AC
  Administered 2023-04-20: 2 g via INTRAVENOUS

## 2023-04-19 MED ORDER — CHLORHEXIDINE GLUCONATE 4 % EX SOLN
60.0000 mL | Freq: Once | CUTANEOUS | Status: AC
  Administered 2023-04-20: 4 via TOPICAL
  Filled 2023-04-19: qty 60

## 2023-04-19 MED ORDER — INSULIN GLARGINE-YFGN 100 UNIT/ML ~~LOC~~ SOLN: 15.0000 [IU] | Freq: Two times a day (BID) | SUBCUTANEOUS | Status: DC

## 2023-04-19 MED ORDER — INSULIN GLARGINE-YFGN 100 UNIT/ML ~~LOC~~ SOLN
10.0000 [IU] | Freq: Two times a day (BID) | SUBCUTANEOUS | Status: DC
  Administered 2023-04-19 – 2023-04-21 (×4): 10 [IU] via SUBCUTANEOUS
  Filled 2023-04-19 (×5): qty 0.1

## 2023-04-19 MED ORDER — INSULIN ASPART 100 UNIT/ML IJ SOLN
0.0000 [IU] | INTRAMUSCULAR | Status: DC
  Administered 2023-04-19: 7 [IU] via SUBCUTANEOUS
  Administered 2023-04-20: 5 [IU] via SUBCUTANEOUS

## 2023-04-19 MED ORDER — DEXTROSE 50 % IV SOLN
25.0000 g | INTRAVENOUS | Status: AC
  Administered 2023-04-19: 25 g via INTRAVENOUS
  Filled 2023-04-19: qty 50

## 2023-04-19 NOTE — Progress Notes (Signed)
[facility] PROGRESS NOTE    Progress Note  Danny Vargas  ZOX:[id number] DOB: [birth year] DOA: 04/18/2023 PCP: Teodoro Spray, MD     Brief Narrative:   Danny Vargas is an 52 y.o. male past medical history of essential hypertension hyperlipidemia, history of left second toe ulcer for about 2 months, insulin-dependent diabetes mellitus who presents with a left second toe infection.  He relates he started getting edematous and discolored over the last several days.  Assessment/Plan:   Gangrene of toe of left foot (HCC) ABIs pending MRI pending Blood cultures have been sent Will start empirically on antibiotics bank Flagyl and cefepime Leukocytosis has resolved has remained afebrile. Narcotics per Ortho. Orthopedic surgery was consulted awaiting recommendations  Uncontrolled type 2 diabetes mellitus with hyperglycemia, with long-term current use of insulin  A1c on admission 9.6, He is currently on insulin pump.  Need tighter control of his blood glucose.  Essential HTN (hypertension) Hold lisinopril as may need surgical intervention.     DVT prophylaxis: lovenox Family Communication:Son Status is: Inpatient Remains inpatient appropriate because: Left foot gangrene    Code Status:     Code Status Orders  (From admission, onward)           Start     Ordered   04/18/23 2238  Full code  Continuous       Question:  By:  Answer:  Consent: discussion documented in EHR   04/18/23 2239           Code Status History     Date Active Date Inactive Code Status Order ID Comments User Context   08/18/2020 1951 08/22/2020 2312 Full Code [id number]  Onnie Boer, MD ED   12/14/2017 0811 12/17/2017 1958 Full Code [id number]  Delaine Lame, MD Inpatient   12/01/2017 0235 12/01/2017 2121 Full Code [id number]  Clydie Braun, MD Inpatient   12/01/2017 0226 12/01/2017 0233 Full Code [id number]  Clydie Braun, MD Inpatient   10/28/2013 2148 11/01/2013 1930 Full Code  [id number]  Yevonne Pax, MD Inpatient         IV Access:   Peripheral IV   Procedures and diagnostic studies:   No results found.   Medical Consultants:   None.   Subjective:    Danny Vargas no pain  Objective:    Vitals:   04/18/23 1846 04/18/23 2200 04/19/23 0000 04/19/23 0516  BP: (!) 144/97 (!) 138/91 132/80 123/78  Pulse: (!) 114 (!) 101 (!) 110 86  Resp: 20 18 18 17   Temp: 99.7 F (37.6 C) 99.3 F (37.4 C) 99.5 F (37.5 C) 98.6 F (37 C)  TempSrc: Oral  Oral Oral  SpO2: 100% 99% 99% 99%  Weight:  65.8 kg    Height:  6\' 2"  (1.88 m)     SpO2: 99 %   Intake/Output Summary (Last 24 hours) at 04/19/2023 0802 Last data filed at 04/19/2023 0345 Gross per 24 hour  Intake 696.09 ml  Output --  Net 696.09 ml   Filed Weights   04/18/23 2200  Weight: 65.8 kg    Exam: General exam: In no acute distress, follows following order in the room Respiratory system: Good air movement and clear to auscultation. Cardiovascular system: S1 & S2 heard, RRR. No JVD. Gastrointestinal system: Abdomen is nondistended, soft and nontender.  Extremities: Left foot is wrapped Psychiatry: Judgement and insight appear normal. Mood & affect appropriate.    Data Reviewed:    Labs: Basic Metabolic Panel:  Recent Labs  Lab 04/18/23 2050 04/19/23 0620  NA 140 142  K 3.7 3.7  CL 108 111  CO2 25 24  GLUCOSE 179* 166*  BUN 11 8  CREATININE 0.90 0.79  CALCIUM 9.3 8.9   GFR Estimated Creatinine Clearance: 99.4 mL/min (by C-G formula based on SCr of 0.79 mg/dL). Liver Function Tests: Recent Labs  Lab 04/18/23 2050  AST 20  ALT 12  ALKPHOS 126  BILITOT 0.5  PROT 7.5  ALBUMIN 2.9*   No results for input(s): "LIPASE", "AMYLASE" in the last 168 hours. No results for input(s): "AMMONIA" in the last 168 hours. Coagulation profile No results for input(s): "INR", "PROTIME" in the last 168 hours. COVID-19 Labs  Recent Labs    04/18/23 2050  CRP 21.3*     Lab Results  Component Value Date   SARSCOV2NAA NEGATIVE 08/18/2020    CBC: Recent Labs  Lab 04/18/23 2050 04/19/23 0620  WBC 27.5* 23.0*  NEUTROABS 23.4*  --   HGB 11.9* 10.7*  HCT 36.9* 32.9*  MCV 83.5 81.2  PLT 443* 417*   Cardiac Enzymes: No results for input(s): "CKTOTAL", "CKMB", "CKMBINDEX", "TROPONINI" in the last 168 hours. BNP (last 3 results) No results for input(s): "PROBNP" in the last 8760 hours. CBG: Recent Labs  Lab 04/19/23 0131  GLUCAP 353*   D-Dimer: No results for input(s): "DDIMER" in the last 72 hours. Hgb A1c: Recent Labs    04/19/23 0620  HGBA1C 9.6*   Lipid Profile: No results for input(s): "CHOL", "HDL", "LDLCALC", "TRIG", "CHOLHDL", "LDLDIRECT" in the last 72 hours. Thyroid function studies: No results for input(s): "TSH", "T4TOTAL", "T3FREE", "THYROIDAB" in the last 72 hours.  Invalid input(s): "FREET3" Anemia work up: No results for input(s): "VITAMINB12", "FOLATE", "FERRITIN", "TIBC", "IRON", "RETICCTPCT" in the last 72 hours. Sepsis Labs: Recent Labs  Lab 04/18/23 2046 04/18/23 2050 04/19/23 0620  WBC  --  27.5* 23.0*  LATICACIDVEN 1.5  --   --    Microbiology No results found for this or any previous visit (from the past 240 hours).   Medications:    enoxaparin (LOVENOX) injection  40 mg Subcutaneous Q24H   insulin pump   Subcutaneous Q4H   lisinopril  5 mg Oral Daily   metroNIDAZOLE  500 mg Oral Q12H   pravastatin  20 mg Oral q1800   Continuous Infusions:  ceFEPime (MAXIPIME) IV 2 g (04/19/23 0120)   vancomycin        LOS: 1 day   Marinda Elk  [facility]  04/19/2023, 8:02 AM

## 2023-04-19 NOTE — H&P (View-Only) (Signed)
ORTHOPAEDIC CONSULTATION  REQUESTING PHYSICIAN: Marinda Elk, MD  Chief Complaint: Necrotic gangrene right foot second toe.  HPI: Danny Vargas is a 52 y.o. male who presents with diabetic insensate neuropathy with a gangrenous necrotic left second toe.  Patient states he has had ulceration for about 2 months.  Patient states he has not smoked in a while.  Patient states he went to [facility] 2 days ago and was told that he did not have a bone infection in the second toe and he was discharged to home.  I was called by podiatry last evening and reviewed the patient's clinical findings and recommended urgent admission to [facility] for IV antibiotics and surgical treatment.  Past Medical History:  Diagnosis Date   Diabetes mellitus    GERD (gastroesophageal reflux disease)    HLD (hyperlipidemia) 12/14/2017   HTN (hypertension) 12/14/2017   Marijuana abuse 12/14/2017   Past Surgical History:  Procedure Laterality Date   ESOPHAGOGASTRODUODENOSCOPY (EGD) WITH PROPOFOL N/A 12/16/2017   Procedure: ESOPHAGOGASTRODUODENOSCOPY (EGD) WITH PROPOFOL;  Surgeon: Carman Ching, MD;  Location: [facility] ENDOSCOPY;  Service: Endoscopy;  Laterality: N/A;   KNEE SURGERY     Social History   Socioeconomic History   Marital status: Single    Spouse name: Not on file   Number of children: Not on file   Years of education: Not on file   Highest education level: Not on file  Occupational History   Not on file  Tobacco Use   Smoking status: Every Day    Current packs/day: 1.00    Types: Cigarettes   Smokeless tobacco: Never  Vaping Use   Vaping status: Never Used  Substance and Sexual Activity   Alcohol use: No   Drug use: No   Sexual activity: Yes  Other Topics Concern   Not on file  Social History Narrative   Not on file   Social Drivers of Health   Financial Resource Strain: Not on file  Food Insecurity: No Food Insecurity (04/18/2023)   Hunger Vital Sign    Worried  About Running Out of Food in the Last Year: Never true    Ran Out of Food in the Last Year: Never true  Transportation Needs: No Transportation Needs (04/18/2023)   PRAPARE - Administrator, Civil Service (Medical): No    Lack of Transportation (Non-Medical): No  Physical Activity: Not on file  Stress: Not on file  Social Connections: Unknown (09/04/2021)   [facility]   Social Network    Social Network: Not on file   Family History  Problem Relation Age of Onset   Diabetes Mother    - negative except otherwise stated in the family history section Allergies  Allergen Reactions   Peanut-Containing Drug Products Itching and Rash    Not all peanuts trigger the allergic reaction of itching in the throat and ears   Prior to Admission medications   Medication Sig Start Date End Date Taking? Authorizing Provider  insulin aspart (NOVOLOG) 100 UNIT/ML injection Inject 0.8 Units/hr into the skin See admin instructions. Use in insulin pump 0.6 units/hr plus boluses of 0-10 units for meals.   Yes [provider]  lisinopril (PRINIVIL,ZESTRIL) 5 MG tablet Take 5 mg by mouth daily.   Yes [provider]  lovastatin (MEVACOR) 20 MG tablet Take 20 mg by mouth daily.   Yes [provider]  pantoprazole (PROTONIX) 40 MG tablet Take 1 tablet (40 mg total)  by mouth 2 (two) times daily. Patient not taking: Reported on 04/18/2023 08/22/20   Catarina Hartshorn, MD  sucralfate (CARAFATE) 1 GM/10ML suspension Take 10 mLs (1 g total) by mouth 4 (four) times daily -  with meals and at bedtime. Patient not taking: Reported on 04/18/2023 08/22/20   Catarina Hartshorn, MD   MRI Left foot without contrast Result Date: 04/19/2023 CLINICAL DATA:  Osteonecrosis suspected, foot, xray done History of diabetes with 2nd toe ulceration. Clinical concern for osteomyelitis. EXAM: MRI OF THE LEFT FOOT WITHOUT CONTRAST TECHNIQUE: Multiplanar, multisequence MR imaging of the left forefoot  was performed. No intravenous contrast was administered. COMPARISON:  Radiographs 04/17/2023. FINDINGS: Bones/Joint/Cartilage Soft tissue swelling and soft tissue emphysema throughout the 2nd toe appear progressive from recent radiographs. There are underlying marrow changes within the head of the 2nd proximal phalanx and throughout the 2nd middle and distal phalanges with mildly increased T2 and decreased T1 signal. There are possible small foci of intraosseous air within these phalanges. No gross cortical destruction. No significant associated interphalangeal joint effusions. No other significant osseous abnormalities are identified within the left forefoot. There are small nonspecific effusions of the 1st and 2nd metatarsophalangeal joints. The tibial sesamoid of the 1st metatarsal appears bipartite. Ligaments Intact Lisfranc ligament. The collateral ligaments of the metatarsophalangeal joints appear intact. Muscles and Tendons Mild T2 hyperintensity throughout the forefoot musculature, likely related to diabetes. The forefoot tendons appear intact, without significant tenosynovitis. Soft tissues As above, soft tissue swelling and soft tissue emphysema throughout the 2nd toe, which appears increased from recent radiographs, suspicious for necrotizing soft tissue infection. No focal fluid collections are identified. IMPRESSION: 1. Progressive soft tissue swelling and soft tissue emphysema throughout the 2nd toe, suspicious for necrotizing soft tissue infection. No focal abscess. 2. Marrow changes within the head of the 2nd proximal phalanx and throughout the 2nd middle and distal phalanges, suspicious for early osteomyelitis. No gross cortical destruction. 3. No other significant osseous abnormalities identified. 4. Small nonspecific effusions of the 1st and 2nd metatarsophalangeal joints. Electronically Signed   By: Carey Bullocks M.D.   On: 04/19/2023 08:07   - pertinent xrays, CT, MRI studies were reviewed  and independently interpreted  Positive ROS: All other systems have been reviewed and were otherwise negative with the exception of those mentioned in the HPI and as above.  Physical Exam: General: Alert, no acute distress Psychiatric: Patient is competent for consent with normal mood and affect Lymphatic: No axillary or cervical lymphadenopathy Cardiovascular: No pedal edema Respiratory: No cyanosis, no use of accessory musculature GI: No organomegaly, abdomen is soft and non-tender    Images:  @ENCIMAGES @  Labs:  Lab Results  Component Value Date   HGBA1C 9.6 (H) 04/19/2023   HGBA1C 9.2 (H) 08/19/2020   HGBA1C 10.9 (H) 12/01/2017   ESRSEDRATE 80 (H) 04/18/2023   CRP 21.3 (H) 04/18/2023   REPTSTATUS PENDING 04/18/2023   CULT  04/18/2023    NO GROWTH < 12 HOURS Performed at [facility], [address], [place], [place] [address]     Lab Results  Component Value Date   ALBUMIN 2.9 (L) 04/18/2023   ALBUMIN 3.5 08/21/2020   ALBUMIN 3.4 (L) 08/20/2020   PREALBUMIN <5 (L) 04/19/2023        Latest Ref Rng & Units 04/19/2023    6:20 AM 04/18/2023    8:50 PM 08/22/2020    4:19 AM  CBC EXTENDED  WBC 4.0 - 10.5 K/uL 23.0  27.5  11.5  RBC 4.22 - 5.81 MIL/uL 4.05  4.42  4.20   Hemoglobin 13.0 - 17.0 g/dL 40.9  81.1  91.4   HCT 39.0 - 52.0 % 32.9  36.9  36.8   Platelets 150 - 400 K/uL 417  443  217   NEUT# 1.7 - 7.7 K/uL  23.4  7.9   Lymph# 0.7 - 4.0 K/uL  1.8  2.4     Neurologic: Patient does not have protective sensation bilateral lower extremities.   MUSCULOSKELETAL:   Skin: Examination patient has purulent drainage from the left second toe.  The toe was gangrenous up to the MTP joint.  Patient has a strong palpable dorsalis pedis pulse.  Radiographs show air in the soft tissue.   White cell count 23 with a hemoglobin of 10.7.  Albumin 2.9 and a hemoglobin A1c of 9.6.  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  Plan: Will plan for a left foot second ray amputation tomorrow morning.  Risk and benefits were discussed including need for additional surgery.  Patient states he understands wished to proceed at this time.  Thank you for the consult and the opportunity to see Mr. Saturnino Curling, MD [facility] [phone] 6:07 PM

## 2023-04-19 NOTE — Progress Notes (Signed)
Pt states insulin pump is low on insulin and does not have attached meter for CBG reading. Provider informed and CBG taken with reading of 47. Standings orders for NPO placed, dextrose 5 given IV. CBG rechecked and reading of 180 15 minutes later. Pt asymptomatic at this time.

## 2023-04-19 NOTE — Inpatient Diabetes Management (Addendum)
Inpatient Diabetes Program Recommendations  AACE/ADA: New Consensus Statement on Inpatient Glycemic Control (2015)  Target Ranges:  Prepandial:   less than 140 mg/dL      Peak postprandial:   less than 180 mg/dL (1-2 hours)      Critically ill patients:  140 - 180 mg/dL    Latest Reference Range & Units 04/19/23 01:31 04/19/23 10:34 04/19/23 10:58 04/19/23 11:49  Glucose-Capillary 70 - 99 mg/dL 962 (H) 47 (L) 952 (H) 152 (H)   Review of Glycemic Control  Diabetes history: DM1 Outpatient Diabetes medications: Medtronic 770G  insulin pump with Novolog Current orders for Inpatient glycemic control: Semglee 10 units BID, Novolog 0-15 units Q4H; Insulin Pump Q4H  Inpatient Diabetes Program Recommendations:    Insulin Pump: Patient removed his insulin pump this morning and has orders for SQ insulin regimen. Discontinued insulin pump orders.   Insulin: Will likely need to order Novolog meal coverage insulin if patient is eating well.  Outpatient DM: At time of discharge, please provide Rx for Guardian transmitter [phone]) so patient can resume using Guardian CGM.  NOTE: Diabetes coordinator working remotely.  Per PCP note on 09/12/22, A1C 10.8%; pt using Medtronic 770G and settings are: Basal: 1.15 units/hour, I:CR 1:17 grams; I:SF 1:50 mg/dl.  Spoke with patient over the phone regarding diabetes and home regimen for diabetes management.  Patient states that he uses a Medtronic insulin pump with Novolog insulin as an outpatient. Patient has insulin pump in room but notes that he disconnected it this morning and the nurse informed him that the doctor has ordered insulin injections.  Patient does not know pump settings and notes he does not have any extra insulin pump supplies at the hospital. Patient reports that he was using a Guardian CGM but he needs a new transmitter (reports his dog chewed his old one up).  Patient has been doing CBGs at home for glucose monitoring. Discussed that he is now  ordered Semglee and Novolog insulin for glycemic control.  Patient states that he was fine with using SQ insulin regime while inpatient.  Patient asked for Rx for Guardian transmitter; will ask attending to prescribe at discharge. Encouraged patient to follow up with PCP.  Patient verbalized understanding of information discussed and states that he does not have any further questions related to diabetes at this time.  Thanks, Orlando Penner, RN, MSN, CDCES Diabetes Coordinator Inpatient Diabetes Program [phone] (Team Pager from 8am to 5pm)

## 2023-04-19 NOTE — Plan of Care (Signed)
  Problem: Activity: Goal: Risk for activity intolerance will decrease Outcome: Progressing   Problem: Nutrition: Goal: Adequate nutrition will be maintained Outcome: Progressing   Problem: Coping: Goal: Level of anxiety will decrease Outcome: Progressing   

## 2023-04-19 NOTE — Consult Note (Signed)
ORTHOPAEDIC CONSULTATION  REQUESTING PHYSICIAN: Marinda Elk, MD  Chief Complaint: Necrotic gangrene right foot second toe.  HPI: Danny Vargas is a 52 y.o. male who presents with diabetic insensate neuropathy with a gangrenous necrotic left second toe.  Patient states he has had ulceration for about 2 months.  Patient states he has not smoked in a while.  Patient states he went to [facility] 2 days ago and was told that he did not have a bone infection in the second toe and he was discharged to home.  I was called by podiatry last evening and reviewed the patient's clinical findings and recommended urgent admission to [facility] for IV antibiotics and surgical treatment.  Past Medical History:  Diagnosis Date   Diabetes mellitus    GERD (gastroesophageal reflux disease)    HLD (hyperlipidemia) 12/14/2017   HTN (hypertension) 12/14/2017   Marijuana abuse 12/14/2017   Past Surgical History:  Procedure Laterality Date   ESOPHAGOGASTRODUODENOSCOPY (EGD) WITH PROPOFOL N/A 12/16/2017   Procedure: ESOPHAGOGASTRODUODENOSCOPY (EGD) WITH PROPOFOL;  Surgeon: Carman Ching, MD;  Location: [facility] ENDOSCOPY;  Service: Endoscopy;  Laterality: N/A;   KNEE SURGERY     Social History   Socioeconomic History   Marital status: Single    Spouse name: Not on file   Number of children: Not on file   Years of education: Not on file   Highest education level: Not on file  Occupational History   Not on file  Tobacco Use   Smoking status: Every Day    Current packs/day: 1.00    Types: Cigarettes   Smokeless tobacco: Never  Vaping Use   Vaping status: Never Used  Substance and Sexual Activity   Alcohol use: No   Drug use: No   Sexual activity: Yes  Other Topics Concern   Not on file  Social History Narrative   Not on file   Social Drivers of Health   Financial Resource Strain: Not on file  Food Insecurity: No Food Insecurity (04/18/2023)   Hunger Vital Sign    Worried  About Running Out of Food in the Last Year: Never true    Ran Out of Food in the Last Year: Never true  Transportation Needs: No Transportation Needs (04/18/2023)   PRAPARE - Administrator, Civil Service (Medical): No    Lack of Transportation (Non-Medical): No  Physical Activity: Not on file  Stress: Not on file  Social Connections: Unknown (09/04/2021)   [facility]   Social Network    Social Network: Not on file   Family History  Problem Relation Age of Onset   Diabetes Mother    - negative except otherwise stated in the family history section Allergies  Allergen Reactions   Peanut-Containing Drug Products Itching and Rash    Not all peanuts trigger the allergic reaction of itching in the throat and ears   Prior to Admission medications   Medication Sig Start Date End Date Taking? Authorizing Provider  insulin aspart (NOVOLOG) 100 UNIT/ML injection Inject 0.8 Units/hr into the skin See admin instructions. Use in insulin pump 0.6 units/hr plus boluses of 0-10 units for meals.   Yes [provider]  lisinopril (PRINIVIL,ZESTRIL) 5 MG tablet Take 5 mg by mouth daily.   Yes [provider]  lovastatin (MEVACOR) 20 MG tablet Take 20 mg by mouth daily.   Yes [provider]  pantoprazole (PROTONIX) 40 MG tablet Take 1 tablet (40 mg total)  by mouth 2 (two) times daily. Patient not taking: Reported on 04/18/2023 08/22/20   Catarina Hartshorn, MD  sucralfate (CARAFATE) 1 GM/10ML suspension Take 10 mLs (1 g total) by mouth 4 (four) times daily -  with meals and at bedtime. Patient not taking: Reported on 04/18/2023 08/22/20   Catarina Hartshorn, MD   MRI Left foot without contrast Result Date: 04/19/2023 CLINICAL DATA:  Osteonecrosis suspected, foot, xray done History of diabetes with 2nd toe ulceration. Clinical concern for osteomyelitis. EXAM: MRI OF THE LEFT FOOT WITHOUT CONTRAST TECHNIQUE: Multiplanar, multisequence MR imaging of the left forefoot  was performed. No intravenous contrast was administered. COMPARISON:  Radiographs 04/17/2023. FINDINGS: Bones/Joint/Cartilage Soft tissue swelling and soft tissue emphysema throughout the 2nd toe appear progressive from recent radiographs. There are underlying marrow changes within the head of the 2nd proximal phalanx and throughout the 2nd middle and distal phalanges with mildly increased T2 and decreased T1 signal. There are possible small foci of intraosseous air within these phalanges. No gross cortical destruction. No significant associated interphalangeal joint effusions. No other significant osseous abnormalities are identified within the left forefoot. There are small nonspecific effusions of the 1st and 2nd metatarsophalangeal joints. The tibial sesamoid of the 1st metatarsal appears bipartite. Ligaments Intact Lisfranc ligament. The collateral ligaments of the metatarsophalangeal joints appear intact. Muscles and Tendons Mild T2 hyperintensity throughout the forefoot musculature, likely related to diabetes. The forefoot tendons appear intact, without significant tenosynovitis. Soft tissues As above, soft tissue swelling and soft tissue emphysema throughout the 2nd toe, which appears increased from recent radiographs, suspicious for necrotizing soft tissue infection. No focal fluid collections are identified. IMPRESSION: 1. Progressive soft tissue swelling and soft tissue emphysema throughout the 2nd toe, suspicious for necrotizing soft tissue infection. No focal abscess. 2. Marrow changes within the head of the 2nd proximal phalanx and throughout the 2nd middle and distal phalanges, suspicious for early osteomyelitis. No gross cortical destruction. 3. No other significant osseous abnormalities identified. 4. Small nonspecific effusions of the 1st and 2nd metatarsophalangeal joints. Electronically Signed   By: Carey Bullocks M.D.   On: 04/19/2023 08:07   - pertinent xrays, CT, MRI studies were reviewed  and independently interpreted  Positive ROS: All other systems have been reviewed and were otherwise negative with the exception of those mentioned in the HPI and as above.  Physical Exam: General: Alert, no acute distress Psychiatric: Patient is competent for consent with normal mood and affect Lymphatic: No axillary or cervical lymphadenopathy Cardiovascular: No pedal edema Respiratory: No cyanosis, no use of accessory musculature GI: No organomegaly, abdomen is soft and non-tender    Images:  @ENCIMAGES @  Labs:  Lab Results  Component Value Date   HGBA1C 9.6 (H) 04/19/2023   HGBA1C 9.2 (H) 08/19/2020   HGBA1C 10.9 (H) 12/01/2017   ESRSEDRATE 80 (H) 04/18/2023   CRP 21.3 (H) 04/18/2023   REPTSTATUS PENDING 04/18/2023   CULT  04/18/2023    NO GROWTH < 12 HOURS Performed at [facility], [address], [place], [place] [address]     Lab Results  Component Value Date   ALBUMIN 2.9 (L) 04/18/2023   ALBUMIN 3.5 08/21/2020   ALBUMIN 3.4 (L) 08/20/2020   PREALBUMIN <5 (L) 04/19/2023        Latest Ref Rng & Units 04/19/2023    6:20 AM 04/18/2023    8:50 PM 08/22/2020    4:19 AM  CBC EXTENDED  WBC 4.0 - 10.5 K/uL 23.0  27.5  11.5  RBC 4.22 - 5.81 MIL/uL 4.05  4.42  4.20   Hemoglobin 13.0 - 17.0 g/dL 40.9  81.1  91.4   HCT 39.0 - 52.0 % 32.9  36.9  36.8   Platelets 150 - 400 K/uL 417  443  217   NEUT# 1.7 - 7.7 K/uL  23.4  7.9   Lymph# 0.7 - 4.0 K/uL  1.8  2.4     Neurologic: Patient does not have protective sensation bilateral lower extremities.   MUSCULOSKELETAL:   Skin: Examination patient has purulent drainage from the left second toe.  The toe was gangrenous up to the MTP joint.  Patient has a strong palpable dorsalis pedis pulse.  Radiographs show air in the soft tissue.   White cell count 23 with a hemoglobin of 10.7.  Albumin 2.9 and a hemoglobin A1c of 9.6.  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  Plan: Will plan for a left foot second ray amputation tomorrow morning.  Risk and benefits were discussed including need for additional surgery.  Patient states he understands wished to proceed at this time.  Thank you for the consult and the opportunity to see Mr. Saturnino Curling, MD [facility] [phone] 6:07 PM

## 2023-04-19 NOTE — Progress Notes (Signed)
Initial Nutrition Assessment  DOCUMENTATION CODES:   Severe malnutrition in context of chronic illness  INTERVENTION:   Once appropriate for PO intake:  - Recommend Ensure Enlive 2x daily w/ meals, if PO intake remains inadequate post surgery - Recommend 1 packet Juven BID  - Discussed importance of protein intake as it relates to wound healing and blood sugar management  NUTRITION DIAGNOSIS:   Severe Malnutrition related to chronic illness as evidenced by severe fat depletion, severe muscle depletion, percent weight loss.   GOAL:   Patient will meet greater than or equal to 90% of their needs   MONITOR:   PO intake, Supplement acceptance, Weight trends  REASON FOR ASSESSMENT:   Consult Wound healing  ASSESSMENT:   Patient presented to ED on 12/19 from podiatry for evaluation of infection to L 2nd toe. PMH: HTN, HLD, GERD T1DM  Currently NPO r/t possibility for impending amputation, however states his appetite has been diminished for last 1-3 months 2/2 gastroparesis. He does not measure blood sugars regularly at home, but endorses they range from 180-220mg /dL in the mornings when he does take them. He states his CGM transmitter needs replacing.   24 Hour Recall: B: eggs, bacon, coffee L: Leftovers from night before D: Out to eat usually (protein, starch, veg)  Discussed importance of protein intake to stabilize blood sugars as well as promote wound healing s/p amputation, if it is to occur and promote weight gain. He is amicable to protein supplement and Juven s/p surgery, if needed. If appetite adequate s/p surgery, he could likely meet calorie needs with diet alone, but possibly add a prosource to amp up protein. Also reports he wants to return to gym to rebuild muscle upon discharge.    Admission Weight: 65.8kg  UBW reported as 156 pounds three months ago. This equates to 7.6% wt loss over last three months. He endorses experiencing significant N/V due to  gastroparesis flare.   Labs:  WBC 23 <-- 27.5 CRP 21.3 A1c 9.6 CBGs 166-179  Meds: vancomycin, metronidazole, cefepime, hydromorphone, insulin pump (at home, currently disconnected), insulin SSI, Semglee  NUTRITION - FOCUSED PHYSICAL EXAM:  Flowsheet Row Most Recent Value  Orbital Region Mild depletion  Upper Arm Region Severe depletion  Thoracic and Lumbar Region Severe depletion  Buccal Region Moderate depletion  Temple Region Moderate depletion  Clavicle Bone Region Moderate depletion  Clavicle and Acromion Bone Region Severe depletion  Scapular Bone Region Severe depletion  Dorsal Hand Severe depletion  Patellar Region Moderate depletion  Anterior Thigh Region Moderate depletion  Posterior Calf Region Moderate depletion  Edema (RD Assessment) None  Hair Reviewed  Eyes Reviewed  Mouth Reviewed  Skin Reviewed  Nails Reviewed       Diet Order:   Diet Order             Diet NPO time specified Except for: Sips with Meds, Ice Chips  Diet effective midnight                   EDUCATION NEEDS:   Education needs have been addressed  Skin:  Skin Assessment: Skin Integrity Issues: Skin Integrity Issues::  (grangrene to L second toe)  Last BM:  12/17  Height:   Ht Readings from Last 1 Encounters:  04/18/23 6\' 2"  (1.88 m)    Weight:   Wt Readings from Last 1 Encounters:  04/18/23 65.8 kg    Ideal Body Weight:  86.3 kg  BMI:  Body mass index is 18.62 kg/m.  Estimated Nutritional Needs:   Kcal:  2300-2500kcal  Protein:  105-115g  Fluid:  >2L   Myrtie Cruise MS, RD, LDN Registered Dietitian Clinical Nutrition RD Inpatient Contact Info in Amion

## 2023-04-20 ENCOUNTER — Encounter (HOSPITAL_COMMUNITY)
Admission: EM | Disposition: A | Discharge status: Home / Self Care | Payer: Self-pay | Source: Home / Self Care | Attending: Internal Medicine

## 2023-04-20 ENCOUNTER — Encounter (HOSPITAL_COMMUNITY): Payer: Self-pay | Admitting: Family Medicine

## 2023-04-20 ENCOUNTER — Inpatient Hospital Stay (HOSPITAL_COMMUNITY): Payer: BC Managed Care – PPO

## 2023-04-20 ENCOUNTER — Other Ambulatory Visit: Payer: Self-pay

## 2023-04-20 DIAGNOSIS — E1165 Type 2 diabetes mellitus with hyperglycemia: Secondary | ICD-10-CM | POA: Diagnosis not present

## 2023-04-20 DIAGNOSIS — I96 Gangrene, not elsewhere classified: Secondary | ICD-10-CM | POA: Diagnosis not present

## 2023-04-20 DIAGNOSIS — M86172 Other acute osteomyelitis, left ankle and foot: Secondary | ICD-10-CM | POA: Insufficient documentation

## 2023-04-20 DIAGNOSIS — Z794 Long term (current) use of insulin: Secondary | ICD-10-CM | POA: Diagnosis not present

## 2023-04-20 HISTORY — PX: AMPUTATION TOE: SHX6595

## 2023-04-20 LAB — BASIC METABOLIC PANEL
Anion gap: 9 (ref 5–15)
BUN: 8 mg/dL (ref 6–20)
CO2: 21 mmol/L — ABNORMAL LOW (ref 22–32)
Calcium: 8.8 mg/dL — ABNORMAL LOW (ref 8.9–10.3)
Chloride: 110 mmol/L (ref 98–111)
Creatinine, Ser: 0.89 mg/dL (ref 0.61–1.24)
GFR, Estimated: >60 mL/min (ref >60)
Glucose, Bld: 159 mg/dL — ABNORMAL HIGH (ref 70–99)
Potassium: 3.9 mmol/L (ref 3.5–5.1)
Sodium: 140 mmol/L (ref 135–145)

## 2023-04-20 LAB — GLUCOSE, CAPILLARY
Glucose-Capillary: 143 mg/dL — ABNORMAL HIGH (ref 70–99)
Glucose-Capillary: 148 mg/dL — ABNORMAL HIGH (ref 70–99)
Glucose-Capillary: 216 mg/dL — ABNORMAL HIGH (ref 70–99)
Glucose-Capillary: 301 mg/dL — ABNORMAL HIGH (ref 70–99)
Glucose-Capillary: 79 mg/dL (ref 70–99)
Glucose-Capillary: 130 mg/dL — ABNORMAL HIGH (ref 70–99)

## 2023-04-20 LAB — SURGICAL PCR SCREEN
MRSA, PCR: NEGATIVE
Staphylococcus aureus: NEGATIVE

## 2023-04-20 LAB — CBC
HCT: 34.2 % — ABNORMAL LOW (ref 39.0–52.0)
Hemoglobin: 11 g/dL — ABNORMAL LOW (ref 13.0–17.0)
MCH: 26.6 pg (ref 26.0–34.0)
MCHC: 32.2 g/dL (ref 30.0–36.0)
MCV: 82.6 fL (ref 80.0–100.0)
Platelets: 493 10*3/uL — ABNORMAL HIGH (ref 150–400)
RBC: 4.14 MIL/uL — ABNORMAL LOW (ref 4.22–5.81)
RDW: 15.2 % (ref 11.5–15.5)
WBC: 19.2 10*3/uL — ABNORMAL HIGH (ref 4.0–10.5)
nRBC: 0 % (ref 0.0–0.2)

## 2023-04-20 SURGERY — AMPUTATION, TOE
Anesthesia: Monitor Anesthesia Care | Site: Toe | Laterality: Left

## 2023-04-20 MED ORDER — MIDAZOLAM HCL 2 MG/2ML IJ SOLN
INTRAMUSCULAR | Status: AC
  Administered 2023-04-20: 1 mg via INTRAVENOUS
  Filled 2023-04-20: qty 2

## 2023-04-20 MED ORDER — PROPOFOL 10 MG/ML IV BOLUS
INTRAVENOUS | Status: AC
  Filled 2023-04-20: qty 20

## 2023-04-20 MED ORDER — MIDAZOLAM HCL 2 MG/2ML IJ SOLN: 1.0000 mg | Freq: Once | INTRAMUSCULAR | Status: AC

## 2023-04-20 MED ORDER — ONDANSETRON HCL 4 MG/2ML IJ SOLN
INTRAMUSCULAR | Status: AC
  Filled 2023-04-20: qty 2

## 2023-04-20 MED ORDER — CEFAZOLIN SODIUM-DEXTROSE 2-4 GM/100ML-% IV SOLN
INTRAVENOUS | Status: AC
  Filled 2023-04-20: qty 100

## 2023-04-20 MED ORDER — FENTANYL CITRATE (PF) 100 MCG/2ML IJ SOLN: 25.0000 ug | INTRAMUSCULAR | Status: DC | PRN

## 2023-04-20 MED ORDER — KETOROLAC TROMETHAMINE 15 MG/ML IJ SOLN
INTRAMUSCULAR | Status: DC | PRN
  Administered 2023-04-20: 15 mg via INTRAVENOUS

## 2023-04-20 MED ORDER — ORAL CARE MOUTH RINSE
15.0000 mL | Freq: Once | OROMUCOSAL | Status: AC
Start: 2023-04-20 — End: 2023-04-20

## 2023-04-20 MED ORDER — INSULIN ASPART 100 UNIT/ML IJ SOLN
4.0000 [IU] | Freq: Three times a day (TID) | INTRAMUSCULAR | Status: DC
  Administered 2023-04-20 – 2023-04-22 (×4): 4 [IU] via SUBCUTANEOUS

## 2023-04-20 MED ORDER — PHENYLEPHRINE HCL-NACL 20-0.9 MG/250ML-% IV SOLN
INTRAVENOUS | Status: AC
  Filled 2023-04-20: qty 250

## 2023-04-20 MED ORDER — VITAMIN C 500 MG PO TABS
1000.0000 mg | ORAL_TABLET | Freq: Every day | ORAL | Status: DC
  Administered 2023-04-20 – 2023-04-23 (×4): 1000 mg via ORAL
  Filled 2023-04-20 (×4): qty 2

## 2023-04-20 MED ORDER — JUVEN PO PACK
1.0000 | PACK | Freq: Two times a day (BID) | ORAL | Status: DC
  Administered 2023-04-21 – 2023-04-23 (×6): 1 via ORAL
  Filled 2023-04-20 (×6): qty 1

## 2023-04-20 MED ORDER — KETOROLAC TROMETHAMINE 30 MG/ML IJ SOLN
INTRAMUSCULAR | Status: AC
  Filled 2023-04-20: qty 1

## 2023-04-20 MED ORDER — DEXMEDETOMIDINE HCL IN NACL 80 MCG/20ML IV SOLN
INTRAVENOUS | Status: AC
  Filled 2023-04-20: qty 20

## 2023-04-20 MED ORDER — ZINC SULFATE 220 (50 ZN) MG PO CAPS
220.0000 mg | ORAL_CAPSULE | Freq: Every day | ORAL | Status: DC
  Administered 2023-04-20 – 2023-04-23 (×4): 220 mg via ORAL
  Filled 2023-04-20 (×5): qty 1

## 2023-04-20 MED ORDER — FENTANYL CITRATE (PF) 100 MCG/2ML IJ SOLN: 50.0000 ug | Freq: Once | INTRAMUSCULAR | Status: AC

## 2023-04-20 MED ORDER — ONDANSETRON HCL 4 MG/2ML IJ SOLN: 4.0000 mg | Freq: Four times a day (QID) | INTRAMUSCULAR | Status: DC | PRN

## 2023-04-20 MED ORDER — PROPOFOL 1000 MG/100ML IV EMUL
INTRAVENOUS | Status: AC
  Filled 2023-04-20: qty 200

## 2023-04-20 MED ORDER — FENTANYL CITRATE (PF) 100 MCG/2ML IJ SOLN
INTRAMUSCULAR | Status: AC
  Administered 2023-04-20: 50 ug via INTRAVENOUS
  Filled 2023-04-20: qty 2

## 2023-04-20 MED ORDER — PROPOFOL 10 MG/ML IV BOLUS
INTRAVENOUS | Status: DC | PRN
  Administered 2023-04-20: 40 mg via INTRAVENOUS
  Administered 2023-04-20: 100 ug/kg/min via INTRAVENOUS
  Administered 2023-04-20: 40 mg via INTRAVENOUS
  Administered 2023-04-20: 60 mg via INTRAVENOUS

## 2023-04-20 MED ORDER — INSULIN ASPART 100 UNIT/ML IJ SOLN
0.0000 [IU] | Freq: Three times a day (TID) | INTRAMUSCULAR | Status: DC
  Administered 2023-04-20: 11 [IU] via SUBCUTANEOUS
  Administered 2023-04-21: 5 [IU] via SUBCUTANEOUS
  Administered 2023-04-21: 3 [IU] via SUBCUTANEOUS

## 2023-04-20 MED ORDER — BUPIVACAINE HCL (PF) 0.5 % IJ SOLN
INTRAMUSCULAR | Status: DC | PRN
  Administered 2023-04-20: 30 mL via PERINEURAL

## 2023-04-20 MED ORDER — LACTATED RINGERS IV SOLN: INTRAVENOUS | Status: DC

## 2023-04-20 MED ORDER — SODIUM CHLORIDE 0.9 % IV SOLN
INTRAVENOUS | Status: DC | PRN
Start: 2023-04-20 — End: 2023-04-20

## 2023-04-20 MED ORDER — LIDOCAINE 2% (20 MG/ML) 5 ML SYRINGE
INTRAMUSCULAR | Status: AC
  Filled 2023-04-20: qty 5

## 2023-04-20 MED ORDER — ACETAMINOPHEN 325 MG PO TABS: 325.0000 mg | ORAL_TABLET | Freq: Four times a day (QID) | ORAL | Status: DC | PRN

## 2023-04-20 MED ORDER — POLYETHYLENE GLYCOL 3350 17 G PO PACK
17.0000 g | PACK | Freq: Two times a day (BID) | ORAL | Status: AC
  Administered 2023-04-20 – 2023-04-21 (×3): 17 g via ORAL
  Filled 2023-04-20 (×4): qty 1

## 2023-04-20 MED ORDER — LISINOPRIL 5 MG PO TABS
5.0000 mg | ORAL_TABLET | Freq: Every day | ORAL | Status: DC
  Administered 2023-04-20 – 2023-04-23 (×4): 5 mg via ORAL
  Filled 2023-04-20 (×4): qty 1

## 2023-04-20 MED ORDER — SODIUM CHLORIDE 0.9 % IV SOLN: INTRAVENOUS | Status: DC

## 2023-04-20 MED ORDER — MIDAZOLAM HCL 2 MG/2ML IJ SOLN
INTRAMUSCULAR | Status: AC
  Filled 2023-04-20: qty 2

## 2023-04-20 MED ORDER — FENTANYL CITRATE (PF) 250 MCG/5ML IJ SOLN
INTRAMUSCULAR | Status: AC
  Filled 2023-04-20: qty 5

## 2023-04-20 MED ORDER — OXYCODONE HCL 5 MG PO TABS: 5.0000 mg | ORAL_TABLET | Freq: Once | ORAL | Status: DC | PRN

## 2023-04-20 MED ORDER — CHLORHEXIDINE GLUCONATE 0.12 % MT SOLN
OROMUCOSAL | Status: AC
  Filled 2023-04-20: qty 15

## 2023-04-20 MED ORDER — 0.9 % SODIUM CHLORIDE (POUR BTL) OPTIME
TOPICAL | Status: DC | PRN
  Administered 2023-04-20: 1000 mL

## 2023-04-20 MED ORDER — OXYCODONE HCL 5 MG/5ML PO SOLN: 5.0000 mg | Freq: Once | ORAL | Status: DC | PRN

## 2023-04-20 MED ORDER — INSULIN ASPART 100 UNIT/ML IJ SOLN
0.0000 [IU] | Freq: Every day | INTRAMUSCULAR | Status: DC
  Administered 2023-04-21: 2 [IU] via SUBCUTANEOUS

## 2023-04-20 MED ORDER — VASHE WOUND IRRIGATION OPTIME
TOPICAL | Status: DC | PRN
  Administered 2023-04-20: 34 [oz_av]

## 2023-04-20 MED ORDER — CHLORHEXIDINE GLUCONATE 0.12 % MT SOLN
15.0000 mL | Freq: Once | OROMUCOSAL | Status: AC
  Administered 2023-04-20: 15 mL via OROMUCOSAL

## 2023-04-20 MED ORDER — HYDROMORPHONE HCL 1 MG/ML IJ SOLN
0.5000 mg | INTRAMUSCULAR | Status: DC | PRN
  Administered 2023-04-20 – 2023-04-23 (×11): 0.5 mg via INTRAVENOUS
  Filled 2023-04-20 (×11): qty 0.5

## 2023-04-20 SURGICAL SUPPLY — 28 items
BAG COUNTER SPONGE SURGICOUNT (BAG) ×2 IMPLANT
BLADE SURG 21 STRL SS (BLADE) ×2 IMPLANT
BNDG COHESIVE 4X5 TAN STRL (GAUZE/BANDAGES/DRESSINGS) ×2 IMPLANT
BNDG ESMARK 4X9 LF (GAUZE/BANDAGES/DRESSINGS) IMPLANT
BNDG GAUZE DERMACEA FLUFF 4 (GAUZE/BANDAGES/DRESSINGS) ×2 IMPLANT
COVER SURGICAL LIGHT HANDLE (MISCELLANEOUS) ×4 IMPLANT
DRAPE U-SHAPE 47X51 STRL (DRAPES) ×2 IMPLANT
DRSG ADAPTIC 3X8 NADH LF (GAUZE/BANDAGES/DRESSINGS) IMPLANT
DURAPREP 26ML APPLICATOR (WOUND CARE) ×2 IMPLANT
ELECT REM PT RETURN 9FT ADLT (ELECTROSURGICAL) ×1
ELECTRODE REM PT RTRN 9FT ADLT (ELECTROSURGICAL) ×2 IMPLANT
GAUZE PAD ABD 8X10 STRL (GAUZE/BANDAGES/DRESSINGS) ×2 IMPLANT
GAUZE SPONGE 4X4 12PLY STRL (GAUZE/BANDAGES/DRESSINGS) IMPLANT
GLOVE BIOGEL PI IND STRL 9 (GLOVE) ×2 IMPLANT
GLOVE SURG ORTHO 9.0 STRL STRW (GLOVE) ×2 IMPLANT
GOWN STRL REUS W/ TWL XL LVL3 (GOWN DISPOSABLE) ×4 IMPLANT
GRAFT SKIN WND MICRO 38 (Tissue) IMPLANT
KIT BASIN OR (CUSTOM PROCEDURE TRAY) ×2 IMPLANT
KIT TURNOVER KIT B (KITS) ×2 IMPLANT
MANIFOLD NEPTUNE II (INSTRUMENTS) ×2 IMPLANT
NDL 22X1.5 STRL (OR ONLY) (MISCELLANEOUS) IMPLANT
NEEDLE 22X1.5 STRL (OR ONLY) (MISCELLANEOUS) ×1 IMPLANT
NS IRRIG 1000ML POUR BTL (IV SOLUTION) ×2 IMPLANT
PACK ORTHO EXTREMITY (CUSTOM PROCEDURE TRAY) ×2 IMPLANT
PAD ARMBOARD 7.5X6 YLW CONV (MISCELLANEOUS) ×4 IMPLANT
SUT ETHILON 2 0 PSLX (SUTURE) ×2 IMPLANT
SYR CONTROL 10ML LL (SYRINGE) IMPLANT
TOWEL GREEN STERILE (TOWEL DISPOSABLE) ×2 IMPLANT

## 2023-04-20 NOTE — Anesthesia Procedure Notes (Signed)
Anesthesia Regional Block: Popliteal block   Pre-Anesthetic Checklist: , timeout performed,  Correct Patient, Correct Site, Correct Laterality,  Correct Procedure, Correct Position, site marked,  Risks and benefits discussed,  Surgical consent,  Pre-op evaluation,  At surgeon's request and post-op pain management  Laterality: Left  Prep: chloraprep       Needles:  Injection technique: Single-shot  Needle Type: Echogenic Stimulator Needle          Additional Needles:   Procedures:, nerve stimulator,,,,,     Nerve Stimulator or Paresthesia:  Response: plantar flexion of foot, 0.45 mA  Additional Responses:   Narrative:  Start time: 04/20/2023 7:20 AM End time: 04/20/2023 7:25 AM Injection made incrementally with aspirations every 5 mL.  Performed by: Personally  Anesthesiologist: Achille Rich, MD  Additional Notes: Functioning IV was confirmed and monitors were applied.  A 90mm 21ga Arrow echogenic stimulator needle was used. Sterile prep and drape,hand hygiene and sterile gloves were used.  Negative aspiration and negative test dose prior to incremental administration of local anesthetic. The patient tolerated the procedure well.  Ultrasound guidance: relevent anatomy identified, needle position confirmed, local anesthetic spread visualized around nerve(s), vascular puncture avoided.  Image printed for medical record.

## 2023-04-20 NOTE — Anesthesia Procedure Notes (Signed)
Procedure Name: General with mask airway Date/Time: 04/20/2023 7:45 AM  Performed by: Debbe Odea, CRNAPre-anesthesia Checklist: Patient identified, Emergency Drugs available, Suction available, Patient being monitored and Timeout performed Patient Re-evaluated:Patient Re-evaluated prior to induction Oxygen Delivery Method: Simple face mask Induction Type: IV induction

## 2023-04-20 NOTE — Transfer of Care (Signed)
Immediate Anesthesia Transfer of Care Note  Patient: Danny Vargas Dec  Procedure(s) Performed: LEFT SECOND TOE AMPUTATION (Left: Toe)  Patient Location: PACU  Anesthesia Type:MAC  Level of Consciousness: awake and sedated  Airway & Oxygen Therapy: Patient Spontanous Breathing and Patient connected to nasal cannula oxygen  Post-op Assessment: Report given to RN and Post -op Vital signs reviewed and stable  Post vital signs: Reviewed and stable  Last Vitals:  Vitals Value Taken Time  BP 111/64 04/20/23 0823  Temp    Pulse 92 04/20/23 0824  Resp 15 04/20/23 0824  SpO2 96 % 04/20/23 0824  Vitals shown include unfiled device data.  Last Pain:  Vitals:   04/20/23 0709  TempSrc: Oral  PainSc:          Complications: No notable events documented.

## 2023-04-20 NOTE — Progress Notes (Signed)
IV antibiotics delayed because of existing IV infiltration and difficulty in new IV line placement.

## 2023-04-20 NOTE — Op Note (Signed)
04/20/2023  8:23 AM  PATIENT:  Danny Vargas    PRE-OPERATIVE DIAGNOSIS:  Gangranous left second toe  POST-OPERATIVE DIAGNOSIS:  Same  PROCEDURE:  LEFT SECOND RAY AMPUTATION Tissue sent for cultures. Application of Kerecis micro graft 38 cm. Vashe irrigation.  SURGEON:  Nadara Mustard, MD  PHYSICIAN ASSISTANT:None ANESTHESIA:   General  PREOPERATIVE INDICATIONS:  [name] is a  52 y.o. male with a diagnosis of Gangranous left second toe who failed conservative measures and elected for surgical management.    The risks benefits and alternatives were discussed with the patient preoperatively including but not limited to the risks of infection, bleeding, nerve injury, cardiopulmonary complications, the need for revision surgery, among others, and the patient was willing to proceed.  OPERATIVE IMPLANTS:   Implant Name Type Inv. Item Serial No. Manufacturer Lot No. LRB No. Used Action  GRAFT SKIN WND MICRO 38 - [id number] Tissue GRAFT SKIN WND MICRO 38  [employer] [id number] Left 1 Implanted    @ENCIMAGES @  OPERATIVE FINDINGS: Tissue margins showed involvement with the infection.  This tissue was sent for cultures.  OPERATIVE PROCEDURE: Patient was brought the operating room after undergoing a regional anesthetic.  After adequate levels anesthesia were obtained patient's left lower extremity was prepped using DuraPrep draped into a sterile field a timeout was called.  AV incision was made around the second ray and the second ray was resected through the shaft of the second metatarsal.  After ray resection the tissue margins were involved with the infection.  A rondure was used to further excise soft tissue back to grossly viable margins.  The wound was irrigated with Vashe irrigation.  The wound bed was filled with 38 cm of Kerecis micro graft.  The incision was closed using 2-0 nylon a sterile dressing was applied patient was taken the PACU in stable condition.   DISCHARGE  PLANNING:  Antibiotic duration: Continue current IV antibiotics at time of discharge discharge on oral antibiotics.  Anticipate patient could be discharged on doxycycline and I can adjust this pending culture sensitivities.  Weightbearing: Touchdown weightbearing on the left  Pain medication: Continue opioid pathway  Dressing care/ Wound VAC: Dry dressing reinforce as needed  Ambulatory devices: Walker or crutches  Discharge to: Anticipate discharge to home when safe with therapy.  Anticipate discharge to home with doxycycline empirically and I will adjust this when culture sensitivities are finalized.  Follow-up: In the office 1 week post operative.

## 2023-04-20 NOTE — Progress Notes (Signed)
[facility] PROGRESS NOTE    Progress Note  NAASON CUFFEE  ZOX:[id number] DOB: 1971-01-22 DOA: 04/18/2023 PCP: Teodoro Spray, MD     Brief Narrative:   CAVELL CANCEL is an 52 y.o. male past medical history of essential hypertension hyperlipidemia, history of left second toe ulcer for about 2 months, insulin-dependent diabetes mellitus who presents with a left second toe infection.  He relates he started getting edematous and discolored over the last several days.  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 Blood cultures have been sent Will start empirically on antibiotics Vanc, Flagyl and cefepime Orthopedic surgery was consulted s/p left second ray amputation. PT eval is pending.  Uncontrolled type 2 diabetes mellitus with hyperglycemia, with long-term current use of insulin  A1c on admission 9.6, He is currently on insulin pump. Start long acting insulin, plus SSI. On carb modified diet.  Essential HTN (hypertension) Resume lisinopril.     DVT prophylaxis: lovenox Family Communication:Son Status is: Inpatient Remains inpatient appropriate because: Left foot gangrene    Code Status:     Code Status Orders  (From admission, onward)           Start     Ordered   04/18/23 2238  Full code  Continuous       Question:  By:  Answer:  Consent: discussion documented in EHR   04/18/23 2239           Code Status History     Date Active Date Inactive Code Status Order ID Comments User Context   08/18/2020 1951 08/22/2020 2312 Full Code [id number]  Onnie Boer, MD ED   12/14/2017 0811 12/17/2017 1958 Full Code [id number]  Delaine Lame, MD Inpatient   12/01/2017 0235 12/01/2017 2121 Full Code [id number]  Clydie Braun, MD Inpatient   12/01/2017 0226 12/01/2017 0233 Full Code [id number]  Clydie Braun, MD Inpatient   10/28/2013 2148 11/01/2013 1930 Full Code  [id number]  Yevonne Pax, MD Inpatient         IV Access:   Peripheral IV   Procedures and diagnostic studies:   MRI Left foot without contrast Result Date: 04/19/2023 CLINICAL DATA:  Osteonecrosis suspected, foot, xray done History of diabetes with 2nd toe ulceration. Clinical concern for osteomyelitis. EXAM: MRI OF THE LEFT FOOT WITHOUT CONTRAST TECHNIQUE: Multiplanar, multisequence MR imaging of the left forefoot was performed. No intravenous contrast was administered. COMPARISON:  Radiographs 04/17/2023. FINDINGS: Bones/Joint/Cartilage Soft tissue swelling and soft tissue emphysema throughout the 2nd toe appear progressive from recent radiographs. There are underlying marrow changes within the head of the 2nd proximal phalanx and throughout the 2nd middle and distal phalanges with mildly increased T2 and decreased T1 signal. There are possible small foci of intraosseous air within these phalanges. No gross cortical destruction. No significant associated interphalangeal joint effusions. No other significant osseous abnormalities are identified within the left forefoot. There are small nonspecific effusions of the 1st and 2nd metatarsophalangeal joints. The tibial sesamoid of the 1st metatarsal appears bipartite. Ligaments Intact Lisfranc ligament. The collateral ligaments of the metatarsophalangeal joints appear intact. Muscles and Tendons Mild T2 hyperintensity throughout the forefoot musculature, likely related to diabetes. The forefoot tendons appear intact, without significant tenosynovitis. Soft tissues As above, soft tissue swelling and soft tissue emphysema throughout the 2nd toe, which appears increased from recent radiographs, suspicious for necrotizing soft tissue infection. No focal fluid collections are identified.  IMPRESSION: 1. Progressive soft tissue swelling and soft tissue emphysema throughout the 2nd toe, suspicious for necrotizing soft tissue infection. No focal abscess. 2.  Marrow changes within the head of the 2nd proximal phalanx and throughout the 2nd middle and distal phalanges, suspicious for early osteomyelitis. No gross cortical destruction. 3. No other significant osseous abnormalities identified. 4. Small nonspecific effusions of the 1st and 2nd metatarsophalangeal joints. Electronically Signed   By: Carey Bullocks M.D.   On: 04/19/2023 08:07     Medical Consultants:   None.   Subjective:    Ernie O Toutant no BM  Objective:    Vitals:   04/20/23 0830 04/20/23 0845 04/20/23 0859 04/20/23 0910  BP: 98/61 122/64 (!) 145/74 127/76  Pulse: 96 93 94 91  Resp: 20 16 15 20   Temp:  97.8 F (36.6 C)  98.5 F (36.9 C)  TempSrc:    Oral  SpO2: 96% 96% 96% 98%  Weight:      Height:       SpO2: 98 % O2 Flow Rate (L/min): 2 L/min   Intake/Output Summary (Last 24 hours) at 04/20/2023 0917 Last data filed at 04/20/2023 4401 Gross per 24 hour  Intake 250 ml  Output 650 ml  Net -400 ml   Filed Weights   04/18/23 2200 04/20/23 0657  Weight: 65.8 kg 65.8 kg    Exam: General exam: In no acute distress, follows following order in the room Respiratory system: Good air movement and clear to auscultation. Cardiovascular system: S1 & S2 heard, RRR. No JVD. Gastrointestinal system: Abdomen is nondistended, soft and nontender.  Extremities: Left foot is wrapped Psychiatry: Judgement and insight appear normal. Mood & affect appropriate.    Data Reviewed:    Labs: Basic Metabolic Panel: Recent Labs  Lab 04/18/23 2050 04/19/23 0620 04/20/23 0528  NA 140 142 140  K 3.7 3.7 3.9  CL 108 111 110  CO2 25 24 21*  GLUCOSE 179* 166* 159*  BUN 11 8 8   CREATININE 0.90 0.79 0.89  CALCIUM 9.3 8.9 8.8*   GFR Estimated Creatinine Clearance: 89.3 mL/min (by C-G formula based on SCr of 0.89 mg/dL). Liver Function Tests: Recent Labs  Lab 04/18/23 2050  AST 20  ALT 12  ALKPHOS 126  BILITOT 0.5  PROT 7.5  ALBUMIN 2.9*   No results for  input(s): "LIPASE", "AMYLASE" in the last 168 hours. No results for input(s): "AMMONIA" in the last 168 hours. Coagulation profile No results for input(s): "INR", "PROTIME" in the last 168 hours. COVID-19 Labs  Recent Labs    04/18/23 2050  CRP 21.3*    Lab Results  Component Value Date   SARSCOV2NAA NEGATIVE 08/18/2020    CBC: Recent Labs  Lab 04/18/23 2050 04/19/23 0620 04/20/23 0528  WBC 27.5* 23.0* 19.2*  NEUTROABS 23.4*  --   --   HGB 11.9* 10.7* 11.0*  HCT 36.9* 32.9* 34.2*  MCV 83.5 81.2 82.6  PLT 443* 417* 493*   Cardiac Enzymes: No results for input(s): "CKTOTAL", "CKMB", "CKMBINDEX", "TROPONINI" in the last 168 hours. BNP (last 3 results) No results for input(s): "PROBNP" in the last 8760 hours. CBG: Recent Labs  Lab 04/19/23 1925 04/19/23 2339 04/20/23 0413 04/20/23 0713 04/20/23 0824  GLUCAP 343* 224* 143* 148* 216*   D-Dimer: No results for input(s): "DDIMER" in the last 72 hours. Hgb A1c: Recent Labs    04/19/23 0620  HGBA1C 9.6*   Lipid Profile: No results for input(s): "CHOL", "HDL", "LDLCALC", "TRIG", "CHOLHDL", "  LDLDIRECT" in the last 72 hours. Thyroid function studies: No results for input(s): "TSH", "T4TOTAL", "T3FREE", "THYROIDAB" in the last 72 hours.  Invalid input(s): "FREET3" Anemia work up: No results for input(s): "VITAMINB12", "FOLATE", "FERRITIN", "TIBC", "IRON", "RETICCTPCT" in the last 72 hours. Sepsis Labs: Recent Labs  Lab 04/18/23 2046 04/18/23 2050 04/19/23 0620 04/20/23 0528  WBC  --  27.5* 23.0* 19.2*  LATICACIDVEN 1.5  --   --   --    Microbiology Recent Results (from the past 240 hours)  Blood Cultures x 2 sites     Status: None (Preliminary result)   Collection Time: 04/18/23  8:49 PM   Specimen: BLOOD LEFT FOREARM  Result Value Ref Range Status   Specimen Description BLOOD LEFT FOREARM  Final   Special Requests   Final    BOTTLES DRAWN AEROBIC AND ANAEROBIC Blood Culture results may not be optimal  due to an inadequate volume of blood received in culture bottles   Culture   Final    NO GROWTH 2 DAYS Performed at [facility], [address] [place], [place] [address]    Report Status PENDING  Incomplete  Blood Cultures x 2 sites     Status: None (Preliminary result)   Collection Time: 04/18/23 10:00 PM   Specimen: BLOOD  Result Value Ref Range Status   Specimen Description BLOOD BLOOD LEFT HAND  Final   Special Requests   Final    BOTTLES DRAWN AEROBIC AND ANAEROBIC Blood Culture results may not be optimal due to an inadequate volume of blood received in culture bottles   Culture   Final    NO GROWTH 2 DAYS Performed at [facility], [address], [place], [place] [address]    Report Status PENDING  Incomplete  Surgical pcr screen     Status: None   Collection Time: 04/20/23 12:47 AM   Specimen: Nasal Mucosa; Nasal Swab  Result Value Ref Range Status   MRSA, PCR NEGATIVE NEGATIVE Final   Staphylococcus aureus NEGATIVE NEGATIVE Final    Comment: (NOTE) The Xpert SA Assay (FDA approved for NASAL specimens in patients 9 years of age and older), is one component of a comprehensive surveillance program. It is not intended to diagnose infection nor to guide or monitor treatment. Performed at [facility], [address], [place], [place] [address]      Medications:    chlorhexidine       enoxaparin (LOVENOX) injection  40 mg Subcutaneous Q24H   insulin aspart  0-15 Units Subcutaneous Q4H   insulin glargine-yfgn  10 Units Subcutaneous BID   metroNIDAZOLE  500 mg Oral Q12H   pravastatin  20 mg Oral q1800   Continuous Infusions:  ceFEPime (MAXIPIME) IV 2 g (04/20/23 0323)   vancomycin 750 mg (04/19/23 2254)      LOS: 2 days   Marinda Elk  [facility]  04/20/2023, 9:17 AM

## 2023-04-20 NOTE — Interval H&P Note (Signed)
History and Physical Interval Note:  04/20/2023 7:38 AM  Danny Vargas  has presented today for surgery, with the diagnosis of Gangranous right second toe.  The various methods of treatment have been discussed with the patient and family. After consideration of risks, benefits and other options for treatment, the patient has consented to  Procedure(s): LEFT SECOND TOE AMPUTATION (Left) as a surgical intervention.  The patient's history has been reviewed, patient examined, no change in status, stable for surgery.  I have reviewed the patient's chart and labs.  Questions were answered to the patient's satisfaction.     Nadara Mustard

## 2023-04-20 NOTE — Plan of Care (Signed)
  Problem: Education: Goal: Knowledge of General Education information will improve Description: Including pain rating scale, medication(s)/side effects and non-pharmacologic comfort measures Outcome: Progressing   Problem: Activity: Goal: Risk for activity intolerance will decrease Outcome: Progressing   Problem: Nutrition: Goal: Adequate nutrition will be maintained Outcome: Progressing   Problem: Coping: Goal: Level of anxiety will decrease Outcome: Progressing   Problem: Elimination: Goal: Will not experience complications related to bowel motility Outcome: Progressing   Problem: Pain Management: Goal: General experience of comfort will improve Outcome: Progressing   Problem: Safety: Goal: Ability to remain free from injury will improve Outcome: Progressing

## 2023-04-20 NOTE — Anesthesia Preprocedure Evaluation (Signed)
Anesthesia Evaluation  Patient identified by MRN, date of birth, ID band Patient awake    Reviewed: Allergy & Precautions, H&P , NPO status , Patient's Chart, lab work & pertinent test results  Airway Mallampati: II   Neck ROM: full    Dental   Pulmonary Current Smoker   breath sounds clear to auscultation       Cardiovascular hypertension,  Rhythm:regular Rate:Normal     Neuro/Psych    GI/Hepatic ,GERD  ,,  Endo/Other  diabetes, Type 2    Renal/GU      Musculoskeletal   Abdominal   Peds  Hematology   Anesthesia Other Findings   Reproductive/Obstetrics                             Anesthesia Physical Anesthesia Plan  ASA: 2  Anesthesia Plan: MAC and Regional   Post-op Pain Management:    Induction: Intravenous  PONV Risk Score and Plan: 0 and Propofol infusion, Treatment may vary due to age or medical condition and Midazolam  Airway Management Planned: Simple Face Mask  Additional Equipment:   Intra-op Plan:   Post-operative Plan:   Informed Consent: I have reviewed the patients History and Physical, chart, labs and discussed the procedure including the risks, benefits and alternatives for the proposed anesthesia with the patient or authorized representative who has indicated his/her understanding and acceptance.     Dental advisory given  Plan Discussed with: CRNA, Anesthesiologist and Surgeon  Anesthesia Plan Comments:        Anesthesia Quick Evaluation

## 2023-04-20 NOTE — Progress Notes (Signed)
Dear Doctor:  This patient has been identified as a candidate for PICC for the following reason (s): IV therapy over 48 hours, drug pH or osmolality (causing phlebitis, infiltration in 24 hours), and poor veins/poor circulatory system (CHF, COPD, emphysema, diabetes, steroid use, IV drug abuse, etc.) If you agree, please write an order for the indicated device.  Thank you for supporting the early vascular access assessment program.

## 2023-04-20 NOTE — Anesthesia Postprocedure Evaluation (Signed)
Anesthesia Post Note  Patient: Danny Vargas Dec  Procedure(s) Performed: LEFT SECOND TOE AMPUTATION (Left: Toe)     Patient location during evaluation: PACU Anesthesia Type: Regional and MAC Level of consciousness: awake and alert Pain management: pain level controlled Vital Signs Assessment: post-procedure vital signs reviewed and stable Respiratory status: spontaneous breathing, nonlabored ventilation, respiratory function stable and patient connected to nasal cannula oxygen Cardiovascular status: stable and blood pressure returned to baseline Postop Assessment: no apparent nausea or vomiting Anesthetic complications: no   No notable events documented.  Last Vitals:  Vitals:   04/20/23 0859 04/20/23 0910  BP: (!) 145/74 127/76  Pulse: 94 91  Resp: 15 20  Temp:  36.9 C  SpO2: 96% 98%    Last Pain:  Vitals:   04/20/23 0919  TempSrc:   PainSc: 10-Worst pain ever                 Joniel Graumann S

## 2023-04-20 NOTE — Plan of Care (Signed)
  Problem: Activity: Goal: Risk for activity intolerance will decrease Outcome: Progressing   Problem: Safety: Goal: Ability to remain free from injury will improve Outcome: Progressing   Problem: Skin Integrity: Goal: Risk for impaired skin integrity will decrease Outcome: Progressing   

## 2023-04-21 ENCOUNTER — Inpatient Hospital Stay (HOSPITAL_COMMUNITY): Payer: BC Managed Care – PPO

## 2023-04-21 DIAGNOSIS — I96 Gangrene, not elsewhere classified: Secondary | ICD-10-CM

## 2023-04-21 LAB — GLUCOSE, CAPILLARY
Glucose-Capillary: 112 mg/dL — ABNORMAL HIGH (ref 70–99)
Glucose-Capillary: 103 mg/dL — ABNORMAL HIGH (ref 70–99)
Glucose-Capillary: 180 mg/dL — ABNORMAL HIGH (ref 70–99)
Glucose-Capillary: 209 mg/dL — ABNORMAL HIGH (ref 70–99)
Glucose-Capillary: 34 mg/dL — CL (ref 70–99)
Glucose-Capillary: 34 mg/dL — CL (ref 70–99)
Glucose-Capillary: 60 mg/dL — ABNORMAL LOW (ref 70–99)
Glucose-Capillary: 226 mg/dL — ABNORMAL HIGH (ref 70–99)

## 2023-04-21 LAB — CBC
HCT: 34.6 % — ABNORMAL LOW (ref 39.0–52.0)
Hemoglobin: 11.2 g/dL — ABNORMAL LOW (ref 13.0–17.0)
MCH: 26.7 pg (ref 26.0–34.0)
MCHC: 32.4 g/dL (ref 30.0–36.0)
MCV: 82.4 fL (ref 80.0–100.0)
Platelets: 489 10*3/uL — ABNORMAL HIGH (ref 150–400)
RBC: 4.2 MIL/uL — ABNORMAL LOW (ref 4.22–5.81)
RDW: 15.4 % (ref 11.5–15.5)
WBC: 21.4 10*3/uL — ABNORMAL HIGH (ref 4.0–10.5)
nRBC: 0 % (ref 0.0–0.2)

## 2023-04-21 LAB — BASIC METABOLIC PANEL
Anion gap: 9 (ref 5–15)
BUN: 10 mg/dL (ref 6–20)
CO2: 22 mmol/L (ref 22–32)
Calcium: 8.8 mg/dL — ABNORMAL LOW (ref 8.9–10.3)
Chloride: 110 mmol/L (ref 98–111)
Creatinine, Ser: 1 mg/dL (ref 0.61–1.24)
GFR, Estimated: >60 mL/min (ref >60)
Glucose, Bld: 115 mg/dL — ABNORMAL HIGH (ref 70–99)
Potassium: 4 mmol/L (ref 3.5–5.1)
Sodium: 141 mmol/L (ref 135–145)

## 2023-04-21 LAB — VAS US ABI WITH/WO TBI
Left ABI: 1.13
Right ABI: 1.12

## 2023-04-21 MED ORDER — DOXYCYCLINE HYCLATE 100 MG PO TABS: 100.0000 mg | ORAL_TABLET | Freq: Two times a day (BID) | ORAL | 0 refills | Status: DC

## 2023-04-21 MED ORDER — INFLUENZA VIRUS VACC SPLIT PF (FLUZONE) 0.5 ML IM SUSY
0.5000 mL | PREFILLED_SYRINGE | INTRAMUSCULAR | Status: AC
  Administered 2023-04-22: 0.5 mL via INTRAMUSCULAR
  Filled 2023-04-21: qty 0.5

## 2023-04-21 MED ORDER — INSULIN GLARGINE-YFGN 100 UNIT/ML ~~LOC~~ SOLN
5.0000 [IU] | Freq: Two times a day (BID) | SUBCUTANEOUS | Status: DC
  Administered 2023-04-21 – 2023-04-23 (×4): 5 [IU] via SUBCUTANEOUS
  Filled 2023-04-21 (×5): qty 0.05

## 2023-04-21 NOTE — Progress Notes (Signed)
[facility] PROGRESS NOTE    Progress Note  [name]  QMV:[id number] DOB: 1970-10-17 DOA: 04/18/2023 PCP: Teodoro Spray, MD     Brief Narrative:   Danny Vargas is an 52 y.o. male past medical history of essential hypertension hyperlipidemia, history of left second toe ulcer for about 2 months, insulin-dependent diabetes mellitus who presents with a left second toe infection.  He relates he started getting edematous and discolored over the last several days.  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 Blood cultures have been sent, have him negative till date. On admission started empirically on vanc, Flagyl and cefepime, antibiotics per orthopedic surgery. Orthopedic surgery was consulted s/p left second ray amputation on 04/20/2027. PT eval is pending.  Uncontrolled type 2 diabetes mellitus with hyperglycemia, with long-term current use of insulin  A1c on admission 9.6, At home he is on an insulin pump. Here he is on long-acting insulin plus sliding scale blood glucose seems to be fairly controlled.  Essential HTN (hypertension) Resume lisinopril.     DVT prophylaxis: lovenox Family Communication:Son Status is: Inpatient Remains inpatient appropriate because: Left foot gangrene    Code Status:     Code Status Orders  (From admission, onward)           Start     Ordered   04/18/23 2238  Full code  Continuous       Question:  By:  Answer:  Consent: discussion documented in EHR   04/18/23 2239           Code Status History     Date Active Date Inactive Code Status Order ID Comments User Context   08/18/2020 1951 08/22/2020 2312 Full Code [id number]  Onnie Boer, MD ED   12/14/2017 0811 12/17/2017 1958 Full Code [id number]  Delaine Lame, MD Inpatient   12/01/2017 0235 12/01/2017 2121 Full Code [id number]  Clydie Braun, MD Inpatient   12/01/2017  0226 12/01/2017 0233 Full Code [id number]  Clydie Braun, MD Inpatient   10/28/2013 2148 11/01/2013 1930 Full Code [id number]  Yevonne Pax, MD Inpatient         IV Access:   Peripheral IV   Procedures and diagnostic studies:   No results found.    Medical Consultants:   None.   Subjective:    Khaled Orland Dec had a bowel movement pain is controlled.  Objective:    Vitals:   04/20/23 0910 04/20/23 1809 04/20/23 2011 04/21/23 0447  BP: 127/76 110/65 (!) 101/58 128/70  Pulse: 91 (!) 103 100 89  Resp: 20  18 17   Temp: 98.5 F (36.9 C) 99 F (37.2 C) 99.1 F (37.3 C) 98.5 F (36.9 C)  TempSrc: Oral Oral Oral Oral  SpO2: 98% 99% 98% 97%  Weight:      Height:       SpO2: 97 % O2 Flow Rate (L/min): 2 L/min   Intake/Output Summary (Last 24 hours) at 04/21/2023 0803 Last data filed at 04/21/2023 0200 Gross per 24 hour  Intake 1150 ml  Output --  Net 1150 ml   Filed Weights   04/18/23 2200 04/20/23 0657  Weight: 65.8 kg 65.8 kg    Exam: General exam: In no acute distress. Respiratory system: Good air movement and clear to auscultation. Cardiovascular system: S1 & S2 heard, RRR. No JVD. Gastrointestinal system: Abdomen is nondistended, soft and nontender.  Extremities: No pedal edema.  Skin: No rashes, lesions or ulcers Psychiatry: Judgement and insight appear normal. Mood & affect appropriate.   Data Reviewed:    Labs: Basic Metabolic Panel: Recent Labs  Lab 04/18/23 2050 04/19/23 0620 04/20/23 0528 04/21/23 0612  NA 140 142 140 141  K 3.7 3.7 3.9 4.0  CL 108 111 110 110  CO2 25 24 21* 22  GLUCOSE 179* 166* 159* 115*  BUN 11 8 8 10   CREATININE 0.90 0.79 0.89 1.00  CALCIUM 9.3 8.9 8.8* 8.8*   GFR Estimated Creatinine Clearance: 79.5 mL/min (by C-G formula based on SCr of 1 mg/dL). Liver Function Tests: Recent Labs  Lab 04/18/23 2050  AST 20  ALT 12  ALKPHOS 126  BILITOT 0.5  PROT 7.5  ALBUMIN 2.9*   No results for input(s):  "LIPASE", "AMYLASE" in the last 168 hours. No results for input(s): "AMMONIA" in the last 168 hours. Coagulation profile No results for input(s): "INR", "PROTIME" in the last 168 hours. COVID-19 Labs  Recent Labs    04/18/23 2050  CRP 21.3*    Lab Results  Component Value Date   SARSCOV2NAA NEGATIVE 08/18/2020    CBC: Recent Labs  Lab 04/18/23 2050 04/19/23 0620 04/20/23 0528 04/21/23 0612  WBC 27.5* 23.0* 19.2* 21.4*  NEUTROABS 23.4*  --   --   --   HGB 11.9* 10.7* 11.0* 11.2*  HCT 36.9* 32.9* 34.2* 34.6*  MCV 83.5 81.2 82.6 82.4  PLT 443* 417* 493* 489*   Cardiac Enzymes: No results for input(s): "CKTOTAL", "CKMB", "CKMBINDEX", "TROPONINI" in the last 168 hours. BNP (last 3 results) No results for input(s): "PROBNP" in the last 8760 hours. CBG: Recent Labs  Lab 04/20/23 1211 04/20/23 1622 04/20/23 2142 04/21/23 0142 04/21/23 0559  GLUCAP 301* 79 130* 112* 103*   D-Dimer: No results for input(s): "DDIMER" in the last 72 hours. Hgb A1c: Recent Labs    04/19/23 0620  HGBA1C 9.6*   Lipid Profile: No results for input(s): "CHOL", "HDL", "LDLCALC", "TRIG", "CHOLHDL", "LDLDIRECT" in the last 72 hours. Thyroid function studies: No results for input(s): "TSH", "T4TOTAL", "T3FREE", "THYROIDAB" in the last 72 hours.  Invalid input(s): "FREET3" Anemia work up: No results for input(s): "VITAMINB12", "FOLATE", "FERRITIN", "TIBC", "IRON", "RETICCTPCT" in the last 72 hours. Sepsis Labs: Recent Labs  Lab 04/18/23 2046 04/18/23 2050 04/19/23 0620 04/20/23 0528 04/21/23 0612  WBC  --  27.5* 23.0* 19.2* 21.4*  LATICACIDVEN 1.5  --   --   --   --    Microbiology Recent Results (from the past 240 hours)  Blood Cultures x 2 sites     Status: None (Preliminary result)   Collection Time: 04/18/23  8:49 PM   Specimen: BLOOD LEFT FOREARM  Result Value Ref Range Status   Specimen Description BLOOD LEFT FOREARM  Final   Special Requests   Final    BOTTLES DRAWN  AEROBIC AND ANAEROBIC Blood Culture results may not be optimal due to an inadequate volume of blood received in culture bottles   Culture   Final    NO GROWTH 2 DAYS Performed at [facility], [address] [place], [place] [address]    Report Status PENDING  Incomplete  Blood Cultures x 2 sites     Status: None (Preliminary result)   Collection Time: 04/18/23 10:00 PM   Specimen: BLOOD  Result Value Ref Range Status   Specimen Description BLOOD BLOOD LEFT HAND  Final   Special Requests   Final  BOTTLES DRAWN AEROBIC AND ANAEROBIC Blood Culture results may not be optimal due to an inadequate volume of blood received in culture bottles   Culture   Final    NO GROWTH 2 DAYS Performed at [facility], [address], [place], [place] [address]    Report Status PENDING  Incomplete  Surgical pcr screen     Status: None   Collection Time: 04/20/23 12:47 AM   Specimen: Nasal Mucosa; Nasal Swab  Result Value Ref Range Status   MRSA, PCR NEGATIVE NEGATIVE Final   Staphylococcus aureus NEGATIVE NEGATIVE Final    Comment: (NOTE) The Xpert SA Assay (FDA approved for NASAL specimens in patients 54 years of age and older), is one component of a comprehensive surveillance program. It is not intended to diagnose infection nor to guide or monitor treatment. Performed at [facility], [address], [place], [place] [address]   Aerobic/Anaerobic Culture w Gram Stain (surgical/deep wound)     Status: None (Preliminary result)   Collection Time: 04/20/23  8:03 AM   Specimen: PATH Soft tissue  Result Value Ref Range Status   Specimen Description ABSCESS  Final   Special Requests left foot  Final   Gram Stain RARE WBC SEEN FEW GRAM POSITIVE COCCI   Final   Culture   Final    NO GROWTH < 24 HOURS Performed at [facility], [address], [place], [place] [address]    Report Status PENDING  Incomplete     Medications:    vitamin C  1,000 mg Oral Daily    enoxaparin (LOVENOX) injection  40 mg Subcutaneous Q24H   insulin aspart  0-15 Units Subcutaneous TID WC   insulin aspart  0-5 Units Subcutaneous QHS   insulin aspart  4 Units Subcutaneous TID WC   insulin glargine-yfgn  10 Units Subcutaneous BID   lisinopril  5 mg Oral Daily   metroNIDAZOLE  500 mg Oral Q12H   nutrition supplement (JUVEN)  1 packet Oral BID BM   polyethylene glycol  17 g Oral BID   pravastatin  20 mg Oral q1800   zinc sulfate (50mg  elemental zinc)  220 mg Oral Daily   Continuous Infusions:  sodium chloride 10 mL/hr at 04/20/23 2222   ceFEPime (MAXIPIME) IV 2 g (04/21/23 0200)   vancomycin 750 mg (04/20/23 2227)      LOS: 3 days   Marinda Elk  [facility]  04/21/2023, 8:03 AM

## 2023-04-21 NOTE — Progress Notes (Signed)
Ankle-brachial index completed. Please see CV Procedures for preliminary results.  Shona Simpson, RVT 04/21/23 11:10 AM

## 2023-04-21 NOTE — Progress Notes (Signed)
Patient ID: Danny Vargas, male   DOB: October 23, 1970, 52 y.o.   MRN: [id number] Patient is postoperative day 1 left foot second ray amputation.  The dressing is dry.  Cultures are showing gram-positive cocci.  I feel it would be okay for patient to discharge today I will place an order for doxycycline and a postoperative shoe and I will follow-up in 2 weeks.

## 2023-04-21 NOTE — Progress Notes (Signed)
Orthopedic Tech Progress Note Patient Details:  Danny Vargas [birth year] [id number]  Ortho Devices Type of Ortho Device: Postop shoe/boot Ortho Device/Splint Location: LLE Ortho Device/Splint Interventions: Ordered, Adjustment, Application   Post Interventions Patient Tolerated: Well Instructions Provided: Adjustment of device, Care of device, Poper ambulation with device  Jacalyn Biggs 04/21/2023, 11:28 AM

## 2023-04-21 NOTE — Plan of Care (Signed)
  Problem: Education: Goal: Knowledge of General Education information will improve Description: Including pain rating scale, medication(s)/side effects and non-pharmacologic comfort measures Outcome: Progressing   Problem: Health Behavior/Discharge Planning: Goal: Ability to manage health-related needs will improve Outcome: Progressing   Problem: Clinical Measurements: Goal: Ability to maintain clinical measurements within normal limits will improve Outcome: Progressing Goal: Will remain free from infection Outcome: Progressing Goal: Diagnostic test results will improve Outcome: Progressing Goal: Respiratory complications will improve Outcome: Progressing Goal: Cardiovascular complication will be avoided Outcome: Progressing   Problem: Activity: Goal: Risk for activity intolerance will decrease Outcome: Progressing   Problem: Nutrition: Goal: Adequate nutrition will be maintained Outcome: Progressing   Problem: Coping: Goal: Level of anxiety will decrease Outcome: Progressing   Problem: Elimination: Goal: Will not experience complications related to bowel motility Outcome: Progressing Goal: Will not experience complications related to urinary retention Outcome: Progressing   Problem: Pain Management: Goal: General experience of comfort will improve Outcome: Progressing   Problem: Safety: Goal: Ability to remain free from injury will improve Outcome: Progressing   Problem: Skin Integrity: Goal: Risk for impaired skin integrity will decrease Outcome: Progressing   Problem: Education: Goal: Ability to describe self-care measures that may prevent or decrease complications (Diabetes Survival Skills Education) will improve Outcome: Progressing Goal: Individualized Educational Video(s) Outcome: Progressing   Problem: Coping: Goal: Ability to adjust to condition or change in health will improve Outcome: Progressing   Problem: Fluid Volume: Goal: Ability to  maintain a balanced intake and output will improve Outcome: Progressing   Problem: Health Behavior/Discharge Planning: Goal: Ability to identify and utilize available resources and services will improve Outcome: Progressing Goal: Ability to manage health-related needs will improve Outcome: Progressing   Problem: Metabolic: Goal: Ability to maintain appropriate glucose levels will improve Outcome: Progressing   Problem: Nutritional: Goal: Maintenance of adequate nutrition will improve Outcome: Progressing Goal: Progress toward achieving an optimal weight will improve Outcome: Progressing   Problem: Skin Integrity: Goal: Risk for impaired skin integrity will decrease Outcome: Progressing   Problem: Tissue Perfusion: Goal: Adequacy of tissue perfusion will improve Outcome: Progressing   Problem: Education: Goal: Knowledge of the prescribed therapeutic regimen will improve Outcome: Progressing Goal: Ability to verbalize activity precautions or restrictions will improve Outcome: Progressing Goal: Understanding of discharge needs will improve Outcome: Progressing   Problem: Activity: Goal: Ability to perform//tolerate increased activity and mobilize with assistive devices will improve Outcome: Progressing   Problem: Clinical Measurements: Goal: Postoperative complications will be avoided or minimized Outcome: Progressing   Problem: Self-Care: Goal: Ability to meet self-care needs will improve Outcome: Progressing   Problem: Self-Concept: Goal: Ability to maintain and perform role responsibilities to the fullest extent possible will improve Outcome: Progressing   Problem: Pain Management: Goal: Pain level will decrease with appropriate interventions Outcome: Progressing   Problem: Skin Integrity: Goal: Demonstration of wound healing without infection will improve Outcome: Progressing

## 2023-04-21 NOTE — Progress Notes (Signed)
Pt states felling like blood sugar is low. CBG reading of 60. Pt given grape juice and graham crackers. CBG rechecked 15 minutes later with same reading. Given additional grape just. Pt state he feels that blood sugar is returning to normal. Advised would retake again in 15 minutes. MD notified.

## 2023-04-21 NOTE — Evaluation (Signed)
Physical Therapy Evaluation Patient Details Name: KENTREZ CIARAMELLA MRN: [id number] DOB: [birth year] Today's Date: 04/21/2023  History of Present Illness  Cordae HIPOLITO ALLTOP is a 52 y.o. male who presents for evaluation of left second toe infection. 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 Past medical history significant for hypertension, hyperlipidemia, GERD, and insulin-dependent diabetes mellitus.  Clinical Impression  Pt presents with admitting diagnosis above. Pt today was able to ambulate in hallway with RW supervision level. Pt extensively educated on WB precautions which pt states he was doing however appeared to be full WB at times with post op shoe. Despite this, pt appears to be fairly close to baseline and anticipate that will not need any follow up PT upon DC. Pt would benefit from RW upon DC and needs to practice stairs next session. PT will continue to follow.          If plan is discharge home, recommend the following: A little help with walking and/or transfers;Assist for transportation;Help with stairs or ramp for entrance   Can travel by private vehicle        Equipment Recommendations Rolling walker (2 wheels)  Recommendations for Other Services       Functional Status Assessment Patient has had a recent decline in their functional status and demonstrates the ability to make significant improvements in function in a reasonable and predictable amount of time.     Precautions / Restrictions Precautions Precautions: Fall Required Braces or Orthoses: Other Brace Other Brace: post op shoe Restrictions Weight Bearing Restrictions Per Provider Order: Yes LLE Weight Bearing Per Provider Order: Touchdown weight bearing      Mobility  Bed Mobility Overal bed mobility: Modified Independent                  Transfers Overall transfer level: Needs assistance Equipment used: Rolling walker (2 wheels) Transfers: Sit to/from Stand Sit to Stand: Supervision            General transfer comment: cues for hand placement    Ambulation/Gait Ambulation/Gait assistance: Supervision Gait Distance (Feet): 150 Feet Assistive device: Rolling walker (2 wheels) Gait Pattern/deviations: Antalgic, Decreased stride length, Step-through pattern Gait velocity: decreased     General Gait Details: Pt educated on proper WB technique with postop shoe. Pt states he was heel WB however appeared to be full WB at times. no LOB noted.  Stairs            Wheelchair Mobility     Tilt Bed    Modified Rankin (Stroke Patients Only)       Balance Overall balance assessment: Mild deficits observed, not formally tested                                           Pertinent Vitals/Pain Pain Assessment Pain Assessment: 0-10 Pain Score: 7  Pain Location: L foot Pain Descriptors / Indicators: Discomfort, Grimacing, Sore Pain Intervention(s): Monitored during session, Premedicated before session    Home Living Family/patient expects to be discharged to:: Private residence Living Arrangements: Spouse/significant other Available Help at Discharge: Family;Available PRN/intermittently Type of Home: House Home Access: Level entry     Alternate Level Stairs-Number of Steps: 7 Home Layout: Multi-level;Bed/bath upstairs Home Equipment: None      Prior Function Prior Level of Function : Independent/Modified Independent;Working/employed;Driving  Mobility Comments: Ind no AD ADLs Comments: Ind     Extremity/Trunk Assessment   Upper Extremity Assessment Upper Extremity Assessment: Overall WFL for tasks assessed    Lower Extremity Assessment Lower Extremity Assessment: LLE deficits/detail LLE Deficits / Details: L 2nd ray amputation    Cervical / Trunk Assessment Cervical / Trunk Assessment: Normal  Communication   Communication Communication: No apparent difficulties  Cognition Arousal: Alert Behavior During Therapy: WFL  for tasks assessed/performed Overall Cognitive Status: Within Functional Limits for tasks assessed                                          General Comments General comments (skin integrity, edema, etc.): VSS on RA    Exercises     Assessment/Plan    PT Assessment Patient needs continued PT services  PT Problem List Decreased strength;Decreased range of motion;Decreased activity tolerance;Decreased balance;Decreased mobility;Decreased coordination;Decreased knowledge of use of DME;Decreased safety awareness;Decreased knowledge of precautions;Cardiopulmonary status limiting activity       PT Treatment Interventions DME instruction;Gait training;Stair training;Functional mobility training;Therapeutic activities;Therapeutic exercise;Balance training;Neuromuscular re-education;Patient/family education    PT Goals (Current goals can be found in the Care Plan section)  Acute Rehab PT Goals Patient Stated Goal: to go home    Frequency Min 1X/week     Co-evaluation               AM-PAC PT "6 Clicks" Mobility  Outcome Measure Help needed turning from your back to your side while in a flat bed without using bedrails?: None Help needed moving from lying on your back to sitting on the side of a flat bed without using bedrails?: None Help needed moving to and from a bed to a chair (including a wheelchair)?: None Help needed standing up from a chair using your arms (e.g., wheelchair or bedside chair)?: None Help needed to walk in hospital room?: A Little Help needed climbing 3-5 steps with a railing? : A Little 6 Click Score: 22    End of Session Equipment Utilized During Treatment: Gait belt Activity Tolerance: Patient tolerated treatment well Patient left: in bed;with call bell/phone within reach Nurse Communication: Mobility status PT Visit Diagnosis: Other abnormalities of gait and mobility (R26.89)    Time: 1610-9604 PT Time Calculation (min) (ACUTE  ONLY): 17 min   Charges:   PT Evaluation $PT Eval Low Complexity: 1 Low   PT General Charges $$ ACUTE PT VISIT: 1 Visit         Shela Nevin, PT, DPT Acute Rehab Services [phone]   Gladys Damme 04/21/2023, 2:33 PM

## 2023-04-22 ENCOUNTER — Encounter (HOSPITAL_COMMUNITY): Payer: Self-pay | Admitting: Orthopedic Surgery

## 2023-04-22 DIAGNOSIS — I96 Gangrene, not elsewhere classified: Secondary | ICD-10-CM | POA: Diagnosis not present

## 2023-04-22 LAB — GLUCOSE, CAPILLARY
Glucose-Capillary: 63 mg/dL — ABNORMAL LOW (ref 70–99)
Glucose-Capillary: 223 mg/dL — ABNORMAL HIGH (ref 70–99)
Glucose-Capillary: 178 mg/dL — ABNORMAL HIGH (ref 70–99)
Glucose-Capillary: 112 mg/dL — ABNORMAL HIGH (ref 70–99)
Glucose-Capillary: 117 mg/dL — ABNORMAL HIGH (ref 70–99)
Glucose-Capillary: 217 mg/dL — ABNORMAL HIGH (ref 70–99)

## 2023-04-22 MED ORDER — SODIUM CHLORIDE 0.9 % IV SOLN
12.5000 mg | Freq: Four times a day (QID) | INTRAVENOUS | Status: DC | PRN
  Administered 2023-04-22 – 2023-04-23 (×2): 12.5 mg via INTRAVENOUS
  Filled 2023-04-22: qty 12.5
  Filled 2023-04-22: qty 0.5

## 2023-04-22 MED ORDER — DOXYCYCLINE HYCLATE 100 MG PO TABS
100.0000 mg | ORAL_TABLET | Freq: Two times a day (BID) | ORAL | Status: DC
  Administered 2023-04-22 – 2023-04-23 (×3): 100 mg via ORAL
  Filled 2023-04-22 (×3): qty 1

## 2023-04-22 MED ORDER — INSULIN ASPART 100 UNIT/ML IJ SOLN
0.0000 [IU] | Freq: Three times a day (TID) | INTRAMUSCULAR | Status: DC
Start: 2023-04-22 — End: 2023-04-23
  Administered 2023-04-23: 1 [IU] via SUBCUTANEOUS
  Administered 2023-04-23: 5 [IU] via SUBCUTANEOUS

## 2023-04-22 MED ORDER — INSULIN ASPART 100 UNIT/ML IJ SOLN
0.0000 [IU] | Freq: Every day | INTRAMUSCULAR | Status: DC
  Administered 2023-04-22: 2 [IU] via SUBCUTANEOUS

## 2023-04-22 NOTE — Inpatient Diabetes Management (Addendum)
Inpatient Diabetes Program Recommendations  AACE/ADA: New Consensus Statement on Inpatient Glycemic Control  Target Ranges:  Prepandial:   less than 140 mg/dL      Peak postprandial:   less than 180 mg/dL (1-2 hours)      Critically ill patients:  140 - 180 mg/dL    Latest Reference Range & Units 04/22/23 06:36 04/22/23 07:53  Glucose-Capillary 70 - 99 mg/dL 63 (L) 629 (H)  Novolog 4 units  Semglee 5 units    Latest Reference Range & Units 04/21/23 08:11 04/21/23 13:20 04/21/23 16:07 04/21/23 16:09 04/21/23 16:11 04/21/23 21:37  Glucose-Capillary 70 - 99 mg/dL 528 (H)  Novolog 7 units  Semglee 10 units 209 (H)  Novolog 9 units 34 (LL) 34 (LL) 60 (L) 226 (H)  Novolog 2 units  Semglee 5 units   Review of Glycemic Control  Diabetes history: DM1 Outpatient Diabetes medications: Medtronic 770G  insulin pump with Novolog Current orders for Inpatient glycemic control: Semglee 5 units BID, Novolog 0-15 units TID with meals, Novolog 0-5 units at bedtime, Novolog 4 units TID with meals   Inpatient Diabetes Program Recommendations:    Insulin: Noted Semglee decreased from 10 units BID to 5 units BID on 04/21/23. Please consider decreasing Novolog correction to 0-9 units TID with meals.   Outpatient DM: At time of discharge, please provide Rx for Guardian transmitter [phone]) so patient can resume using Guardian CGM.  Addendum 04/22/23@11 :15-Received chat message from RN and Dr. Pola Corn regarding patient resuming insulin pump. Dr. Pola Corn notes patient has insulin pump at bedside and he plans to allow patient to resume his insulin pump. Recommended that all SQ insulin orders be discontinued and Insulin Pump order set be used to order CBGs AC&HS and 2am and Insulin Pump AC&HS and 2am.   Addendum 04/22/23@13 :25-Per chat message with Dr. Pola Corn, since patient does not know his insulin pump settings, will continue to use SQ insulin regimen while inpatient and can resume pump after  discharge.  Thanks, Orlando Penner, RN, MSN, CDCES Diabetes Coordinator Inpatient Diabetes Program [phone] (Team Pager from 8am to 5pm)

## 2023-04-22 NOTE — Progress Notes (Signed)
PROGRESS NOTE  Danny Vargas Dec  DOB: 06/22/1970  PCP: Teodoro Spray, MD ZOX:[id number]  DOA: 04/18/2023  LOS: 4 days  Hospital Day: 5  Brief narrative: Danny Vargas is a 52 y.o. male with PMH significant for DM2, HTN, HLD, GERD, marijuana use 12/19, patient presented to the ED with complaint of nonhealing wound of left second toe Admitted to [facility] Orthopedics consulted 12/20, MRI left foot showed second toe osteomyelitis, cellulitis without abscess 12/21, underwent left second ray amputation 12/22, ABI normal See below for details  Subjective: Patient was seen and examined this morning.  Middle-aged African-American male.  Lying on bed.  He had vomited just prior to my evaluation.  He states it is probably related to gastroparesis.  He has chronic intermittent vomiting and takes Zofran at home. Chart reviewed In the last 24 hours, hemodynamically stable Blood sugar level was severely low in 30s yesterday afternoon.  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 Lajoyce Corners Blood culture negative till date  Wound culture grew few gram-positive cocci. Initially treated with broad-spectrum antibiotics. Currently on oral doxycycline.  Per orthopedics 12/22, okay to discharge on oral doxycycline and a postop shoe. For pain control, currently on PRN Dilaudid, oxycodone, Tylenol Blood work yesterday had WBC count elevated to 21,000.  No fever.  Repeat labs tomorrow. Recent Labs  Lab 04/18/23 2046 04/18/23 2050 04/19/23 0620 04/20/23 0528 04/21/23 0612  WBC  --  27.5* 23.0* 19.2* 21.4*  LATICACIDVEN 1.5  --   --   --   --    Type 2 diabetes mellitus Hypoglycemia/hyperglycemia A1c 9.6 on 04/19/2023 PTA meds-on insulin pump Currently on SSI/Accu-Cheks Blood sugar trend as below shows significant fluctuation in blood glucose  level and 24-hour duration. Diabetes care coordinator consult appreciated. Reduce Semglee to sensitive scale. Recent Labs  Lab 04/21/23 2137 04/22/23 0636 04/22/23 0753 04/22/23 0949 04/22/23 1118  GLUCAP 226* 63* 223* 178* 112*   Intractable nausea, vomiting Likely secondary to gastroparesis. As needed IV Zofran (ordered.  HTN Continue lisinopril    Mobility: Encourage ambulation  Goals of care   Code Status: Full Code     DVT prophylaxis:  SCD's Start: 04/20/23 2103 enoxaparin (LOVENOX) injection 40 mg Start: 04/19/23 1000   Antimicrobials: Oral doxycycline Fluid: None Consultants: Orthopedics Family Communication: None at bedside  Status: Inpatient Level of care:  Med-Surg   Patient is from: Home Needs to continue in-hospital care: Has instructed nausea and vomiting today.  If he stops, plan to discharge home tomorrow.    Diet:  Diet Order             Diet Carb Modified Fluid consistency: Thin; Room service appropriate? Yes  Diet effective now                   Scheduled Meds:  vitamin C  1,000 mg Oral Daily   doxycycline  100 mg Oral Q12H   enoxaparin (LOVENOX) injection  40 mg Subcutaneous Q24H   insulin aspart  0-5 Units Subcutaneous QHS   insulin aspart  0-9 Units Subcutaneous TID WC   insulin glargine-yfgn  5 Units Subcutaneous BID   lisinopril  5 mg Oral Daily   nutrition supplement (JUVEN)  1 packet Oral BID BM   pravastatin  20 mg Oral q1800   zinc sulfate (50mg  elemental zinc)  220 mg Oral Daily    PRN meds: acetaminophen, HYDROmorphone (DILAUDID) injection, ondansetron **OR** ondansetron (ZOFRAN) IV, oxyCODONE, polyethylene glycol, promethazine (PHENERGAN) injection (IM or IVPB)   Infusions:   sodium chloride 10 mL/hr at 04/20/23 2222   promethazine (PHENERGAN) injection (IM or IVPB)      Antimicrobials: Anti-infectives (From admission, onward)    Start     Dose/Rate Route Frequency Ordered Stop   04/22/23 1315   doxycycline (VIBRA-TABS) tablet 100 mg        100 mg Oral Every 12 hours 04/22/23 1224     04/21/23 0000  doxycycline (VIBRA-TABS) 100 MG tablet        100 mg Oral 2 times daily 04/21/23 0953     04/20/23 0714  ceFAZolin (ANCEF) 2-4 GM/100ML-% IVPB       Note to Pharmacy: Samuella Cota O: cabinet override      04/20/23 0714 04/20/23 0803   04/20/23 0600  ceFAZolin (ANCEF) IVPB 2g/100 mL premix        2 g 200 mL/hr over 30 Minutes Intravenous On call to O.R. 04/19/23 1935 04/20/23 0756   04/19/23 1100  vancomycin (VANCOREADY) IVPB 750 mg/150 mL  Status:  Discontinued        750 mg 150 mL/hr over 60 Minutes Intravenous Every 12 hours 04/18/23 2250 04/21/23 1008   04/19/23 0000  ceFEPIme (MAXIPIME) 2 g in sodium chloride 0.9 % 100 mL IVPB  Status:  Discontinued        2 g 200 mL/hr over 30 Minutes Intravenous Every 8 hours 04/18/23 2250 04/21/23 1008   04/18/23 2315  metroNIDAZOLE (FLAGYL) tablet 500 mg  Status:  Discontinued        500 mg Oral Every 12 hours 04/18/23 2239 04/21/23 1008   04/18/23 2215  Vancomycin (VANCOCIN) 1,500 mg in sodium chloride 0.9 % 500 mL IVPB        1,500 mg 250 mL/hr over 120 Minutes Intravenous  Once 04/18/23 2213 04/19/23 0351       Objective: Vitals:   04/22/23 0534 04/22/23 0756  BP: 119/79 118/66  Pulse: 88 99  Resp: 18   Temp: 98.7 F (37.1 C) 98.4 F (36.9 C)  SpO2: 99% 100%    Intake/Output Summary (Last 24 hours) at 04/22/2023 1327 Last data filed at 04/22/2023 0826 Gross per 24 hour  Intake 120 ml  Output --  Net 120 ml   Filed Weights   04/18/23 2200 04/20/23 0657  Weight: 65.8 kg 65.8 kg   Weight change:  Body mass index is 18.61 kg/m.   Physical Exam: General exam: Pleasant, middle-aged African-American male. Skin: No rashes, lesions or ulcers. HEENT: Atraumatic, normocephalic, no obvious bleeding Lungs: Clear to auscultation bilaterally,  CVS: S1, S2, no murmur,   GI/Abd: Soft, nontender, nondistended, bowel sound  present,   CNS: Alert, awake, alert x 3 Psychiatry: Mood appropriate,  Extremities: No pedal edema, no calf tenderness, left foot has bandage on  Data Review: I have personally reviewed the laboratory data and studies available.  F/u labs  Unresulted Labs (From admission, onward)     Start     Ordered   04/25/23 0500  Creatinine, serum  (enoxaparin (LOVENOX)    CrCl >/= 30 ml/min)  Weekly,   R     Comments: while on enoxaparin therapy    04/18/23 2239   04/23/23 0500  Basic metabolic panel  Tomorrow morning,   R        04/22/23 1327  04/23/23 0500  CBC with Differential/Platelet  Tomorrow morning,   R        04/22/23 1327            Admission date and time: 04/18/2023  6:38 PM   Total time spent in review of labs and imaging, patient evaluation, formulation of plan, documentation and communication with family: 45 minutes  Signed, Lorin Glass, MD [facility] 04/22/2023

## 2023-04-22 NOTE — TOC Initial Note (Signed)
Transition of Care [facility]) - Initial/Assessment Note    Patient Details  Name: Danny Vargas MRN: [id number] Date of Birth: January 31, 1971  [facility]) CM/SW Contact:    Epifanio Lesches, RN Phone Number: 04/22/2023, 1:58 PM  Clinical Narrative:                 Presents with  left second toe infection.      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 Supportive mother.PTA independent with ADL's , no DME usage. Per PT eval, pt without PT f/u needs. Dme need noted for RW. Order placed and referral made with [facility]. Equipment will be delivered to bedside prior to discharge. Pt without transportation or RX MED concerns.  TOC team following and will assist with needs...  Expected Discharge Plan: Home/Self Care Barriers to Discharge: Continued Medical Work up   Patient Goals and CMS Choice     Choice offered to / list presented to : Patient      Expected Discharge Plan and Services   Discharge Planning Services: CM Consult                     DME Arranged: Dan Humphreys rolling DME Agency: [facility] Date DME Agency Contacted: 04/22/23 Time DME Agency Contacted: [phone] Representative spoke with at DME Agency: Ian Malkin            Prior Living Arrangements/Services                       Activities of Daily Living   ADL Screening (condition at time of admission) Independently performs ADLs?: Yes (appropriate for developmental age) Is the patient deaf or have difficulty hearing?: No Does the patient have difficulty seeing, even when wearing glasses/contacts?: No Does the patient have difficulty concentrating, remembering, or making decisions?: No  Permission Sought/Granted                  Emotional Assessment              Admission diagnosis:  Cellulitis of left lower extremity [L03.116] Necrosis of toe (HCC) [I96] Gangrene of toe of left foot (HCC) [I96] Patient Active Problem List   Diagnosis Date Noted   Acute osteomyelitis  of left foot (HCC) 04/20/2023   Protein-calorie malnutrition, severe 04/19/2023   Cellulitis of left lower extremity 04/19/2023   Gangrene of toe of left foot (HCC) 04/18/2023   Cannabis hyperemesis syndrome concurrent with and due to cannabis abuse (HCC) 08/19/2020   Uncontrolled type 2 diabetes mellitus with hyperglycemia, with long-term current use of insulin (HCC) 08/19/2020   Acute kidney injury (nontraumatic) (HCC)    Intractable vomiting 08/18/2020   Esophagitis 12/16/2017   Hematemesis 12/14/2017   HTN (hypertension) 12/14/2017   HLD (hyperlipidemia) 12/14/2017   Marijuana abuse 12/14/2017   Upper GI bleed 12/01/2017   Gastroparesis due to DM (HCC) 12/01/2017   Leukocytosis 12/01/2017   Diabetes (HCC) 10/28/2013   GERD (gastroesophageal reflux disease) 10/28/2013   Nausea & vomiting 10/28/2013   DKA (diabetic ketoacidosis) (HCC) 10/28/2013   PCP:  Teodoro Spray, MD Pharmacy:   [phone] [place] [place] - [address] [place] [address] Phone: [phone] Fax: [phone]  [facility] - [place], [place] - [facility] AT Portal to Registered [facility] [address] [place] [address] Phone: [phone] Fax: [phone]     Social Drivers of  Health (SDOH) Social History: SDOH Screenings   Food Insecurity: No Food Insecurity (04/18/2023)  Housing: Low Risk  (04/18/2023)  Transportation Needs: No Transportation Needs (04/18/2023)  Utilities: Not At Risk (04/18/2023)  Social Connections: Unknown (09/04/2021)   Received from [facility]  Tobacco Use: High Risk (04/20/2023)  Health Literacy: Low Risk  (05/20/2020)   Received from [facility]   SDOH Interventions:     Readmission Risk Interventions     No data to display

## 2023-04-22 NOTE — Progress Notes (Signed)
Physical Therapy Treatment Patient Details Name: Danny Vargas MRN: [id number] DOB: Nov 29, 1970 Today's Date: 04/22/2023   History of Present Illness Danny Vargas is a 52 y.o. male who presents for evaluation of left second toe infection. 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 Past medical history significant for hypertension, hyperlipidemia, GERD, and insulin-dependent diabetes mellitus.    PT Comments  Pt supine in bed on arrival.  Re-educated on weight bearing restrictions and emphasized use of step to pattern and tricep recruitment to improve weight bearing.  Pt progressing well.     If plan is discharge home, recommend the following: A little help with walking and/or transfers;Assist for transportation;Help with stairs or ramp for entrance   Can travel by private vehicle        Equipment Recommendations  Rolling walker (2 wheels)    Recommendations for Other Services       Precautions / Restrictions Precautions Precautions: Fall Required Braces or Orthoses: Other Brace Other Brace: post op shoe Restrictions Weight Bearing Restrictions Per Provider Order: Yes LLE Weight Bearing Per Provider Order: Touchdown weight bearing     Mobility  Bed Mobility Overal bed mobility: Modified Independent                  Transfers Overall transfer level: Needs assistance Equipment used: Rolling walker (2 wheels) Transfers: Sit to/from Stand Sit to Stand: Supervision           General transfer comment: cues for hand placement    Ambulation/Gait Ambulation/Gait assistance: Supervision Gait Distance (Feet): 200 Feet Assistive device: Rolling walker (2 wheels) Gait Pattern/deviations: Antalgic, Decreased stride length, Step-through pattern Gait velocity: decreased     General Gait Details: Pt educated on proper WB technique with postop shoe. Pt states he was heel WB however appeared to be full WB at times. no LOB noted.  Focused on step to pattern and use of triceps  to unload weight on surgical foot, he appeared to follow weight bearing restriction better with this technique.   Stairs             Wheelchair Mobility     Tilt Bed    Modified Rankin (Stroke Patients Only)       Balance Overall balance assessment: Mild deficits observed, not formally tested                                          Cognition Arousal: Alert Behavior During Therapy: WFL for tasks assessed/performed Overall Cognitive Status: Within Functional Limits for tasks assessed                                          Exercises      General Comments        Pertinent Vitals/Pain Pain Assessment Pain Assessment: 0-10 Pain Score: 2  Pain Location: L foot Pain Descriptors / Indicators: Discomfort, Grimacing, Sore Pain Intervention(s): Monitored during session, Repositioned    Home Living                          Prior Function            PT Goals (current goals can now be found in the care plan section) Acute Rehab PT Goals Patient Stated  Goal: to go home Progress towards PT goals: Progressing toward goals    Frequency    Min 1X/week      PT Plan      Co-evaluation              AM-PAC PT "6 Clicks" Mobility   Outcome Measure  Help needed turning from your back to your side while in a flat bed without using bedrails?: None Help needed moving from lying on your back to sitting on the side of a flat bed without using bedrails?: None Help needed moving to and from a bed to a chair (including a wheelchair)?: None Help needed standing up from a chair using your arms (e.g., wheelchair or bedside chair)?: None Help needed to walk in hospital room?: A Little Help needed climbing 3-5 steps with a railing? : A Little 6 Click Score: 22    End of Session Equipment Utilized During Treatment: Gait belt Activity Tolerance: Patient tolerated treatment well Patient left: in bed;with call bell/phone  within reach Nurse Communication: Mobility status PT Visit Diagnosis: Other abnormalities of gait and mobility (R26.89)     Time: 1300-1315 PT Time Calculation (min) (ACUTE ONLY): 15 min  Charges:    $Gait Training: 8-22 mins PT General Charges $$ ACUTE PT VISIT: 1 Visit                     Bonney Leitz , PTA Acute Rehabilitation Services Office [phone]    Florestine Avers 04/22/2023, 2:09 PM

## 2023-04-23 ENCOUNTER — Other Ambulatory Visit (HOSPITAL_COMMUNITY): Payer: Self-pay

## 2023-04-23 DIAGNOSIS — I96 Gangrene, not elsewhere classified: Secondary | ICD-10-CM | POA: Diagnosis not present

## 2023-04-23 LAB — CBC WITH DIFFERENTIAL/PLATELET
Abs Immature Granulocytes: 0.12 10*3/uL — ABNORMAL HIGH (ref 0.00–0.07)
Basophils Absolute: 0.1 10*3/uL (ref 0.0–0.1)
Basophils Relative: 1 %
Eosinophils Absolute: 0.5 10*3/uL (ref 0.0–0.5)
Eosinophils Relative: 3 %
HCT: 34.4 % — ABNORMAL LOW (ref 39.0–52.0)
Hemoglobin: 11.1 g/dL — ABNORMAL LOW (ref 13.0–17.0)
Immature Granulocytes: 1 %
Lymphocytes Relative: 15 %
Lymphs Abs: 2.2 10*3/uL (ref 0.7–4.0)
MCH: 26.8 pg (ref 26.0–34.0)
MCHC: 32.3 g/dL (ref 30.0–36.0)
MCV: 83.1 fL (ref 80.0–100.0)
Monocytes Absolute: 1.3 10*3/uL — ABNORMAL HIGH (ref 0.1–1.0)
Monocytes Relative: 9 %
Neutro Abs: 10.6 10*3/uL — ABNORMAL HIGH (ref 1.7–7.7)
Neutrophils Relative %: 71 %
Platelets: 528 10*3/uL — ABNORMAL HIGH (ref 150–400)
RBC: 4.14 MIL/uL — ABNORMAL LOW (ref 4.22–5.81)
RDW: 15.2 % (ref 11.5–15.5)
WBC: 14.7 10*3/uL — ABNORMAL HIGH (ref 4.0–10.5)
nRBC: 0 % (ref 0.0–0.2)

## 2023-04-23 LAB — BASIC METABOLIC PANEL
Anion gap: 7 (ref 5–15)
BUN: 5 mg/dL — ABNORMAL LOW (ref 6–20)
CO2: 26 mmol/L (ref 22–32)
Calcium: 8.7 mg/dL — ABNORMAL LOW (ref 8.9–10.3)
Chloride: 106 mmol/L (ref 98–111)
Creatinine, Ser: 0.81 mg/dL (ref 0.61–1.24)
GFR, Estimated: >60 mL/min (ref >60)
Glucose, Bld: 141 mg/dL — ABNORMAL HIGH (ref 70–99)
Potassium: 3.7 mmol/L (ref 3.5–5.1)
Sodium: 139 mmol/L (ref 135–145)

## 2023-04-23 LAB — CULTURE, BLOOD (ROUTINE X 2)
Culture: NO GROWTH
Culture: NO GROWTH

## 2023-04-23 LAB — AEROBIC/ANAEROBIC CULTURE W GRAM STAIN (SURGICAL/DEEP WOUND)

## 2023-04-23 LAB — GLUCOSE, CAPILLARY
Glucose-Capillary: 132 mg/dL — ABNORMAL HIGH (ref 70–99)
Glucose-Capillary: 274 mg/dL — ABNORMAL HIGH (ref 70–99)

## 2023-04-23 MED ORDER — OXYCODONE HCL 5 MG PO TABS
5.0000 mg | ORAL_TABLET | Freq: Four times a day (QID) | ORAL | 0 refills | Status: AC | PRN
  Filled 2023-04-23: qty 20, 5d supply, fill #0

## 2023-04-23 MED ORDER — ONDANSETRON HCL 4 MG PO TABS
4.0000 mg | ORAL_TABLET | Freq: Four times a day (QID) | ORAL | 0 refills | Status: AC | PRN
  Filled 2023-04-23: qty 20, 5d supply, fill #0

## 2023-04-23 MED ORDER — DOXYCYCLINE HYCLATE 100 MG PO TABS
100.0000 mg | ORAL_TABLET | Freq: Two times a day (BID) | ORAL | 0 refills | Status: DC
  Filled 2023-04-23 (×2): qty 60, 30d supply, fill #0

## 2023-04-23 MED ORDER — ACIDOPHILUS PO CAPS
ORAL_CAPSULE | Freq: Three times a day (TID) | ORAL | 0 refills | Status: AC
  Filled 2023-04-23: qty 90, 30d supply, fill #0

## 2023-04-23 MED ORDER — SACCHAROMYCES BOULARDII 250 MG PO CAPS
250.0000 mg | ORAL_CAPSULE | Freq: Two times a day (BID) | ORAL | 0 refills | Status: DC
  Filled 2023-04-23: qty 60, 30d supply, fill #0

## 2023-04-23 NOTE — Discharge Instructions (Addendum)
Recommendations at discharge:  Per orthopedic recommendation, continue oral doxycycline for 30 days with probiotics Use postop shoe Follow-up with orthopedics as an outpatient in 2 weeks Continue insulin pump as before As needed Zofran for nausea, vomiting General discharge instructions: Follow with Primary MD Danny Spray, MD in 7 days  Please request your PCP  to go over your hospital tests, procedures, radiology results at the follow up. Please get your medicines reviewed and adjusted.  Your PCP may decide to repeat certain labs or tests as needed. Do not drive, operate heavy machinery, perform activities at heights, swimming or participation in water activities or provide baby sitting services if your were admitted for syncope or siezures until you have seen by Primary MD or a Neurologist and advised to do so again. [place] Controlled Substance Reporting System database was reviewed. Do not drive, operate heavy machinery, perform activities at heights, swim, participate in water activities or provide baby-sitting services while on medications for pain, sleep and mood until your outpatient physician has reevaluated you and advised to do so again.  You are strongly recommended to comply with the dose, frequency and duration of prescribed medications. Activity: As tolerated with Full fall precautions use walker/cane & assistance as needed Avoid using any recreational substances like cigarette, tobacco, alcohol, or non-prescribed drug. If you experience worsening of your admission symptoms, develop shortness of breath, life threatening emergency, suicidal or homicidal thoughts you must seek medical attention immediately by calling 911 or calling your MD immediately  if symptoms less severe. You must read complete instructions/literature along with all the possible adverse reactions/side effects for all the medicines you take and that have been prescribed to you. Take any new medicine only  after you have completely understood and accepted all the possible adverse reactions/side effects.  Wear Seat belts while driving. You were cared for by a hospitalist during your hospital stay. If you have any questions about your discharge medications or the care you received while you were in the hospital after you are discharged, you can call the unit and ask to speak with the hospitalist or the covering physician. Once you are discharged, your primary care physician will handle any further medical issues. Please note that NO REFILLS for any discharge medications will be authorized once you are discharged, as it is imperative that you return to your primary care physician (or establish a relationship with a primary care physician if you do not have one).   Discharge instructions for diabetes mellitus: Check blood sugar 3 times a day and bedtime at home. If blood sugar running above 200 or less than 70 please call your MD to adjust insulin. If you notice signs and symptoms of hypoglycemia (low blood sugar) like jitteriness, confusion, thirst, tremor and sweating, please check blood sugar, drink sugary drink/biscuits/sweets to increase sugar level and call MD or return to ER.  PDMP reviewed this encounter.   Opioid taper instructions: It is important to wean off of your opioid medication as soon as possible. If you do not need pain medication after your surgery it is ok to stop day one. Opioids include: Codeine, Hydrocodone(Norco, Vicodin), Oxycodone(Percocet, oxycontin) and hydromorphone amongst others.  Long term and even short term use of opiods can cause: Increased pain response Dependence Constipation Depression Respiratory depression And more.  Withdrawal symptoms can include Flu like symptoms Nausea, vomiting And more Techniques to manage these symptoms Hydrate well Eat regular healthy meals Stay active Use relaxation techniques(deep breathing, meditating, yoga) Do Not  substitute Alcohol to help with tapering If you have been on opioids for less than two weeks and do not have pain than it is ok to stop all together.  Plan to wean off of opioids This plan should start within one week post op of your joint replacement. Maintain the same interval or time between taking each dose and first decrease the dose.  Cut the total daily intake of opioids by one tablet each day Next start to increase the time between doses. The last dose that should be eliminated is the evening dose.

## 2023-04-23 NOTE — Plan of Care (Signed)
  Problem: Education: Goal: Knowledge of General Education information will improve Description: Including pain rating scale, medication(s)/side effects and non-pharmacologic comfort measures Outcome: Progressing   Problem: Health Behavior/Discharge Planning: Goal: Ability to manage health-related needs will improve Outcome: Progressing   Problem: Clinical Measurements: Goal: Ability to maintain clinical measurements within normal limits will improve Outcome: Progressing Goal: Will remain free from infection Outcome: Progressing Goal: Diagnostic test results will improve Outcome: Progressing Goal: Respiratory complications will improve Outcome: Progressing Goal: Cardiovascular complication will be avoided Outcome: Progressing   Problem: Activity: Goal: Risk for activity intolerance will decrease Outcome: Progressing   Problem: Nutrition: Goal: Adequate nutrition will be maintained Outcome: Progressing   Problem: Coping: Goal: Level of anxiety will decrease Outcome: Progressing   Problem: Elimination: Goal: Will not experience complications related to bowel motility Outcome: Progressing Goal: Will not experience complications related to urinary retention Outcome: Progressing   Problem: Pain Management: Goal: General experience of comfort will improve Outcome: Progressing   Problem: Safety: Goal: Ability to remain free from injury will improve Outcome: Progressing   Problem: Skin Integrity: Goal: Risk for impaired skin integrity will decrease Outcome: Progressing   Problem: Education: Goal: Ability to describe self-care measures that may prevent or decrease complications (Diabetes Survival Skills Education) will improve Outcome: Progressing Goal: Individualized Educational Video(s) Outcome: Progressing   Problem: Coping: Goal: Ability to adjust to condition or change in health will improve Outcome: Progressing   Problem: Fluid Volume: Goal: Ability to  maintain a balanced intake and output will improve Outcome: Progressing   Problem: Health Behavior/Discharge Planning: Goal: Ability to identify and utilize available resources and services will improve Outcome: Progressing Goal: Ability to manage health-related needs will improve Outcome: Progressing   Problem: Metabolic: Goal: Ability to maintain appropriate glucose levels will improve Outcome: Progressing   Problem: Nutritional: Goal: Maintenance of adequate nutrition will improve Outcome: Progressing Goal: Progress toward achieving an optimal weight will improve Outcome: Progressing   Problem: Skin Integrity: Goal: Risk for impaired skin integrity will decrease Outcome: Progressing   Problem: Tissue Perfusion: Goal: Adequacy of tissue perfusion will improve Outcome: Progressing   Problem: Education: Goal: Knowledge of the prescribed therapeutic regimen will improve Outcome: Progressing Goal: Ability to verbalize activity precautions or restrictions will improve Outcome: Progressing Goal: Understanding of discharge needs will improve Outcome: Progressing   Problem: Activity: Goal: Ability to perform//tolerate increased activity and mobilize with assistive devices will improve Outcome: Progressing   Problem: Clinical Measurements: Goal: Postoperative complications will be avoided or minimized Outcome: Progressing   Problem: Self-Care: Goal: Ability to meet self-care needs will improve Outcome: Progressing   Problem: Self-Concept: Goal: Ability to maintain and perform role responsibilities to the fullest extent possible will improve Outcome: Progressing   Problem: Pain Management: Goal: Pain level will decrease with appropriate interventions Outcome: Progressing   Problem: Skin Integrity: Goal: Demonstration of wound healing without infection will improve Outcome: Progressing

## 2023-04-23 NOTE — Discharge Summary (Addendum)
Physician Discharge Summary  Danny Vargas AOZ:[id number] DOB: June 10, 1970 DOA: 04/18/2023  PCP: Teodoro Spray, MD  Admit date: 04/18/2023 Discharge date: 04/23/2023  Admitted From: Home Discharge disposition: Home  Recommendations at discharge:  Per orthopedic recommendation, continue oral doxycycline for 30 days with probiotics Use postop shoe Follow-up with orthopedics as an outpatient in 2 weeks Continue insulin pump as before As needed Zofran for nausea, vomiting   Brief narrative: Danny Vargas is a 52 y.o. male with PMH significant for DM2, HTN, HLD, GERD, marijuana use 12/19, patient presented to the ED with complaint of nonhealing wound of left second toe Admitted to [facility] Orthopedics consulted 12/20, MRI left foot showed second toe osteomyelitis, cellulitis without abscess 12/21, underwent left second ray amputation 12/22, ABI normal See below for details  Subjective: Patient was seen and examined this morning.  Lying down in bed not in distress.  Nausea vomiting improving.  Labs from this morning showed improvement in WBC to 14,000. Blood sugar level stable.  Assessment and plan: Left second toe osteomyelitis and gangrene S/p left second ray amputation -12/21 -Dr. Lajoyce Corners Blood culture negative till date  Wound culture grew few gram-positive cocci. Initially treated with broad-spectrum antibiotics. Currently on oral doxycycline.  To be scant improving. Per orthopedics 12/22, okay to discharge on oral doxycycline and a postop shoe.  It seems Dr. Lajoyce Corners sent a prescription for 30 days of doxycycline twice daily to a pharmacy.  Patient wants prescription filled through [facility] pharmacy.  I resent the prescription. For pain control, patient will be on as needed Percocet. Recent Labs  Lab 04/18/23 2046 04/18/23 2050 04/19/23 0620 04/20/23 0528 04/21/23 0612 04/23/23 0536  WBC  --  27.5* 23.0* 19.2* 21.4* 14.7*  LATICACIDVEN 1.5  --   --   --   --   --     Type 2 diabetes mellitus Hypoglycemia/hyperglycemia A1c 9.6 on 04/19/2023 PTA meds-on insulin pump Currently blood sugars controlled with SSI/Accu-Cheks Post discharge, he will resume insulin pump Recent Labs  Lab 04/22/23 0949 04/22/23 1118 04/22/23 1617 04/22/23 2217 04/23/23 0548  GLUCAP 178* 112* 117* 217* 132*   Intractable nausea, vomiting Likely secondary to gastroparesis. As needed Zofran to continue at home.  HTN Continue lisinopril    Goals of care   Code Status: Full Code   Diet:  Diet Order             Diet Carb Modified           Diet Carb Modified Fluid consistency: Thin; Room service appropriate? Yes  Diet effective now                   Nutritional status:  Body mass index is 18.61 kg/m.  Nutrition Problem: Severe Malnutrition Etiology: chronic illness Signs/Symptoms: severe fat depletion, severe muscle depletion, percent weight loss Percent weight loss: 7.6 %  Wounds:  - Incision (Closed) 04/20/23 Foot Left (Active)  Date First Assessed/Time First Assessed: 04/20/23 0813   Location: Foot  Location Orientation: Left    Assessments 04/20/2023  8:23 AM 04/23/2023  8:09 AM  Dressing Type Abdominal pads;Compression wrap Compression wrap  Dressing Clean, Dry, Intact Clean, Dry, Intact  Site / Wound Assessment Dressing in place / Unable to assess --  Drainage Amount None --     No associated orders.    Discharge Exam:   Vitals:   04/22/23 2047 04/22/23 2048 04/23/23 0536 04/23/23 0846  BP: 108/64 108/64 135/85 124/74  Pulse: 96 95  99 97  Resp: 15 15  18   Temp: 99 F (37.2 C) 99 F (37.2 C) 99.2 F (37.3 C) 99.2 F (37.3 C)  TempSrc: Oral Oral Oral Oral  SpO2: 99% 99% 100% 100%  Weight:      Height:        Body mass index is 18.61 kg/m.   General exam: Pleasant, middle-aged African-American male. Skin: No rashes, lesions or ulcers. HEENT: Atraumatic, normocephalic, no obvious bleeding Lungs: Clear to auscultation  bilaterally,  CVS: S1, S2, no murmur,   GI/Abd: Soft, nontender, nondistended, bowel sound present,   CNS: Alert, awake, alert x 3 Psychiatry: Mood appropriate,  Extremities: No pedal edema, no calf tenderness, left foot has bandage on  Follow ups:    Follow-up Information     Nadara Mustard, MD Follow up in 2 week(s).   Specialty: Orthopedic Surgery Contact information: [address] [place] [address] [phone]         Teodoro Spray, MD Follow up.   Specialty: Internal Medicine Contact information: [address] [place] [address] [phone]                 Discharge Instructions:   Discharge Instructions     Call MD for:  difficulty breathing, headache or visual disturbances   Complete by: As directed    Call MD for:  extreme fatigue   Complete by: As directed    Call MD for:  hives   Complete by: As directed    Call MD for:  persistant dizziness or light-headedness   Complete by: As directed    Call MD for:  persistant nausea and vomiting   Complete by: As directed    Call MD for:  severe uncontrolled pain   Complete by: As directed    Call MD for:  temperature >100.4   Complete by: As directed    Diet Carb Modified   Complete by: As directed    Increase activity slowly   Complete by: As directed    Touch down weight bearing   Complete by: As directed    Laterality: left   Extremity: Lower       Discharge Medications:   Allergies as of 04/23/2023       Reactions   Peanut-containing Drug Products Itching, Rash   Not all peanuts trigger the allergic reaction of itching in the throat and ears        Medication List     TAKE these medications    doxycycline 100 MG tablet Commonly known as: VIBRA-TABS Take 1 tablet (100 mg total) by mouth 2 (two) times daily.   insulin aspart 100 UNIT/ML injection Commonly known as: novoLOG Inject 0.8 Units/hr into the skin See admin instructions. Use in insulin pump 0.6  units/hr plus boluses of 0-10 units for meals.   lactobacillus acidophilus & bulgar chewable tablet Chew 1 tablet by mouth 3 (three) times daily with meals.   lisinopril 5 MG tablet Commonly known as: ZESTRIL Take 5 mg by mouth daily.   lovastatin 20 MG tablet Commonly known as: MEVACOR Take 20 mg by mouth daily.   ondansetron 4 MG tablet Commonly known as: ZOFRAN Take 1 tablet (4 mg total) by mouth every 6 (six) hours as needed for up to 5 days for nausea.   oxyCODONE 5 MG immediate release tablet Commonly known as: Oxy IR/ROXICODONE Take 1 tablet (5 mg total) by mouth every 6 (six) hours as needed for up to 5 days  for moderate pain (pain score 4-6).               Durable Medical Equipment  (From admission, onward)           Start     Ordered   04/22/23 0823  For home use only DME Walker rolling  Once       Question Answer Comment  Walker: With 5 Inch Wheels   Patient needs a walker to treat with the following condition Gait instability      04/22/23 0822              Discharge Care Instructions  (From admission, onward)           Start     Ordered   04/21/23 0000  Touch down weight bearing       Question Answer Comment  Laterality left   Extremity Lower      04/21/23 0953             The results of significant diagnostics from this hospitalization (including imaging, microbiology, ancillary and laboratory) are listed below for reference.    Procedures and Diagnostic Studies:   MRI Left foot without contrast Result Date: 04/19/2023 CLINICAL DATA:  Osteonecrosis suspected, foot, xray done History of diabetes with 2nd toe ulceration. Clinical concern for osteomyelitis. EXAM: MRI OF THE LEFT FOOT WITHOUT CONTRAST TECHNIQUE: Multiplanar, multisequence MR imaging of the left forefoot was performed. No intravenous contrast was administered. COMPARISON:  Radiographs 04/17/2023. FINDINGS: Bones/Joint/Cartilage Soft tissue swelling and soft  tissue emphysema throughout the 2nd toe appear progressive from recent radiographs. There are underlying marrow changes within the head of the 2nd proximal phalanx and throughout the 2nd middle and distal phalanges with mildly increased T2 and decreased T1 signal. There are possible small foci of intraosseous air within these phalanges. No gross cortical destruction. No significant associated interphalangeal joint effusions. No other significant osseous abnormalities are identified within the left forefoot. There are small nonspecific effusions of the 1st and 2nd metatarsophalangeal joints. The tibial sesamoid of the 1st metatarsal appears bipartite. Ligaments Intact Lisfranc ligament. The collateral ligaments of the metatarsophalangeal joints appear intact. Muscles and Tendons Mild T2 hyperintensity throughout the forefoot musculature, likely related to diabetes. The forefoot tendons appear intact, without significant tenosynovitis. Soft tissues As above, soft tissue swelling and soft tissue emphysema throughout the 2nd toe, which appears increased from recent radiographs, suspicious for necrotizing soft tissue infection. No focal fluid collections are identified. IMPRESSION: 1. Progressive soft tissue swelling and soft tissue emphysema throughout the 2nd toe, suspicious for necrotizing soft tissue infection. No focal abscess. 2. Marrow changes within the head of the 2nd proximal phalanx and throughout the 2nd middle and distal phalanges, suspicious for early osteomyelitis. No gross cortical destruction. 3. No other significant osseous abnormalities identified. 4. Small nonspecific effusions of the 1st and 2nd metatarsophalangeal joints. Electronically Signed   By: Carey Bullocks M.D.   On: 04/19/2023 08:07     Labs:   Basic Metabolic Panel: Recent Labs  Lab 04/18/23 2050 04/19/23 0620 04/20/23 0528 04/21/23 0612 04/23/23 0536  NA 140 142 140 141 139  K 3.7 3.7 3.9 4.0 3.7  CL 108 111 110 110 106   CO2 25 24 21* 22 26  GLUCOSE 179* 166* 159* 115* 141*  BUN 11 8 8 10  5*  CREATININE 0.90 0.79 0.89 1.00 0.81  CALCIUM 9.3 8.9 8.8* 8.8* 8.7*   GFR Estimated Creatinine Clearance: 98.2 mL/min (by C-G formula based  on SCr of 0.81 mg/dL). Liver Function Tests: Recent Labs  Lab 04/18/23 2050  AST 20  ALT 12  ALKPHOS 126  BILITOT 0.5  PROT 7.5  ALBUMIN 2.9*   No results for input(s): "LIPASE", "AMYLASE" in the last 168 hours. No results for input(s): "AMMONIA" in the last 168 hours. Coagulation profile No results for input(s): "INR", "PROTIME" in the last 168 hours.  CBC: Recent Labs  Lab 04/18/23 2050 04/19/23 0620 04/20/23 0528 04/21/23 0612 04/23/23 0536  WBC 27.5* 23.0* 19.2* 21.4* 14.7*  NEUTROABS 23.4*  --   --   --  10.6*  HGB 11.9* 10.7* 11.0* 11.2* 11.1*  HCT 36.9* 32.9* 34.2* 34.6* 34.4*  MCV 83.5 81.2 82.6 82.4 83.1  PLT 443* 417* 493* 489* 528*   Cardiac Enzymes: No results for input(s): "CKTOTAL", "CKMB", "CKMBINDEX", "TROPONINI" in the last 168 hours. BNP: Invalid input(s): "POCBNP" CBG: Recent Labs  Lab 04/22/23 0949 04/22/23 1118 04/22/23 1617 04/22/23 2217 04/23/23 0548  GLUCAP 178* 112* 117* 217* 132*   D-Dimer No results for input(s): "DDIMER" in the last 72 hours. Hgb A1c No results for input(s): "HGBA1C" in the last 72 hours. Lipid Profile No results for input(s): "CHOL", "HDL", "LDLCALC", "TRIG", "CHOLHDL", "LDLDIRECT" in the last 72 hours. Thyroid function studies No results for input(s): "TSH", "T4TOTAL", "T3FREE", "THYROIDAB" in the last 72 hours.  Invalid input(s): "FREET3" Anemia work up No results for input(s): "VITAMINB12", "FOLATE", "FERRITIN", "TIBC", "IRON", "RETICCTPCT" in the last 72 hours. Microbiology Recent Results (from the past 240 hours)  Blood Cultures x 2 sites     Status: None   Collection Time: 04/18/23  8:49 PM   Specimen: BLOOD LEFT FOREARM  Result Value Ref Range Status   Specimen Description BLOOD LEFT  FOREARM  Final   Special Requests   Final    BOTTLES DRAWN AEROBIC AND ANAEROBIC Blood Culture results may not be optimal due to an inadequate volume of blood received in culture bottles   Culture   Final    NO GROWTH 5 DAYS Performed at [facility], [address], [place], [place] [address]    Report Status 04/23/2023 FINAL  Final  Blood Cultures x 2 sites     Status: None   Collection Time: 04/18/23 10:00 PM   Specimen: BLOOD  Result Value Ref Range Status   Specimen Description BLOOD BLOOD LEFT HAND  Final   Special Requests   Final    BOTTLES DRAWN AEROBIC AND ANAEROBIC Blood Culture results may not be optimal due to an inadequate volume of blood received in culture bottles   Culture   Final    NO GROWTH 5 DAYS Performed at [facility], [address] [place], [place] [address]    Report Status 04/23/2023 FINAL  Final  Surgical pcr screen     Status: None   Collection Time: 04/20/23 12:47 AM   Specimen: Nasal Mucosa; Nasal Swab  Result Value Ref Range Status   MRSA, PCR NEGATIVE NEGATIVE Final   Staphylococcus aureus NEGATIVE NEGATIVE Final    Comment: (NOTE) The Xpert SA Assay (FDA approved for NASAL specimens in patients 34 years of age and older), is one component of a comprehensive surveillance program. It is not intended to diagnose infection nor to guide or monitor treatment. Performed at [facility], [address] [place], [place] [address]   Aerobic/Anaerobic Culture w Gram Stain (surgical/deep wound)     Status: None (Preliminary result)   Collection  Time: 04/20/23  8:03 AM   Specimen: PATH Soft tissue  Result Value Ref Range Status   Specimen Description ABSCESS  Final   Special Requests left foot  Final   Gram Stain RARE WBC SEEN FEW GRAM POSITIVE COCCI   Final   Culture   Final    HOLDING FOR POSSIBLE ANAEROBE Performed at [facility], [address] [place], [place] [address]    Report Status PENDING  Incomplete     Time coordinating discharge: 45 minutes  Signed: Kiyonna Tortorelli  [facility] 04/23/2023, 11:25 AM

## 2023-04-30 ENCOUNTER — Ambulatory Visit (INDEPENDENT_AMBULATORY_CARE_PROVIDER_SITE_OTHER): Payer: BC Managed Care – PPO | Admitting: Family

## 2023-04-30 DIAGNOSIS — I96 Gangrene, not elsewhere classified: Secondary | ICD-10-CM

## 2023-04-30 MED ORDER — OXYCODONE HCL 5 MG PO TABS: 5.0000 mg | ORAL_TABLET | Freq: Four times a day (QID) | ORAL | 0 refills | Status: DC | PRN

## 2023-04-30 MED ORDER — SULFAMETHOXAZOLE-TRIMETHOPRIM 800-160 MG PO TABS: 1.0000 | ORAL_TABLET | Freq: Two times a day (BID) | ORAL | 0 refills | Status: DC

## 2023-04-30 NOTE — Progress Notes (Signed)
 Post-Op Visit Note   Patient: Danny Vargas           Date of Birth: June 06, 1970           MRN: [id number] Visit Date: 04/30/2023 PCP: Karolee Pierce, MD  Chief Complaint:  Chief Complaint  Patient presents with   Left Foot - Routine Post Op    04/20/23 LT 2nd toe amputation    HPI:  HPI The patient is a 52 year old gentleman seen status post left second toe amputation is in a postop shoe full weightbearing Ortho Exam On examination left foot there is 1 area of dehiscence and a failed suture this is harvested today the wound bed is filled in with fibrinous tissue.  There is mild erythema there is no purulence no odor Visit Diagnoses: No diagnosis found.  Plan: Will place on a course of Bactrim .  Refilled pain medication begin daily dose of cleansing.  Dry dressings.  Minimize weightbearing. Follow-Up Instructions: Return in about 13 days (around 05/13/2023).   Imaging: No results found.  Orders:  No orders of the defined types were placed in this encounter.  Meds ordered this encounter  Medications   sulfamethoxazole -trimethoprim  (BACTRIM  DS) 800-160 MG tablet    Sig: Take 1 tablet by mouth 2 (two) times daily.    Dispense:  20 tablet    Refill:  0   oxyCODONE  (OXY IR/ROXICODONE ) 5 MG immediate release tablet    Sig: Take 1 tablet (5 mg total) by mouth every 6 (six) hours as needed for severe pain (pain score 7-10).    Dispense:  28 tablet    Refill:  0     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 abuse (HCC) 08/19/2020   Uncontrolled type 2 diabetes mellitus with hyperglycemia, with long-term current use of insulin  (HCC) 08/19/2020   Acute kidney injury (nontraumatic) (HCC)     Intractable vomiting 08/18/2020   Esophagitis 12/16/2017   Hematemesis 12/14/2017   HTN (hypertension) 12/14/2017   HLD (hyperlipidemia) 12/14/2017   Marijuana abuse 12/14/2017   Upper GI bleed 12/01/2017   Gastroparesis due to DM (HCC) 12/01/2017   Leukocytosis 12/01/2017   Diabetes (HCC) 10/28/2013   GERD (gastroesophageal reflux disease) 10/28/2013   Nausea & vomiting 10/28/2013   DKA (diabetic ketoacidosis) (HCC) 10/28/2013   Past Medical History:  Diagnosis Date   Diabetes mellitus    GERD (gastroesophageal reflux disease)    HLD (hyperlipidemia) 12/14/2017   HTN (hypertension) 12/14/2017   Marijuana abuse 12/14/2017    Family History  Problem Relation Age of Onset   Diabetes Mother     Past Surgical History:  Procedure Laterality Date   AMPUTATION TOE Left 04/20/2023   Procedure: LEFT SECOND TOE AMPUTATION;  Surgeon: Harden Jerona GAILS, MD;  Location: [facility] OR;  Service: Orthopedics;  Laterality: Left;   ESOPHAGOGASTRODUODENOSCOPY (EGD) WITH PROPOFOL  N/A 12/16/2017   Procedure: ESOPHAGOGASTRODUODENOSCOPY (EGD) WITH PROPOFOL ;  Surgeon: Celestia Agent, MD;  Location: [facility] ENDOSCOPY;  Service: Endoscopy;  Laterality: N/A;   KNEE SURGERY     Social History   Occupational History   Not on file  Tobacco Use   Smoking status: Every Day    Current packs/day: 1.00    Types: Cigarettes  Smokeless tobacco: Never  Vaping Use   Vaping status: Never Used  Substance and Sexual Activity   Alcohol use: No   Drug use: No   Sexual activity: Yes

## 2023-05-10 ENCOUNTER — Ambulatory Visit (INDEPENDENT_AMBULATORY_CARE_PROVIDER_SITE_OTHER): Payer: BC Managed Care – PPO | Admitting: Family

## 2023-05-10 ENCOUNTER — Encounter: Payer: Self-pay | Admitting: Family

## 2023-05-10 DIAGNOSIS — L03116 Cellulitis of left lower limb: Secondary | ICD-10-CM

## 2023-05-10 DIAGNOSIS — I96 Gangrene, not elsewhere classified: Secondary | ICD-10-CM

## 2023-05-10 MED ORDER — CLINDAMYCIN HCL 150 MG PO CAPS: 150.0000 mg | ORAL_CAPSULE | Freq: Three times a day (TID) | ORAL | 0 refills | Status: AC

## 2023-05-10 NOTE — Progress Notes (Signed)
 Post-Op Visit Note   Patient: Danny Vargas           Date of Birth: May 27, 1970           MRN: [id number] Visit Date: 05/10/2023 PCP: Karolee Pierce, MD  Chief Complaint:  Chief Complaint  Patient presents with   Left Foot - Routine Post Op    04/20/23 LT 2nd toe amputation    HPI:  HPI The patient is a 53 year old gentleman seen status post left second toe amputation unfortunately he has an area of dehiscence he is currently completing a course of Bactrim  which was began on December 31.  He continues to have some clear drainage and tenderness to the forefoot no fever no chills  His current postop shoe worn out broken down the sole has completely split Ortho Exam On examination left foot there is maceration about the incision the proximal 1 cm has healed in the webspace there is an area of dehiscence this is gaped there is no exposed bone today's mild surrounding erythema and tenderness  Visit Diagnoses: No diagnosis found.  Plan: Patient has failed doxycycline  and Bactrim  placed on clindamycin .  He will continue daily dose of cleansing dry dressings packing the webspace open minimizing weightbearing given a new postop shoe today  Follow-Up Instructions: Return in about 2 weeks (around 05/24/2023).   Imaging: No results found.  Orders:  No orders of the defined types were placed in this encounter.  Meds ordered this encounter  Medications   clindamycin  (CLEOCIN ) 150 MG capsule    Sig: Take 1 capsule (150 mg total) by mouth 3 (three) times daily.    Dispense:  30 capsule    Refill:  0     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 abuse (HCC) 08/19/2020   Uncontrolled type 2  diabetes mellitus with hyperglycemia, with long-term current use of insulin  (HCC) 08/19/2020   Acute kidney injury (nontraumatic) (HCC)    Intractable vomiting 08/18/2020   Esophagitis 12/16/2017   Hematemesis 12/14/2017   HTN (hypertension) 12/14/2017   HLD (hyperlipidemia) 12/14/2017   Marijuana abuse 12/14/2017   Upper GI bleed 12/01/2017   Gastroparesis due to DM (HCC) 12/01/2017   Leukocytosis 12/01/2017   Diabetes (HCC) 10/28/2013   GERD (gastroesophageal reflux disease) 10/28/2013   Nausea & vomiting 10/28/2013   DKA (diabetic ketoacidosis) (HCC) 10/28/2013   Past Medical History:  Diagnosis Date   Diabetes mellitus    GERD (gastroesophageal reflux disease)    HLD (hyperlipidemia) 12/14/2017   HTN (hypertension) 12/14/2017   Marijuana abuse 12/14/2017    Family History  Problem Relation Age of Onset   Diabetes Mother     Past Surgical History:  Procedure Laterality Date   AMPUTATION TOE Left 04/20/2023   Procedure: LEFT SECOND TOE AMPUTATION;  Surgeon: Harden Jerona GAILS, MD;  Location: [facility] OR;  Service: Orthopedics;  Laterality: Left;   ESOPHAGOGASTRODUODENOSCOPY (EGD) WITH PROPOFOL  N/A 12/16/2017   Procedure: ESOPHAGOGASTRODUODENOSCOPY (EGD) WITH PROPOFOL ;  Surgeon: Celestia Agent, MD;  Location: [facility] ENDOSCOPY;  Service: Endoscopy;  Laterality: N/A;   KNEE SURGERY     Social History   Occupational History   Not on file  Tobacco Use   Smoking status: Every Day  Current packs/day: 1.00    Types: Cigarettes   Smokeless tobacco: Never  Vaping Use   Vaping status: Never Used  Substance and Sexual Activity   Alcohol use: No   Drug use: No   Sexual activity: Yes

## 2023-05-21 ENCOUNTER — Ambulatory Visit: Payer: BC Managed Care – PPO | Admitting: Family

## 2023-05-21 ENCOUNTER — Ambulatory Visit: Payer: BC Managed Care – PPO | Admitting: Orthopedic Surgery

## 2023-05-21 DIAGNOSIS — I96 Gangrene, not elsewhere classified: Secondary | ICD-10-CM

## 2023-05-21 MED ORDER — OXYCODONE HCL 5 MG PO TABS: 5.0000 mg | ORAL_TABLET | Freq: Four times a day (QID) | ORAL | 0 refills | Status: DC | PRN

## 2023-05-22 ENCOUNTER — Encounter: Payer: Self-pay | Admitting: Orthopedic Surgery

## 2023-05-22 NOTE — Progress Notes (Signed)
Office Visit Note   Patient: Danny Vargas           Date of Birth: 05-16-1970           MRN: [id number] Visit Date: 05/21/2023              Requested by: Teodoro Spray, MD [address] [place],  [place] [address] PCP: Teodoro Spray, MD  Chief Complaint  Patient presents with   Left Foot - Routine Post Op    04/20/2023 left 2nd ray amputation       HPI: Patient is a 53 year old gentleman who presents 4 weeks status post left foot second ray amputation.  Patient is currently full weightbearing.  Assessment & Plan: Visit Diagnoses:  1. Gangrene of toe of left foot (HCC)     Plan: Recommended elevation Dial soap cleansing dry dressing changes daily.  Recommend that he uses walker to minimize weightbearing on the foot.  Follow-Up Instructions: Return in about 2 weeks (around 06/04/2023).   Ortho Exam  Patient is alert, oriented, no adenopathy, well-dressed, normal affect, normal respiratory effort. Examination patient has wound dehiscence from swelling.  There is periwound macerated tissue from the drainage.  There is no exposed bone or tendon no cellulitis.  Imaging: No results found.    Labs: Lab Results  Component Value Date   HGBA1C 9.6 (H) 04/19/2023   HGBA1C 9.2 (H) 08/19/2020   HGBA1C 10.9 (H) 12/01/2017   ESRSEDRATE 80 (H) 04/18/2023   CRP 21.3 (H) 04/18/2023   REPTSTATUS 04/23/2023 FINAL 04/20/2023   GRAMSTAIN  04/20/2023    RARE WBC SEEN FEW GRAM POSITIVE COCCI Performed at [facility], [address] [place], [place] [address]    CULT FEW Mindi Junker 04/20/2023     Lab Results  Component Value Date   ALBUMIN 2.9 (L) 04/18/2023   ALBUMIN 3.5 08/21/2020   ALBUMIN 3.4 (L) 08/20/2020   PREALBUMIN <5 (L) 04/19/2023    Lab Results  Component Value Date   MG 1.8 08/22/2020   MG 1.9 08/21/2020   MG 2.0 08/20/2020   No results found for: "VD25OH"  Lab Results  Component Value Date   PREALBUMIN <5 (L) 04/19/2023       Latest Ref Rng & Units 04/23/2023    5:36 AM 04/21/2023    6:12 AM 04/20/2023    5:28 AM  CBC EXTENDED  WBC 4.0 - 10.5 K/uL 14.7  21.4  19.2   RBC 4.22 - 5.81 MIL/uL 4.14  4.20  4.14   Hemoglobin 13.0 - 17.0 g/dL 29.5  28.4  13.2   HCT 39.0 - 52.0 % 34.4  34.6  34.2   Platelets 150 - 400 K/uL 528  489  493   NEUT# 1.7 - 7.7 K/uL 10.6     Lymph# 0.7 - 4.0 K/uL 2.2        There is no height or weight on file to calculate BMI.  Orders:  No orders of the defined types were placed in this encounter.  Meds ordered this encounter  Medications   oxyCODONE (OXY IR/ROXICODONE) 5 MG immediate release tablet    Sig: Take 1 tablet (5 mg total) by mouth every 6 (six) hours as needed for severe pain (pain score 7-10).    Dispense:  28 tablet    Refill:  0     Procedures: No procedures performed  Clinical Data: No additional findings.  ROS:  All other systems negative, except as noted in the  HPI. Review of Systems  Objective: Vital Signs: There were no vitals taken for this visit.  Specialty Comments:  No specialty comments available.  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 abuse (HCC) 08/19/2020   Uncontrolled type 2 diabetes mellitus with hyperglycemia, with long-term current use of insulin (HCC) 08/19/2020   Acute kidney injury (nontraumatic) (HCC)    Intractable vomiting 08/18/2020   Esophagitis 12/16/2017   Hematemesis 12/14/2017   HTN (hypertension) 12/14/2017   HLD (hyperlipidemia) 12/14/2017   Marijuana abuse 12/14/2017   Upper GI bleed 12/01/2017   Gastroparesis due to DM (HCC) 12/01/2017   Leukocytosis 12/01/2017   Diabetes (HCC) 10/28/2013   GERD (gastroesophageal reflux  disease) 10/28/2013   Nausea & vomiting 10/28/2013   DKA (diabetic ketoacidosis) (HCC) 10/28/2013   Past Medical History:  Diagnosis Date   Diabetes mellitus    GERD (gastroesophageal reflux disease)    HLD (hyperlipidemia) 12/14/2017   HTN (hypertension) 12/14/2017   Marijuana abuse 12/14/2017    Family History  Problem Relation Age of Onset   Diabetes Mother     Past Surgical History:  Procedure Laterality Date   AMPUTATION TOE Left 04/20/2023   Procedure: LEFT SECOND TOE AMPUTATION;  Surgeon: Nadara Mustard, MD;  Location: [facility] OR;  Service: Orthopedics;  Laterality: Left;   ESOPHAGOGASTRODUODENOSCOPY (EGD) WITH PROPOFOL N/A 12/16/2017   Procedure: ESOPHAGOGASTRODUODENOSCOPY (EGD) WITH PROPOFOL;  Surgeon: Carman Ching, MD;  Location: [facility] ENDOSCOPY;  Service: Endoscopy;  Laterality: N/A;   KNEE SURGERY     Social History   Occupational History   Not on file  Tobacco Use   Smoking status: Every Day    Current packs/day: 1.00    Types: Cigarettes   Smokeless tobacco: Never  Vaping Use   Vaping status: Never Used  Substance and Sexual Activity   Alcohol use: No   Drug use: No   Sexual activity: Yes

## 2023-06-06 ENCOUNTER — Encounter (HOSPITAL_COMMUNITY): Payer: Self-pay | Admitting: Orthopedic Surgery

## 2023-06-06 ENCOUNTER — Encounter: Payer: Self-pay | Admitting: Orthopedic Surgery

## 2023-06-06 ENCOUNTER — Other Ambulatory Visit: Payer: Self-pay

## 2023-06-06 ENCOUNTER — Ambulatory Visit: Payer: BC Managed Care – PPO | Admitting: Orthopedic Surgery

## 2023-06-06 DIAGNOSIS — L02612 Cutaneous abscess of left foot: Secondary | ICD-10-CM

## 2023-06-06 NOTE — Progress Notes (Signed)
 PCP - [facility] Sharl Delon NOVAK., NP    Cardiologist -   PPM/ICD - denies Device Orders - n/a Rep Notified - n/a  Chest x-ray - denies EKG - DOS Stress Test - denies ECHO - denies Cardiac Cath - denies  CPAP - denies    Fasting Blood Sugar - Per patient has a medtronic. Per patient blood sugars are between 185-220 Checks Blood Sugar per patient has a medtronic to abdomen   Blood Thinner Instructions: denies Aspirin  Instructions: n/a  ERAS Protcol - clear liquids until 8:15  COVID TEST- no  Anesthesia review: no  Patient verbally denies any shortness of breath, fever, cough and chest pain during phone call   -------------  SDW INSTRUCTIONS given:  Your procedure is scheduled on June 07, 2023.  Report to [facility] Main Entrance A at 8:45 A.M., and check in at the Admitting office.  Call this number if you have problems the morning of surgery:  [phone]   Remember:  Do not eat after midnight the night before your surgery  You may drink clear liquids until 8:15 the morning of your surgery.   Clear liquids allowed are: Water, Non-Citrus Juices (without pulp), Carbonated Beverages, Clear Tea, Black Coffee Only, and Gatorade    Take these medicines the morning of surgery with A SIP OF WATER  lovastatin (MEVACOR)  oxyCODONE  (OXY IR/ROXICODONE )   WHAT DO I DO ABOUT MY DIABETES MEDICATION?   Do not take oral diabetes medicines (pills) the morning of surgery.  THE NIGHT BEFORE SURGERY, take ___________ units of ___________insulin.       THE MORNING OF SURGERY, take _____________ units of __________insulin.  The day of surgery, do not take other diabetes injectables, including Byetta (exenatide), Bydureon (exenatide ER), Victoza (liraglutide), or Trulicity (dulaglutide).  If your CBG is greater than 220 mg/dL, you may take  of your sliding scale (correction) dose of insulin .   HOW TO MANAGE YOUR DIABETES BEFORE AND AFTER SURGERY  Why is it  important to control my blood sugar before and after surgery? Improving blood sugar levels before and after surgery helps healing and can limit problems. A way of improving blood sugar control is eating a healthy diet by:  Eating less sugar and carbohydrates  Increasing activity/exercise  Talking with your doctor about reaching your blood sugar goals High blood sugars (greater than 180 mg/dL) can raise your risk of infections and slow your recovery, so you will need to focus on controlling your diabetes during the weeks before surgery. Make sure that the doctor who takes care of your diabetes knows about your planned surgery including the date and location.  How do I manage my blood sugar before surgery? Check your blood sugar at least 4 times a day, starting 2 days before surgery, to make sure that the level is not too high or low.  Check your blood sugar the morning of your surgery when you wake up and every 2 hours until you get to the Short Stay unit.  If your blood sugar is less than 70 mg/dL, you will need to treat for low blood sugar: Do not take insulin . Treat a low blood sugar (less than 70 mg/dL) with  cup of clear juice (cranberry or apple), 4 glucose tablets, OR glucose gel. Recheck blood sugar in 15 minutes after treatment (to make sure it is greater than 70 mg/dL). If your blood sugar is not greater than 70 mg/dL on recheck, call [phone] for further instructions. Report your  blood sugar to the short stay nurse when you get to Short Stay.  If you are admitted to the hospital after surgery: Your blood sugar will be checked by the staff and you will probably be given insulin  after surgery (instead of oral diabetes medicines) to make sure you have good blood sugar levels. The goal for blood sugar control after surgery is 80-180 mg/dL.   As of today, STOP taking any Aspirin  (unless otherwise instructed by your surgeon) Aleve, Naproxen, Ibuprofen, Motrin, Advil, Goody's, BC's,  all herbal medications, fish oil, and all vitamins.                      Do not wear jewelry, make up, or nail polish            Do not wear lotions, powders, perfumes/colognes, or deodorant.            Do not shave 48 hours prior to surgery.  Men may shave face and neck.            Do not bring valuables to the hospital.            [facility] is not responsible for any belongings or valuables.  Do NOT Smoke (Tobacco/Vaping) 24 hours prior to your procedure If you use a CPAP at night, you may bring all equipment for your overnight stay.   Contacts, glasses, dentures or bridgework may not be worn into surgery.      For patients admitted to the hospital, discharge time will be determined by your treatment team.   Patients discharged the day of surgery will not be allowed to drive home, and someone needs to stay with them for 24 hours.    Special instructions:   [place]- Preparing For Surgery  Before surgery, you can play an important role. Because skin is not sterile, your skin needs to be as free of germs as possible. You can reduce the number of germs on your skin by washing with CHG (chlorahexidine gluconate) Soap before surgery.  CHG is an antiseptic cleaner which kills germs and bonds with the skin to continue killing germs even after washing.    Oral Hygiene is also important to reduce your risk of infection.  Remember - BRUSH YOUR TEETH THE MORNING OF SURGERY WITH YOUR REGULAR TOOTHPASTE  Please do not use if you have an allergy to CHG or antibacterial soaps. If your skin becomes reddened/irritated stop using the CHG.  Do not shave (including legs and underarms) for at least 48 hours prior to first CHG shower. It is OK to shave your face.  Please follow these instructions carefully.   Shower the NIGHT BEFORE SURGERY and the MORNING OF SURGERY with DIAL Soap.   Pat yourself dry with a CLEAN TOWEL.  Wear CLEAN PAJAMAS to bed the night before surgery  Place CLEAN SHEETS  on your bed the night of your first shower and DO NOT SLEEP WITH PETS.   Day of Surgery: Please shower morning of surgery  Wear Clean/Comfortable clothing the morning of surgery Do not apply any deodorants/lotions.   Remember to brush your teeth WITH YOUR REGULAR TOOTHPASTE.   Questions were answered. Patient verbalized understanding of instructions.

## 2023-06-06 NOTE — Progress Notes (Signed)
 Spoke with Delon diabetes coordinator making her aware of patient having a meditronic insulin  pump and that patient is having surgery 06-07-23.  Made Delon aware that patient states he cannot change basal rate, she said the pump may be in auto mode and then he can not change rate.  Also per Zamyiah Tino patient should not have to do anything with pump for a 45 minute procedure.  She also stated they would follow up with patient in am.

## 2023-06-06 NOTE — Progress Notes (Signed)
 Office Visit Note   Patient: Danny Vargas           Date of Birth: [birth year]           MRN: [id number] Visit Date: 06/06/2023              Requested by: Karolee Pierce, MD [address] [place],  [place] [address] PCP: Karolee Pierce, MD  Chief Complaint  Patient presents with   Left Foot - Routine Post Op    04/20/2023 left 2nd ray amputation      HPI: Patient is a 53 year old gentleman who presents in follow-up status post left foot second ray amputation approximately 6 weeks out.  Patient has completed his oral antibiotics.  Patient states he still has odor and drainage and minimizing weightbearing.  Assessment & Plan: Visit Diagnoses:  1. Cutaneous abscess of left foot     Plan: With the persistent drainage and wound dehiscence will plan for surgical debridement tomorrow of the infection.  Will send soft tissue for cultures place antibiotic vancomycin  powder within the wound.  Risk and benefits were discussed including need for additional surgery.  Patient states he understands wished to proceed at this time.  Follow-Up Instructions: Return in about 1 week (around 06/13/2023).   Ortho Exam  Patient is alert, oriented, no adenopathy, well-dressed, normal affect, normal respiratory effort. Examination patient does have pitting edema in the left foot.  There is no cellulitis in the foot he does have a palpable pulse.  He has maceration around the second ray amputation with clear drainage.  Imaging: No results found. No images are attached to the encounter.  Labs: Lab Results  Component Value Date   HGBA1C 9.6 (H) 04/19/2023   HGBA1C 9.2 (H) 08/19/2020   HGBA1C 10.9 (H) 12/01/2017   ESRSEDRATE 80 (H) 04/18/2023   CRP 21.3 (H) 04/18/2023   REPTSTATUS 04/23/2023 FINAL 04/20/2023   GRAMSTAIN  04/20/2023    RARE WBC SEEN FEW GRAM POSITIVE COCCI Performed at [facility], [address], [place], [place] [address]    CULT FEW MICHAELENE STAPLE 04/20/2023      Lab Results  Component Value Date   ALBUMIN 2.9 (L) 04/18/2023   ALBUMIN 3.5 08/21/2020   ALBUMIN 3.4 (L) 08/20/2020   PREALBUMIN <5 (L) 04/19/2023    Lab Results  Component Value Date   MG 1.8 08/22/2020   MG 1.9 08/21/2020   MG 2.0 08/20/2020   No results found for: [facility]  Lab Results  Component Value Date   PREALBUMIN <5 (L) 04/19/2023      Latest Ref Rng & Units 04/23/2023    5:36 AM 04/21/2023    6:12 AM 04/20/2023    5:28 AM  CBC EXTENDED  WBC 4.0 - 10.5 K/uL 14.7  21.4  19.2   RBC 4.22 - 5.81 MIL/uL 4.14  4.20  4.14   Hemoglobin 13.0 - 17.0 g/dL 88.8  88.7  88.9   HCT 39.0 - 52.0 % 34.4  34.6  34.2   Platelets 150 - 400 K/uL 528  489  493   NEUT# 1.7 - 7.7 K/uL 10.6     Lymph# 0.7 - 4.0 K/uL 2.2        There is no height or weight on file to calculate BMI.  Orders:  No orders of the defined types were placed in this encounter.  No orders of the defined types were placed in this encounter.    Procedures: No procedures performed  Clinical Data: No additional findings.  ROS:  All other systems negative, except as noted in the HPI. Review of Systems  Objective: Vital Signs: There were no vitals taken for this visit.  Specialty Comments:  No specialty comments available.  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 abuse (HCC) 08/19/2020   Uncontrolled type 2 diabetes mellitus with hyperglycemia, with long-term current use of insulin  (HCC) 08/19/2020   Acute kidney injury (nontraumatic) (HCC)    Intractable vomiting 08/18/2020   Esophagitis 12/16/2017   Hematemesis 12/14/2017   HTN (hypertension) 12/14/2017   HLD (hyperlipidemia) 12/14/2017    Marijuana abuse 12/14/2017   Upper GI bleed 12/01/2017   Gastroparesis due to DM (HCC) 12/01/2017   Leukocytosis 12/01/2017   Diabetes (HCC) 10/28/2013   GERD (gastroesophageal reflux disease) 10/28/2013   Nausea & vomiting 10/28/2013   DKA (diabetic ketoacidosis) (HCC) 10/28/2013   Past Medical History:  Diagnosis Date   Diabetes mellitus    GERD (gastroesophageal reflux disease)    HLD (hyperlipidemia) 12/14/2017   HTN (hypertension) 12/14/2017   Marijuana abuse 12/14/2017    Family History  Problem Relation Age of Onset   Diabetes Mother     Past Surgical History:  Procedure Laterality Date   AMPUTATION TOE Left 04/20/2023   Procedure: LEFT SECOND TOE AMPUTATION;  Surgeon: Harden Jerona GAILS, MD;  Location: [facility] OR;  Service: Orthopedics;  Laterality: Left;   ESOPHAGOGASTRODUODENOSCOPY (EGD) WITH PROPOFOL  N/A 12/16/2017   Procedure: ESOPHAGOGASTRODUODENOSCOPY (EGD) WITH PROPOFOL ;  Surgeon: Celestia Agent, MD;  Location: [facility] ENDOSCOPY;  Service: Endoscopy;  Laterality: N/A;   KNEE SURGERY     Social History   Occupational History   Not on file  Tobacco Use   Smoking status: Every Day    Current packs/day: 1.00    Types: Cigarettes   Smokeless tobacco: Never  Vaping Use   Vaping status: Never Used  Substance and Sexual Activity   Alcohol use: No   Drug use: No   Sexual activity: Yes

## 2023-06-07 ENCOUNTER — Encounter (HOSPITAL_COMMUNITY): Payer: Self-pay | Admitting: Orthopedic Surgery

## 2023-06-07 ENCOUNTER — Encounter (HOSPITAL_COMMUNITY)
Admission: RE | Disposition: A | Discharge status: Home / Self Care | Payer: Self-pay | Source: Home / Self Care | Attending: Orthopedic Surgery

## 2023-06-07 ENCOUNTER — Ambulatory Visit (HOSPITAL_COMMUNITY): Payer: BC Managed Care – PPO | Admitting: Anesthesiology

## 2023-06-07 ENCOUNTER — Other Ambulatory Visit: Payer: Self-pay

## 2023-06-07 ENCOUNTER — Ambulatory Visit (HOSPITAL_COMMUNITY)
Admission: RE | Admit: 2023-06-07 | Discharge: 2023-06-07 | Disposition: A | Discharge status: Home / Self Care | Payer: BC Managed Care – PPO | Attending: Orthopedic Surgery | Admitting: Orthopedic Surgery

## 2023-06-07 DIAGNOSIS — L03116 Cellulitis of left lower limb: Secondary | ICD-10-CM

## 2023-06-07 DIAGNOSIS — M86172 Other acute osteomyelitis, left ankle and foot: Secondary | ICD-10-CM

## 2023-06-07 DIAGNOSIS — L02612 Cutaneous abscess of left foot: Secondary | ICD-10-CM | POA: Diagnosis present

## 2023-06-07 DIAGNOSIS — I96 Gangrene, not elsewhere classified: Secondary | ICD-10-CM | POA: Diagnosis not present

## 2023-06-07 DIAGNOSIS — I10 Essential (primary) hypertension: Secondary | ICD-10-CM | POA: Diagnosis not present

## 2023-06-07 DIAGNOSIS — E1065 Type 1 diabetes mellitus with hyperglycemia: Secondary | ICD-10-CM | POA: Insufficient documentation

## 2023-06-07 HISTORY — PX: I & D EXTREMITY: SHX5045

## 2023-06-07 LAB — GLUCOSE, CAPILLARY
Glucose-Capillary: 248 mg/dL — ABNORMAL HIGH (ref 70–99)
Glucose-Capillary: 302 mg/dL — ABNORMAL HIGH (ref 70–99)
Glucose-Capillary: 296 mg/dL — ABNORMAL HIGH (ref 70–99)
Glucose-Capillary: 239 mg/dL — ABNORMAL HIGH (ref 70–99)
Glucose-Capillary: 244 mg/dL — ABNORMAL HIGH (ref 70–99)
Glucose-Capillary: 188 mg/dL — ABNORMAL HIGH (ref 70–99)

## 2023-06-07 SURGERY — IRRIGATION AND DEBRIDEMENT EXTREMITY
Anesthesia: Monitor Anesthesia Care | Laterality: Left

## 2023-06-07 MED ORDER — ONDANSETRON HCL 4 MG/2ML IJ SOLN
INTRAMUSCULAR | Status: AC
  Filled 2023-06-07: qty 2

## 2023-06-07 MED ORDER — PROPOFOL 10 MG/ML IV BOLUS
INTRAVENOUS | Status: DC | PRN
  Administered 2023-06-07: 100 ug/kg/min via INTRAVENOUS
  Administered 2023-06-07: 30 mg via INTRAVENOUS

## 2023-06-07 MED ORDER — CEFAZOLIN SODIUM-DEXTROSE 2-4 GM/100ML-% IV SOLN
2.0000 g | INTRAVENOUS | Status: AC
  Administered 2023-06-07: 2 g via INTRAVENOUS

## 2023-06-07 MED ORDER — VANCOMYCIN HCL 1000 MG IV SOLR
INTRAVENOUS | Status: DC | PRN
  Administered 2023-06-07: 1000 mg via TOPICAL

## 2023-06-07 MED ORDER — FENTANYL CITRATE (PF) 250 MCG/5ML IJ SOLN
INTRAMUSCULAR | Status: DC | PRN
  Administered 2023-06-07 (×2): 25 ug via INTRAVENOUS
  Administered 2023-06-07: 50 ug via INTRAVENOUS
  Administered 2023-06-07: 25 ug via INTRAVENOUS

## 2023-06-07 MED ORDER — FENTANYL CITRATE (PF) 250 MCG/5ML IJ SOLN
INTRAMUSCULAR | Status: AC
  Filled 2023-06-07: qty 5

## 2023-06-07 MED ORDER — VASHE WOUND IRRIGATION OPTIME
TOPICAL | Status: DC | PRN
  Administered 2023-06-07: 34 [oz_av]

## 2023-06-07 MED ORDER — INSULIN ASPART 100 UNIT/ML IJ SOLN: 8.0000 [IU] | Freq: Once | INTRAMUSCULAR | Status: DC

## 2023-06-07 MED ORDER — 0.9 % SODIUM CHLORIDE (POUR BTL) OPTIME
TOPICAL | Status: DC | PRN
  Administered 2023-06-07: 1000 mL

## 2023-06-07 MED ORDER — OXYCODONE-ACETAMINOPHEN 5-325 MG PO TABS: 1.0000 | ORAL_TABLET | ORAL | 0 refills | Status: DC | PRN

## 2023-06-07 MED ORDER — INSULIN ASPART 100 UNIT/ML IJ SOLN
INTRAMUSCULAR | Status: AC
  Administered 2023-06-07: 6 [IU] via SUBCUTANEOUS
  Filled 2023-06-07: qty 1

## 2023-06-07 MED ORDER — BUPIVACAINE HCL (PF) 0.25 % IJ SOLN
INTRAMUSCULAR | Status: DC | PRN
  Administered 2023-06-07: 10 mL

## 2023-06-07 MED ORDER — PHENYLEPHRINE 80 MCG/ML (10ML) SYRINGE FOR IV PUSH (FOR BLOOD PRESSURE SUPPORT)
PREFILLED_SYRINGE | INTRAVENOUS | Status: DC | PRN
  Administered 2023-06-07: 160 ug via INTRAVENOUS
  Administered 2023-06-07: 80 ug via INTRAVENOUS

## 2023-06-07 MED ORDER — MIDAZOLAM HCL 2 MG/2ML IJ SOLN
INTRAMUSCULAR | Status: DC | PRN
  Administered 2023-06-07: 1 mg via INTRAVENOUS

## 2023-06-07 MED ORDER — HYDROMORPHONE HCL 1 MG/ML IJ SOLN: 0.2500 mg | INTRAMUSCULAR | Status: DC | PRN

## 2023-06-07 MED ORDER — ACETAMINOPHEN 500 MG PO TABS
ORAL_TABLET | ORAL | Status: AC
  Filled 2023-06-07: qty 2

## 2023-06-07 MED ORDER — PROPOFOL 1000 MG/100ML IV EMUL
INTRAVENOUS | Status: AC
  Filled 2023-06-07: qty 100

## 2023-06-07 MED ORDER — ACETAMINOPHEN 500 MG PO TABS
1000.0000 mg | ORAL_TABLET | Freq: Once | ORAL | Status: AC
  Administered 2023-06-07: 1000 mg via ORAL

## 2023-06-07 MED ORDER — ONDANSETRON HCL 4 MG/2ML IJ SOLN
INTRAMUSCULAR | Status: DC | PRN
  Administered 2023-06-07: 4 mg via INTRAVENOUS

## 2023-06-07 MED ORDER — BUPIVACAINE HCL (PF) 0.25 % IJ SOLN
INTRAMUSCULAR | Status: AC
  Filled 2023-06-07: qty 30

## 2023-06-07 MED ORDER — MIDAZOLAM HCL 2 MG/2ML IJ SOLN
INTRAMUSCULAR | Status: AC
  Filled 2023-06-07: qty 2

## 2023-06-07 MED ORDER — LACTATED RINGERS IV SOLN: INTRAVENOUS | Status: DC

## 2023-06-07 MED ORDER — CEFAZOLIN SODIUM-DEXTROSE 2-4 GM/100ML-% IV SOLN
INTRAVENOUS | Status: AC
  Filled 2023-06-07: qty 100

## 2023-06-07 MED ORDER — CHLORHEXIDINE GLUCONATE 0.12 % MT SOLN: 15.0000 mL | Freq: Once | OROMUCOSAL | Status: AC

## 2023-06-07 MED ORDER — PHENYLEPHRINE 80 MCG/ML (10ML) SYRINGE FOR IV PUSH (FOR BLOOD PRESSURE SUPPORT)
PREFILLED_SYRINGE | INTRAVENOUS | Status: AC
  Filled 2023-06-07: qty 10

## 2023-06-07 MED ORDER — VANCOMYCIN HCL 1000 MG IV SOLR
INTRAVENOUS | Status: AC
  Filled 2023-06-07: qty 20

## 2023-06-07 MED ORDER — INSULIN ASPART 100 UNIT/ML IJ SOLN: 6.0000 [IU] | Freq: Once | INTRAMUSCULAR | Status: AC

## 2023-06-07 MED ORDER — CHLORHEXIDINE GLUCONATE 0.12 % MT SOLN
OROMUCOSAL | Status: AC
  Administered 2023-06-07: 15 mL via OROMUCOSAL
  Filled 2023-06-07: qty 15

## 2023-06-07 MED ORDER — ORAL CARE MOUTH RINSE: 15.0000 mL | Freq: Once | OROMUCOSAL | Status: AC

## 2023-06-07 SURGICAL SUPPLY — 30 items
BAG COUNTER SPONGE SURGICOUNT (BAG) IMPLANT
BLADE SURG 21 STRL SS (BLADE) ×2 IMPLANT
BNDG COHESIVE 6X5 TAN ST LF (GAUZE/BANDAGES/DRESSINGS) IMPLANT
BNDG GAUZE DERMACEA FLUFF 4 (GAUZE/BANDAGES/DRESSINGS) ×4 IMPLANT
CLEANSER WND VASHE 34 (WOUND CARE) IMPLANT
COVER SURGICAL LIGHT HANDLE (MISCELLANEOUS) ×4 IMPLANT
DRAPE U-SHAPE 47X51 STRL (DRAPES) ×2 IMPLANT
DRSG ADAPTIC 3X8 NADH LF (GAUZE/BANDAGES/DRESSINGS) ×2 IMPLANT
DURAPREP 26ML APPLICATOR (WOUND CARE) ×2 IMPLANT
ELECT REM PT RETURN 9FT ADLT (ELECTROSURGICAL) IMPLANT
ELECTRODE REM PT RTRN 9FT ADLT (ELECTROSURGICAL) IMPLANT
GAUZE PAD ABD 8X10 STRL (GAUZE/BANDAGES/DRESSINGS) IMPLANT
GAUZE SPONGE 4X4 12PLY STRL (GAUZE/BANDAGES/DRESSINGS) ×2 IMPLANT
GLOVE BIOGEL PI IND STRL 9 (GLOVE) ×2 IMPLANT
GLOVE SURG ORTHO 9.0 STRL STRW (GLOVE) ×2 IMPLANT
GOWN STRL REUS W/ TWL XL LVL3 (GOWN DISPOSABLE) ×4 IMPLANT
KIT BASIN OR (CUSTOM PROCEDURE TRAY) ×2 IMPLANT
KIT TURNOVER KIT B (KITS) ×2 IMPLANT
MANIFOLD NEPTUNE II (INSTRUMENTS) ×2 IMPLANT
NS IRRIG 1000ML POUR BTL (IV SOLUTION) ×2 IMPLANT
PACK ORTHO EXTREMITY (CUSTOM PROCEDURE TRAY) ×2 IMPLANT
PAD ARMBOARD 7.5X6 YLW CONV (MISCELLANEOUS) ×4 IMPLANT
SET HNDPC FAN SPRY TIP SCT (DISPOSABLE) IMPLANT
STOCKINETTE IMPERVIOUS 9X36 MD (GAUZE/BANDAGES/DRESSINGS) IMPLANT
SUT ETHILON 2 0 PSLX (SUTURE) ×2 IMPLANT
SWAB COLLECTION DEVICE MRSA (MISCELLANEOUS) ×2 IMPLANT
SWAB CULTURE ESWAB REG 1ML (MISCELLANEOUS) IMPLANT
TOWEL GREEN STERILE (TOWEL DISPOSABLE) ×2 IMPLANT
TUBE CONNECTING 12X1/4 (SUCTIONS) ×2 IMPLANT
YANKAUER SUCT BULB TIP NO VENT (SUCTIONS) ×2 IMPLANT

## 2023-06-07 NOTE — Op Note (Addendum)
 06/07/2023  12:10 PM  PATIENT:  Danny Vargas    PRE-OPERATIVE DIAGNOSIS:  Abscess Left Foot  POST-OPERATIVE DIAGNOSIS:  Same  PROCEDURE: Excisional DEBRIDEMENT LEFT FOOT, with excision of skin soft tissue muscle fascia and bone. Local tissue rearrangement for wound closure 10 x 3 cm. Application of vancomycin  powder 1 g. Tissue sent for cultures.  SURGEON:  Jerona LULLA Sage, MD  PHYSICIAN ASSISTANT: Magdalene Fireman ANESTHESIA: Regional  PREOPERATIVE INDICATIONS:  [name] is a  53 y.o. male with a diagnosis of Abscess Left Foot who failed conservative measures and elected for surgical management.    The risks benefits and alternatives were discussed with the patient preoperatively including but not limited to the risks of infection, bleeding, nerve injury, cardiopulmonary complications, the need for revision surgery, among others, and the patient was willing to proceed.  OPERATIVE IMPLANTS:   * No implants in log *  @ENCIMAGES @  OPERATIVE FINDINGS: Tissue margins were clear.  Patient had involvement of the third metatarsal head and base of the proximal phalanx third toe.  This was resected with the soft tissue and sent for cultures.  OPERATIVE PROCEDURE: Patient was brought the operating room and underwent a regional anesthetic.  After adequate levels anesthesia were obtained patient's left lower extremity was prepped using DuraPrep draped into a sterile field a timeout was called.  Elliptical incision was made around the ulcerative tissue at the second ray amputation.  This was resected through healthy viable margins.  This left a wound that was 10 x 3 cm.  Electrocautery was used hemostasis.  The area of infection involve the third metatarsal head as well as the base of the third toe.  This was resected and the soft tissue in toto was sent for cultures.  The wound was irrigated with Vashe further electrocautery was used for hemostasis.  The wound bed was filled with 1 g vancomycin   powder.  Incision closed using 2-0 nylon a sterile dressing was applied patient was taken the PACU in stable condition.   DISCHARGE PLANNING:  Antibiotic duration: Preoperative antibiotics and topical vancomycin  powder  Weightbearing: Touchdown weightbearing on the left.  Pain medication: Prescription for Percocet  Dressing care/ Wound VAC: Dry dressing  Ambulatory devices: Crutches, postoperative shoe  Discharge to: Home.  Follow-up: In the office 1 week post operative.

## 2023-06-07 NOTE — Anesthesia Preprocedure Evaluation (Addendum)
 Anesthesia Evaluation  Patient identified by MRN, date of birth, ID band Patient awake    Reviewed: Allergy & Precautions, H&P , NPO status , Patient's Chart, lab work & pertinent test results  Airway Mallampati: II  TM Distance: >3 FB Neck ROM: Full    Dental no notable dental hx. (+) Teeth Intact, Dental Advisory Given   Pulmonary former smoker   Pulmonary exam normal breath sounds clear to auscultation       Cardiovascular hypertension, Pt. on medications  Rhythm:Regular Rate:Normal     Neuro/Psych negative neurological ROS  negative psych ROS   GI/Hepatic Neg liver ROS,GERD  ,,  Endo/Other  diabetes, Poorly Controlled, Type 1, Insulin  Dependent    Renal/GU negative Renal ROS  negative genitourinary   Musculoskeletal   Abdominal   Peds  Hematology negative hematology ROS (+)   Anesthesia Other Findings   Reproductive/Obstetrics negative OB ROS                             Anesthesia Physical Anesthesia Plan  ASA: 3  Anesthesia Plan: MAC   Post-op Pain Management: Tylenol  PO (pre-op)*   Induction: Intravenous  PONV Risk Score and Plan: 3 and Ondansetron , Midazolam  and Propofol  infusion  Airway Management Planned: Natural Airway and Simple Face Mask  Additional Equipment:   Intra-op Plan:   Post-operative Plan:   Informed Consent: I have reviewed the patients History and Physical, chart, labs and discussed the procedure including the risks, benefits and alternatives for the proposed anesthesia with the patient or authorized representative who has indicated his/her understanding and acceptance.     Dental advisory given  Plan Discussed with: CRNA  Anesthesia Plan Comments:        Anesthesia Quick Evaluation

## 2023-06-07 NOTE — Transfer of Care (Signed)
 Immediate Anesthesia Transfer of Care Note  Patient: Danny Vargas  Procedure(s) Performed: DEBRIDEMENT LEFT FOOT (Left)  Patient Location: PACU  Anesthesia Type:MAC  Level of Consciousness: drowsy  Airway & Oxygen Therapy: Patient Spontanous Breathing and Patient connected to face mask oxygen  Post-op Assessment: Report given to RN and Post -op Vital signs reviewed and stable  Post vital signs: Reviewed and stable  Last Vitals:  Vitals Value Taken Time  BP 92/59 06/07/23 1220  Temp 36.6 C 06/07/23 1220  Pulse 76 06/07/23 1222  Resp 10 06/07/23 1222  SpO2 100 % 06/07/23 1222  Vitals shown include unfiled device data.  Last Pain:  Vitals:   06/07/23 1220  TempSrc:   PainSc: Asleep         Complications: There were no known notable events for this encounter.

## 2023-06-07 NOTE — Anesthesia Postprocedure Evaluation (Signed)
 Anesthesia Post Note  Patient: Danny Vargas  Procedure(s) Performed: DEBRIDEMENT LEFT FOOT (Left)     Patient location during evaluation: PACU Anesthesia Type: MAC Level of consciousness: awake and alert Pain management: pain level controlled Vital Signs Assessment: post-procedure vital signs reviewed and stable Respiratory status: spontaneous breathing, nonlabored ventilation and respiratory function stable Cardiovascular status: stable and blood pressure returned to baseline Postop Assessment: no apparent nausea or vomiting Anesthetic complications: no  There were no known notable events for this encounter.  Last Vitals:  Vitals:   06/07/23 1300 06/07/23 1315  BP: 131/77 (!) 143/91  Pulse: 77 78  Resp: 10 10  Temp:  36.4 C  SpO2: 98% 99%    Last Pain:  Vitals:   06/07/23 1220  TempSrc:   PainSc: Asleep                 [place],W. EDMOND

## 2023-06-07 NOTE — Progress Notes (Signed)
 Pt reports that his blood sugar was 68 this morning at home at [facility]. Pt reports that he took his insulin  pump off at this time and drank 8oz mountain dew. Pt reports putting his insulin  pump back on at 7am. Upon arrival to short stay pt removed insulin  pump when changing clothes. CBG 248 upon arrival. Pt states that he does not know how to change his basal rate on the pump. Pt states that if he wears his glucose meter, then his insulin  pump will change rates based off of his blood sugar, but he does not currently have a CGM. Dr Epifanio notified. CBG re-checked after an hour. CBG 302. Novolog  insulin  given per order at 10:22. Pt states that he is worried that his CBG will drop. Insulin  re-checked at 11:09- 296. CRNA aware and Dr. Epifanio aware. Handoff given to CRNA

## 2023-06-07 NOTE — H&P (Signed)
 Danny Vargas is an 53 y.o. male.   Chief Complaint: Increased drainage and dehiscence left foot second ray amputation. HPI: Patient is a 53 year old gentleman who presents in follow-up status post left foot second ray amputation approximately 6 weeks out. Patient has completed his oral antibiotics. Patient states he still has odor and drainage and minimizing weightbearing.   Past Medical History:  Diagnosis Date   Diabetes mellitus    GERD (gastroesophageal reflux disease)    HLD (hyperlipidemia) 12/14/2017   HTN (hypertension) 12/14/2017   Marijuana abuse 12/14/2017    Past Surgical History:  Procedure Laterality Date   AMPUTATION TOE Left 04/20/2023   Procedure: LEFT SECOND TOE AMPUTATION;  Surgeon: Harden Jerona GAILS, MD;  Location: [facility] OR;  Service: Orthopedics;  Laterality: Left;   ESOPHAGOGASTRODUODENOSCOPY (EGD) WITH PROPOFOL  N/A 12/16/2017   Procedure: ESOPHAGOGASTRODUODENOSCOPY (EGD) WITH PROPOFOL ;  Surgeon: Celestia Agent, MD;  Location: [facility] ENDOSCOPY;  Service: Endoscopy;  Laterality: N/A;   KNEE SURGERY      Family History  Problem Relation Age of Onset   Diabetes Mother    Social History:  reports that he has been smoking. He has never used smokeless tobacco. He reports that he does not drink alcohol and does not use drugs.  Allergies:  Allergies  Allergen Reactions   Peanut-Containing Drug Products Itching and Rash    Not all peanuts trigger the allergic reaction of itching in the throat and ears    No medications prior to admission.    No results found for this or any previous visit (from the past 48 hours). No results found.  Review of Systems  All other systems reviewed and are negative.   Height 6' 2 (1.88 m), weight 65.8 kg. Physical Exam  Patient is alert, oriented, no adenopathy, well-dressed, normal affect, normal respiratory effort. Examination patient does have pitting edema in the left foot.  There is no cellulitis in the foot he does have a palpable  pulse.  He has maceration around the second ray amputation with clear drainage. Assessment/Plan 1. Cutaneous abscess of left foot       Plan: With the persistent drainage and wound dehiscence will plan for surgical debridement of the infection.  Will send soft tissue for cultures place antibiotic vancomycin  powder within the wound.  Risk and benefits were discussed including need for additional surgery.  Patient states he understands wished to proceed at this time.  Jerona GAILS Harden, MD 06/07/2023, 6:50 AM

## 2023-06-07 NOTE — Interval H&P Note (Signed)
 History and Physical Interval Note:  06/07/2023 11:28 AM  Danny Vargas  has presented today for surgery, with the diagnosis of Abscess Left Foot.  The various methods of treatment have been discussed with the patient and family. After consideration of risks, benefits and other options for treatment, the patient has consented to  Procedure(s): DEBRIDEMENT LEFT FOOT (Left) as a surgical intervention.  The patient's history has been reviewed, patient examined, no change in status, stable for surgery.  I have reviewed the patient's chart and labs.  Questions were answered to the patient's satisfaction.     Sheva Mcdougle V Anastasha Ortez

## 2023-06-08 ENCOUNTER — Encounter (HOSPITAL_COMMUNITY): Payer: Self-pay | Admitting: Orthopedic Surgery

## 2023-06-09 ENCOUNTER — Other Ambulatory Visit: Payer: Self-pay | Admitting: Orthopedic Surgery

## 2023-06-09 LAB — ACID FAST SMEAR (AFB, MYCOBACTERIA): Acid Fast Smear: NEGATIVE

## 2023-06-09 MED ORDER — AMOXICILLIN-POT CLAVULANATE 875-125 MG PO TABS: 1.0000 | ORAL_TABLET | Freq: Two times a day (BID) | ORAL | 0 refills | Status: AC

## 2023-06-11 ENCOUNTER — Telehealth: Payer: Self-pay

## 2023-06-11 LAB — AEROBIC/ANAEROBIC CULTURE W GRAM STAIN (SURGICAL/DEEP WOUND)

## 2023-06-11 NOTE — Telephone Encounter (Signed)
I called and lm on vm [phone] pt's cell phone and advised that rx has been sent to pharm for ABX due to cultures obtained from surgery growing bacteria. Advised also to please call the office so that we can make a post op appt for him this week.

## 2023-06-13 ENCOUNTER — Ambulatory Visit (INDEPENDENT_AMBULATORY_CARE_PROVIDER_SITE_OTHER): Payer: BC Managed Care – PPO | Admitting: Orthopedic Surgery

## 2023-06-13 DIAGNOSIS — L02612 Cutaneous abscess of left foot: Secondary | ICD-10-CM

## 2023-06-14 ENCOUNTER — Encounter: Payer: Self-pay | Admitting: Orthopedic Surgery

## 2023-06-14 NOTE — Progress Notes (Signed)
Office Visit Note   Patient: Danny Vargas           Date of Birth: 18-Oct-1970           MRN: [id number] Visit Date: 06/13/2023              Requested by: Teodoro Spray, MD [address] [place],  [place] [address] PCP: Teodoro Spray, MD  Chief Complaint  Patient presents with   Left Foot - Routine Post Op    06/07/2023 left foot debridement       HPI: Patient is a 53 year old gentleman who is status post left foot debridement 1 week ago.  He is currently on amoxicillin.  He is full weightbearing in a postoperative shoe.  Assessment & Plan: Visit Diagnoses:  1. Cutaneous abscess of left foot     Plan: Patient will try to minimize weightbearing Dial soap cleansing daily follow-up in 1 week to harvest the sutures.  Follow-Up Instructions: Return in about 1 week (around 06/20/2023).   Ortho Exam  Patient is alert, oriented, no adenopathy, well-dressed, normal affect, normal respiratory effort. Examination patient has excellent initial healing.  There is no cellulitis no drainage the wound edges are well-approximated.  Imaging: No results found. No images are attached to the encounter.  Labs: Lab Results  Component Value Date   HGBA1C 9.6 (H) 04/19/2023   HGBA1C 9.2 (H) 08/19/2020   HGBA1C 10.9 (H) 12/01/2017   ESRSEDRATE 80 (H) 04/18/2023   CRP 21.3 (H) 04/18/2023   REPTSTATUS 06/11/2023 FINAL 06/07/2023   GRAMSTAIN  06/07/2023    ABUNDANT WBC PRESENT, PREDOMINANTLY PMN ABUNDANT GRAM POSITIVE COCCI RARE GRAM NEGATIVE RODS    CULT  06/07/2023    ABUNDANT ENTEROCOCCUS FAECALIS VANCOMYCIN RESISTANT ENTEROCOCCUS ISOLATED MODERATE PREVOTELLA SPECIES BETA LACTAMASE POSITIVE ABUNDANT CORYNEBACTERIUM STRIATUM Standardized susceptibility testing for this organism is not available. Performed at [facility], [address], [place], [place] [address]    [facility] ENTEROCOCCUS FAECALIS 06/07/2023     Lab Results  Component Value Date   ALBUMIN 2.9 (L)  04/18/2023   ALBUMIN 3.5 08/21/2020   ALBUMIN 3.4 (L) 08/20/2020   PREALBUMIN <5 (L) 04/19/2023    Lab Results  Component Value Date   MG 1.8 08/22/2020   MG 1.9 08/21/2020   MG 2.0 08/20/2020   No results found for: "VD25OH"  Lab Results  Component Value Date   PREALBUMIN <5 (L) 04/19/2023      Latest Ref Rng & Units 04/23/2023    5:36 AM 04/21/2023    6:12 AM 04/20/2023    5:28 AM  CBC EXTENDED  WBC 4.0 - 10.5 K/uL 14.7  21.4  19.2   RBC 4.22 - 5.81 MIL/uL 4.14  4.20  4.14   Hemoglobin 13.0 - 17.0 g/dL 82.9  56.2  13.0   HCT 39.0 - 52.0 % 34.4  34.6  34.2   Platelets 150 - 400 K/uL 528  489  493   NEUT# 1.7 - 7.7 K/uL 10.6     Lymph# 0.7 - 4.0 K/uL 2.2        There is no height or weight on file to calculate BMI.  Orders:  No orders of the defined types were placed in this encounter.  No orders of the defined types were placed in this encounter.    Procedures: No procedures performed  Clinical Data: No additional findings.  ROS:  All other systems negative, except as noted in the HPI. Review of Systems  Objective: Vital Signs: There were no vitals taken for this visit.  Specialty Comments:  No specialty comments available.  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 abuse (HCC) 08/19/2020   Uncontrolled type 2 diabetes mellitus with hyperglycemia, with long-term current use of insulin (HCC) 08/19/2020   Acute kidney injury (nontraumatic) (HCC)    Intractable vomiting 08/18/2020   Esophagitis 12/16/2017   Hematemesis 12/14/2017   HTN (hypertension) 12/14/2017   HLD (hyperlipidemia) 12/14/2017   Marijuana abuse 12/14/2017   Upper GI bleed 12/01/2017    Gastroparesis due to DM (HCC) 12/01/2017   Leukocytosis 12/01/2017   Diabetes (HCC) 10/28/2013   GERD (gastroesophageal reflux disease) 10/28/2013   Nausea & vomiting 10/28/2013   DKA (diabetic ketoacidosis) (HCC) 10/28/2013   Past Medical History:  Diagnosis Date   Diabetes mellitus    GERD (gastroesophageal reflux disease)    HLD (hyperlipidemia) 12/14/2017   HTN (hypertension) 12/14/2017   Marijuana abuse 12/14/2017    Family History  Problem Relation Age of Onset   Diabetes Mother     Past Surgical History:  Procedure Laterality Date   AMPUTATION TOE Left 04/20/2023   Procedure: LEFT SECOND TOE AMPUTATION;  Surgeon: Nadara Mustard, MD;  Location: [facility] OR;  Service: Orthopedics;  Laterality: Left;   ESOPHAGOGASTRODUODENOSCOPY (EGD) WITH PROPOFOL N/A 12/16/2017   Procedure: ESOPHAGOGASTRODUODENOSCOPY (EGD) WITH PROPOFOL;  Surgeon: Carman Ching, MD;  Location: [facility] ENDOSCOPY;  Service: Endoscopy;  Laterality: N/A;   I & D EXTREMITY Left 06/07/2023   Procedure: DEBRIDEMENT LEFT FOOT;  Surgeon: Nadara Mustard, MD;  Location: [facility] OR;  Service: Orthopedics;  Laterality: Left;   KNEE SURGERY     Social History   Occupational History   Not on file  Tobacco Use   Smoking status: Former    Current packs/day: 1.00    Types: Cigarettes   Smokeless tobacco: Never   Tobacco comments:    Has not smoked since 2015  Vaping Use   Vaping status: Never Used  Substance and Sexual Activity   Alcohol use: No   Drug use: No   Sexual activity: Yes

## 2023-06-20 ENCOUNTER — Encounter: Payer: BC Managed Care – PPO | Admitting: Orthopedic Surgery

## 2023-06-27 ENCOUNTER — Ambulatory Visit (INDEPENDENT_AMBULATORY_CARE_PROVIDER_SITE_OTHER): Payer: BC Managed Care – PPO | Admitting: Orthopedic Surgery

## 2023-06-27 DIAGNOSIS — I96 Gangrene, not elsewhere classified: Secondary | ICD-10-CM

## 2023-06-27 DIAGNOSIS — L02612 Cutaneous abscess of left foot: Secondary | ICD-10-CM

## 2023-06-27 MED ORDER — OXYCODONE-ACETAMINOPHEN 5-325 MG PO TABS: 1.0000 | ORAL_TABLET | ORAL | 0 refills | Status: AC | PRN

## 2023-07-01 ENCOUNTER — Encounter: Payer: Self-pay | Admitting: Orthopedic Surgery

## 2023-07-01 NOTE — Progress Notes (Signed)
 Office Visit Note   Patient: Danny Vargas           Date of Birth: 12-May-1970           MRN: [id number] Visit Date: 06/27/2023              Requested by: Teodoro Spray, MD [address] [place],  [place] [address] PCP: Teodoro Spray, MD  Chief Complaint  Patient presents with   Left Foot - Routine Post Op    06/07/2023 left foot debridement      HPI: Patient is a 53 year old gentleman who is 3 weeks status post left foot debridement.  Patient is currently on Augmentin.  Patient states that his dog knocked down his antibiotics and pain pills and he is not sure if the dog ate them.  Assessment & Plan: Visit Diagnoses:  1. Cutaneous abscess of left foot   2. Gangrene of toe of left foot (HCC)     Plan: Prescription was provided for Percocet.  Sutures harvested.  Follow-Up Instructions: Return in about 4 weeks (around 07/25/2023).   Ortho Exam  Patient is alert, oriented, no adenopathy, well-dressed, normal affect, normal respiratory effort. Examination the incision is well-approximated there is no cellulitis no drainage no signs of infection.  Sutures harvested.  Second ray amputation is well-healed.  There is some swelling of the third toe without cellulitis.  Imaging: No results found. No images are attached to the encounter.  Labs: Lab Results  Component Value Date   HGBA1C 9.6 (H) 04/19/2023   HGBA1C 9.2 (H) 08/19/2020   HGBA1C 10.9 (H) 12/01/2017   ESRSEDRATE 80 (H) 04/18/2023   CRP 21.3 (H) 04/18/2023   REPTSTATUS 06/11/2023 FINAL 06/07/2023   GRAMSTAIN  06/07/2023    ABUNDANT WBC PRESENT, PREDOMINANTLY PMN ABUNDANT GRAM POSITIVE COCCI RARE GRAM NEGATIVE RODS    CULT  06/07/2023    ABUNDANT ENTEROCOCCUS FAECALIS VANCOMYCIN RESISTANT ENTEROCOCCUS ISOLATED MODERATE PREVOTELLA SPECIES BETA LACTAMASE POSITIVE ABUNDANT CORYNEBACTERIUM STRIATUM Standardized susceptibility testing for this organism is not available. Performed at [facility],  [address], [place], [place] [address]    [facility] ENTEROCOCCUS FAECALIS 06/07/2023     Lab Results  Component Value Date   ALBUMIN 2.9 (L) 04/18/2023   ALBUMIN 3.5 08/21/2020   ALBUMIN 3.4 (L) 08/20/2020   PREALBUMIN <5 (L) 04/19/2023    Lab Results  Component Value Date   MG 1.8 08/22/2020   MG 1.9 08/21/2020   MG 2.0 08/20/2020   No results found for: "VD25OH"  Lab Results  Component Value Date   PREALBUMIN <5 (L) 04/19/2023      Latest Ref Rng & Units 04/23/2023    5:36 AM 04/21/2023    6:12 AM 04/20/2023    5:28 AM  CBC EXTENDED  WBC 4.0 - 10.5 K/uL 14.7  21.4  19.2   RBC 4.22 - 5.81 MIL/uL 4.14  4.20  4.14   Hemoglobin 13.0 - 17.0 g/dL 13.2  44.0  10.2   HCT 39.0 - 52.0 % 34.4  34.6  34.2   Platelets 150 - 400 K/uL 528  489  493   NEUT# 1.7 - 7.7 K/uL 10.6     Lymph# 0.7 - 4.0 K/uL 2.2        There is no height or weight on file to calculate BMI.  Orders:  No orders of the defined types were placed in this encounter.  Meds ordered this encounter  Medications   oxyCODONE-acetaminophen (PERCOCET/ROXICET) 5-325 MG  tablet    Sig: Take 1 tablet by mouth every 4 (four) hours as needed.    Dispense:  30 tablet    Refill:  0     Procedures: No procedures performed  Clinical Data: No additional findings.  ROS:  All other systems negative, except as noted in the HPI. Review of Systems  Objective: Vital Signs: There were no vitals taken for this visit.  Specialty Comments:  No specialty comments available.  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 abuse (HCC) 08/19/2020   Uncontrolled type 2 diabetes mellitus with hyperglycemia, with long-term current use of  insulin (HCC) 08/19/2020   Acute kidney injury (nontraumatic) (HCC)    Intractable vomiting 08/18/2020   Esophagitis 12/16/2017   Hematemesis 12/14/2017   HTN (hypertension) 12/14/2017   HLD (hyperlipidemia) 12/14/2017   Marijuana abuse 12/14/2017   Upper GI bleed 12/01/2017   Gastroparesis due to DM (HCC) 12/01/2017   Leukocytosis 12/01/2017   Diabetes (HCC) 10/28/2013   GERD (gastroesophageal reflux disease) 10/28/2013   Nausea & vomiting 10/28/2013   DKA (diabetic ketoacidosis) (HCC) 10/28/2013   Past Medical History:  Diagnosis Date   Diabetes mellitus    GERD (gastroesophageal reflux disease)    HLD (hyperlipidemia) 12/14/2017   HTN (hypertension) 12/14/2017   Marijuana abuse 12/14/2017    Family History  Problem Relation Age of Onset   Diabetes Mother     Past Surgical History:  Procedure Laterality Date   AMPUTATION TOE Left 04/20/2023   Procedure: LEFT SECOND TOE AMPUTATION;  Surgeon: Nadara Mustard, MD;  Location: [facility] OR;  Service: Orthopedics;  Laterality: Left;   ESOPHAGOGASTRODUODENOSCOPY (EGD) WITH PROPOFOL N/A 12/16/2017   Procedure: ESOPHAGOGASTRODUODENOSCOPY (EGD) WITH PROPOFOL;  Surgeon: Carman Ching, MD;  Location: [facility] ENDOSCOPY;  Service: Endoscopy;  Laterality: N/A;   I & D EXTREMITY Left 06/07/2023   Procedure: DEBRIDEMENT LEFT FOOT;  Surgeon: Nadara Mustard, MD;  Location: [facility] OR;  Service: Orthopedics;  Laterality: Left;   KNEE SURGERY     Social History   Occupational History   Not on file  Tobacco Use   Smoking status: Former    Current packs/day: 1.00    Types: Cigarettes   Smokeless tobacco: Never   Tobacco comments:    Has not smoked since 2015  Vaping Use   Vaping status: Never Used  Substance and Sexual Activity   Alcohol use: No   Drug use: No   Sexual activity: Yes

## 2023-07-09 LAB — FUNGUS CULTURE RESULT

## 2023-07-09 LAB — FUNGUS CULTURE WITH STAIN

## 2023-07-09 LAB — FUNGAL ORGANISM REFLEX

## 2023-07-22 LAB — ACID FAST CULTURE WITH REFLEXED SENSITIVITIES (MYCOBACTERIA): Acid Fast Culture: NEGATIVE

## 2023-07-29 ENCOUNTER — Ambulatory Visit: Payer: BC Managed Care – PPO | Admitting: Orthopedic Surgery

## 2023-07-29 DIAGNOSIS — I96 Gangrene, not elsewhere classified: Secondary | ICD-10-CM

## 2023-07-29 DIAGNOSIS — L02612 Cutaneous abscess of left foot: Secondary | ICD-10-CM

## 2023-07-30 ENCOUNTER — Encounter: Payer: Self-pay | Admitting: Orthopedic Surgery

## 2023-07-30 NOTE — Progress Notes (Signed)
 Office Visit Note   Patient: Danny Vargas           Date of Birth: 1971/04/01           MRN: [id number] Visit Date: 07/29/2023              Requested by: Teodoro Spray, MD [address] [place],  [place] [address] PCP: Teodoro Spray, MD  Chief Complaint  Patient presents with   Left Foot - Routine Post Op    06/07/2023 left foot debridement       HPI: Patient is a 53 year old gentleman who is status post debridement abscess left foot February 7.  Patient states he feels well has no concerns.  Patient is 6 weeks out from surgery.  Assessment & Plan: Visit Diagnoses:  1. Cutaneous abscess of left foot   2. Gangrene of toe of left foot (HCC)     Plan: Patient is provided a note to return to work.  Increase activities as tolerated.  Follow-Up Instructions: Return if symptoms worsen or fail to improve.   Ortho Exam  Patient is alert, oriented, no adenopathy, well-dressed, normal affect, normal respiratory effort. Examination the wound is completely healed there is some swelling.  There is no cellulitis no drainage.  Imaging: No results found. No images are attached to the encounter.  Labs: Lab Results  Component Value Date   HGBA1C 9.6 (H) 04/19/2023   HGBA1C 9.2 (H) 08/19/2020   HGBA1C 10.9 (H) 12/01/2017   ESRSEDRATE 80 (H) 04/18/2023   CRP 21.3 (H) 04/18/2023   REPTSTATUS 06/11/2023 FINAL 06/07/2023   GRAMSTAIN  06/07/2023    ABUNDANT WBC PRESENT, PREDOMINANTLY PMN ABUNDANT GRAM POSITIVE COCCI RARE GRAM NEGATIVE RODS    CULT  06/07/2023    ABUNDANT ENTEROCOCCUS FAECALIS VANCOMYCIN RESISTANT ENTEROCOCCUS ISOLATED MODERATE PREVOTELLA SPECIES BETA LACTAMASE POSITIVE ABUNDANT CORYNEBACTERIUM STRIATUM Standardized susceptibility testing for this organism is not available. Performed at [facility], [address] [place], [place] [address]    [facility] ENTEROCOCCUS FAECALIS 06/07/2023     Lab Results  Component Value Date   ALBUMIN 2.9 (L)  04/18/2023   ALBUMIN 3.5 08/21/2020   ALBUMIN 3.4 (L) 08/20/2020   PREALBUMIN <5 (L) 04/19/2023    Lab Results  Component Value Date   MG 1.8 08/22/2020   MG 1.9 08/21/2020   MG 2.0 08/20/2020   No results found for: "VD25OH"  Lab Results  Component Value Date   PREALBUMIN <5 (L) 04/19/2023      Latest Ref Rng & Units 04/23/2023    5:36 AM 04/21/2023    6:12 AM 04/20/2023    5:28 AM  CBC EXTENDED  WBC 4.0 - 10.5 K/uL 14.7  21.4  19.2   RBC 4.22 - 5.81 MIL/uL 4.14  4.20  4.14   Hemoglobin 13.0 - 17.0 g/dL 86.5  78.4  69.6   HCT 39.0 - 52.0 % 34.4  34.6  34.2   Platelets 150 - 400 K/uL 528  489  493   NEUT# 1.7 - 7.7 K/uL 10.6     Lymph# 0.7 - 4.0 K/uL 2.2        There is no height or weight on file to calculate BMI.  Orders:  No orders of the defined types were placed in this encounter.  No orders of the defined types were placed in this encounter.    Procedures: No procedures performed  Clinical Data: No additional findings.  ROS:  All other systems negative, except as  noted in the HPI. Review of Systems  Objective: Vital Signs: There were no vitals taken for this visit.  Specialty Comments:  No specialty comments available.  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 abuse (HCC) 08/19/2020   Uncontrolled type 2 diabetes mellitus with hyperglycemia, with long-term current use of insulin (HCC) 08/19/2020   Acute kidney injury (nontraumatic) (HCC)    Intractable vomiting 08/18/2020   Esophagitis 12/16/2017   Hematemesis 12/14/2017   HTN (hypertension) 12/14/2017   HLD (hyperlipidemia) 12/14/2017   Marijuana abuse 12/14/2017   Upper GI bleed 12/01/2017    Gastroparesis due to DM (HCC) 12/01/2017   Leukocytosis 12/01/2017   Diabetes (HCC) 10/28/2013   GERD (gastroesophageal reflux disease) 10/28/2013   Nausea & vomiting 10/28/2013   DKA (diabetic ketoacidosis) (HCC) 10/28/2013   Past Medical History:  Diagnosis Date   Diabetes mellitus    GERD (gastroesophageal reflux disease)    HLD (hyperlipidemia) 12/14/2017   HTN (hypertension) 12/14/2017   Marijuana abuse 12/14/2017    Family History  Problem Relation Age of Onset   Diabetes Mother     Past Surgical History:  Procedure Laterality Date   AMPUTATION TOE Left 04/20/2023   Procedure: LEFT SECOND TOE AMPUTATION;  Surgeon: Nadara Mustard, MD;  Location: [facility] OR;  Service: Orthopedics;  Laterality: Left;   ESOPHAGOGASTRODUODENOSCOPY (EGD) WITH PROPOFOL N/A 12/16/2017   Procedure: ESOPHAGOGASTRODUODENOSCOPY (EGD) WITH PROPOFOL;  Surgeon: Carman Ching, MD;  Location: [facility] ENDOSCOPY;  Service: Endoscopy;  Laterality: N/A;   I & D EXTREMITY Left 06/07/2023   Procedure: DEBRIDEMENT LEFT FOOT;  Surgeon: Nadara Mustard, MD;  Location: [facility];  Service: Orthopedics;  Laterality: Left;   KNEE SURGERY     Social History   Occupational History   Not on file  Tobacco Use   Smoking status: Former    Current packs/day: 1.00    Types: Cigarettes   Smokeless tobacco: Never   Tobacco comments:    Has not smoked since 2015  Vaping Use   Vaping status: Never Used  Substance and Sexual Activity   Alcohol use: No   Drug use: No   Sexual activity: Yes
